# Patient Record
Sex: Female | Born: 1980 | Race: Black or African American | Hispanic: No | Marital: Single | Smoking: Never smoker
Health system: Southern US, Community
[De-identification: ages and names within clinical notes are randomized; demographics above are authoritative.]

## PROBLEM LIST (undated history)

## (undated) ENCOUNTER — Inpatient Hospital Stay (HOSPITAL_COMMUNITY): Payer: Self-pay

## (undated) DIAGNOSIS — O24419 Gestational diabetes mellitus in pregnancy, unspecified control: Secondary | ICD-10-CM

## (undated) DIAGNOSIS — B999 Unspecified infectious disease: Secondary | ICD-10-CM

## (undated) DIAGNOSIS — F32A Depression, unspecified: Secondary | ICD-10-CM

## (undated) DIAGNOSIS — G43909 Migraine, unspecified, not intractable, without status migrainosus: Secondary | ICD-10-CM

## (undated) DIAGNOSIS — R519 Headache, unspecified: Secondary | ICD-10-CM

## (undated) DIAGNOSIS — L309 Dermatitis, unspecified: Secondary | ICD-10-CM

## (undated) DIAGNOSIS — D219 Benign neoplasm of connective and other soft tissue, unspecified: Secondary | ICD-10-CM

## (undated) DIAGNOSIS — N390 Urinary tract infection, site not specified: Secondary | ICD-10-CM

## (undated) DIAGNOSIS — I10 Essential (primary) hypertension: Secondary | ICD-10-CM

## (undated) DIAGNOSIS — D573 Sickle-cell trait: Secondary | ICD-10-CM

## (undated) HISTORY — PX: BREAST SURGERY: SHX581

## (undated) HISTORY — DX: Gestational diabetes mellitus in pregnancy, unspecified control: O24.419

## (undated) HISTORY — DX: Urinary tract infection, site not specified: N39.0

## (undated) HISTORY — PX: DILATION AND CURETTAGE OF UTERUS: SHX78

## (undated) HISTORY — DX: Sickle-cell trait: D57.3

---

## 2002-07-05 ENCOUNTER — Emergency Department (HOSPITAL_COMMUNITY): Admission: EM | Admit: 2002-07-05 | Discharge: 2002-07-05 | Payer: Self-pay | Admitting: Emergency Medicine

## 2002-07-05 ENCOUNTER — Encounter: Payer: Self-pay | Admitting: Emergency Medicine

## 2003-02-24 ENCOUNTER — Emergency Department (HOSPITAL_COMMUNITY): Admission: AD | Admit: 2003-02-24 | Discharge: 2003-02-24 | Payer: Self-pay | Admitting: Emergency Medicine

## 2005-04-12 ENCOUNTER — Emergency Department (HOSPITAL_COMMUNITY): Admission: EM | Admit: 2005-04-12 | Discharge: 2005-04-13 | Payer: Self-pay | Admitting: Emergency Medicine

## 2007-01-03 ENCOUNTER — Emergency Department (HOSPITAL_COMMUNITY): Admission: EM | Admit: 2007-01-03 | Discharge: 2007-01-04 | Payer: Self-pay | Admitting: Emergency Medicine

## 2008-03-19 ENCOUNTER — Other Ambulatory Visit: Admission: RE | Admit: 2008-03-19 | Discharge: 2008-03-19 | Payer: Self-pay | Admitting: Gynecology

## 2008-04-03 ENCOUNTER — Emergency Department (HOSPITAL_COMMUNITY): Admission: EM | Admit: 2008-04-03 | Discharge: 2008-04-03 | Payer: Self-pay | Admitting: Emergency Medicine

## 2009-01-27 ENCOUNTER — Ambulatory Visit: Payer: Self-pay | Admitting: Women's Health

## 2009-02-06 ENCOUNTER — Inpatient Hospital Stay (HOSPITAL_COMMUNITY): Admission: AD | Admit: 2009-02-06 | Discharge: 2009-02-06 | Payer: Self-pay | Admitting: Obstetrics & Gynecology

## 2009-05-31 ENCOUNTER — Inpatient Hospital Stay (HOSPITAL_COMMUNITY): Admission: AD | Admit: 2009-05-31 | Discharge: 2009-05-31 | Payer: Self-pay | Admitting: Obstetrics and Gynecology

## 2009-07-12 ENCOUNTER — Inpatient Hospital Stay (HOSPITAL_COMMUNITY): Admission: AD | Admit: 2009-07-12 | Discharge: 2009-07-12 | Payer: Self-pay | Admitting: Obstetrics & Gynecology

## 2009-09-10 ENCOUNTER — Inpatient Hospital Stay (HOSPITAL_COMMUNITY): Admission: AD | Admit: 2009-09-10 | Discharge: 2009-09-12 | Payer: Self-pay | Admitting: Obstetrics & Gynecology

## 2010-04-27 ENCOUNTER — Emergency Department (HOSPITAL_COMMUNITY): Admission: EM | Admit: 2010-04-27 | Discharge: 2010-04-28 | Payer: Self-pay | Admitting: Emergency Medicine

## 2010-10-27 LAB — URINALYSIS, ROUTINE W REFLEX MICROSCOPIC
Bilirubin Urine: NEGATIVE
Glucose, UA: NEGATIVE mg/dL
Hgb urine dipstick: NEGATIVE
Ketones, ur: 15 mg/dL — AB
Leukocytes, UA: NEGATIVE
Nitrite: NEGATIVE
Protein, ur: 30 mg/dL — AB
Specific Gravity, Urine: 1.014 (ref 1.005–1.030)
Urobilinogen, UA: 0.2 mg/dL (ref 0.0–1.0)
pH: 8 (ref 5.0–8.0)

## 2010-10-27 LAB — POCT I-STAT, CHEM 8
BUN: 8 mg/dL (ref 6–23)
Calcium, Ion: 1.16 mmol/L (ref 1.12–1.32)
Chloride: 103 mEq/L (ref 96–112)
Creatinine, Ser: 0.6 mg/dL (ref 0.4–1.2)
Glucose, Bld: 129 mg/dL — ABNORMAL HIGH (ref 70–99)
HCT: 46 % (ref 36.0–46.0)
Hemoglobin: 15.6 g/dL — ABNORMAL HIGH (ref 12.0–15.0)
Potassium: 4 mEq/L (ref 3.5–5.1)
Sodium: 139 mEq/L (ref 135–145)
TCO2: 25 mmol/L (ref 0–100)

## 2010-10-27 LAB — URINE MICROSCOPIC-ADD ON

## 2010-10-27 LAB — POCT PREGNANCY, URINE: Preg Test, Ur: NEGATIVE

## 2010-10-30 LAB — CBC
HCT: 39.5 % (ref 36.0–46.0)
Hemoglobin: 11.6 g/dL — ABNORMAL LOW (ref 12.0–15.0)
MCHC: 33.7 g/dL (ref 30.0–36.0)
MCV: 94.7 fL (ref 78.0–100.0)
MCV: 95.8 fL (ref 78.0–100.0)
Platelets: 172 10*3/uL (ref 150–400)
RBC: 3.6 MIL/uL — ABNORMAL LOW (ref 3.87–5.11)
WBC: 6.9 10*3/uL (ref 4.0–10.5)

## 2010-11-16 LAB — URINALYSIS, ROUTINE W REFLEX MICROSCOPIC
Bilirubin Urine: NEGATIVE
Glucose, UA: NEGATIVE mg/dL
Ketones, ur: NEGATIVE mg/dL
Protein, ur: NEGATIVE mg/dL

## 2010-11-16 LAB — URINE MICROSCOPIC-ADD ON

## 2010-11-16 LAB — WET PREP, GENITAL: Trich, Wet Prep: NONE SEEN

## 2010-11-16 LAB — GC/CHLAMYDIA PROBE AMP, GENITAL: GC Probe Amp, Genital: NEGATIVE

## 2010-11-21 LAB — URINE MICROSCOPIC-ADD ON: RBC / HPF: NONE SEEN RBC/hpf (ref <3)

## 2010-11-21 LAB — BASIC METABOLIC PANEL
Calcium: 8.9 mg/dL (ref 8.4–10.5)
Creatinine, Ser: 0.64 mg/dL (ref 0.4–1.2)
GFR calc Af Amer: >60 mL/min (ref >60)
GFR calc non Af Amer: >60 mL/min (ref >60)
Glucose, Bld: 93 mg/dL (ref 70–99)
Sodium: 133 mEq/L — ABNORMAL LOW (ref 135–145)

## 2010-11-21 LAB — URINALYSIS, ROUTINE W REFLEX MICROSCOPIC
Glucose, UA: NEGATIVE mg/dL
Hgb urine dipstick: NEGATIVE
Ketones, ur: NEGATIVE mg/dL
Protein, ur: 100 mg/dL — AB

## 2010-11-21 LAB — GC/CHLAMYDIA PROBE AMP, GENITAL
Chlamydia, DNA Probe: NEGATIVE
GC Probe Amp, Genital: NEGATIVE

## 2010-11-21 LAB — WET PREP, GENITAL: Yeast Wet Prep HPF POC: NONE SEEN

## 2010-11-21 LAB — CBC
Hemoglobin: 12.6 g/dL (ref 12.0–15.0)
RDW: 13.6 % (ref 11.5–15.5)

## 2020-08-14 NOTE — L&D Delivery Note (Addendum)
OB/GYN Faculty Practice Delivery Note  Tammy Mcgrath is a 40 y.o. G3P1011 s/p VD at [redacted]w[redacted]d. She was admitted for IOL due to HTN.   ROM: 2h 32m with bloody fluid GBS Status: Negative/-- (11/03 0849) Maximum Maternal Temperature: 98.9  Labor Progress: Patient presented to L&D for IOL for HTN. Initial SVE: 0.5/thick/-3.Was given cytotec and pitocin and was AROMed and then progressed to complete.   Delivery Date/Time: 06/22/21 @0856  Delivery: Called to room and patient was complete and pushing. Head delivered LOA. Double nuchal cord present with single loop relieved and delivered through 2nd loop. Shoulder and body delivered in usual fashion. Infant with spontaneous cry, placed on mother's abdomen, dried and stimulated. Cord clamped x 2 after 1-minute delay, and cut by father of baby. Cord blood drawn. Placenta delivered spontaneously with gentle cord traction. Fundus firm with massage and Pitocin. Labia, perineum, vagina, and cervix inspected with bilateral periurethral lacerations and 1st degree perineal laceration which required repair. Placenta was examined after and found to be intact with normal 3-vessel cord.   Placenta: Intact 3VC Complications: None Lacerations: 1st degree perineal repaired with 3-0 Vicryl figure of eight suture, bilateral periurethral abrasions with left side repaired using 4-0 monocryl simple sutures EBL: Analgesia: Epidural   Infant: APGARs 8/9  pending weight  10/9, DO 06/22/2021, 9:50 AM PGY-1, [facility]  GME ATTESTATION:  I saw and evaluated the patient. I agree with the findings and the plan of care as documented in the resident's note and have made all necessary edits.  13/04/2021, MD, MPH OB Fellow, Faculty Practice [facility], Center for [facility] 06/22/2021 10:08 AM

## 2020-11-07 ENCOUNTER — Encounter (HOSPITAL_COMMUNITY): Payer: Self-pay | Admitting: Emergency Medicine

## 2020-11-07 ENCOUNTER — Inpatient Hospital Stay (HOSPITAL_COMMUNITY)
Admission: EM | Admit: 2020-11-07 | Discharge: 2020-11-07 | Disposition: A | Discharge status: Home / Self Care | Payer: Medicaid - Out of State | Attending: Obstetrics & Gynecology | Admitting: Obstetrics & Gynecology

## 2020-11-07 ENCOUNTER — Other Ambulatory Visit: Payer: Self-pay

## 2020-11-07 ENCOUNTER — Inpatient Hospital Stay (HOSPITAL_COMMUNITY): Payer: Medicaid - Out of State

## 2020-11-07 DIAGNOSIS — O23591 Infection of other part of genital tract in pregnancy, first trimester: Secondary | ICD-10-CM | POA: Diagnosis not present

## 2020-11-07 DIAGNOSIS — O4691 Antepartum hemorrhage, unspecified, first trimester: Secondary | ICD-10-CM

## 2020-11-07 DIAGNOSIS — R109 Unspecified abdominal pain: Secondary | ICD-10-CM | POA: Diagnosis present

## 2020-11-07 DIAGNOSIS — Z6790 Unspecified blood type, Rh positive: Secondary | ICD-10-CM | POA: Diagnosis not present

## 2020-11-07 DIAGNOSIS — O99331 Smoking (tobacco) complicating pregnancy, first trimester: Secondary | ICD-10-CM | POA: Insufficient documentation

## 2020-11-07 DIAGNOSIS — O209 Hemorrhage in early pregnancy, unspecified: Secondary | ICD-10-CM | POA: Diagnosis present

## 2020-11-07 DIAGNOSIS — B9689 Other specified bacterial agents as the cause of diseases classified elsewhere: Secondary | ICD-10-CM | POA: Insufficient documentation

## 2020-11-07 DIAGNOSIS — O30041 Twin pregnancy, dichorionic/diamniotic, first trimester: Secondary | ICD-10-CM | POA: Insufficient documentation

## 2020-11-07 DIAGNOSIS — Z3A08 8 weeks gestation of pregnancy: Secondary | ICD-10-CM | POA: Insufficient documentation

## 2020-11-07 DIAGNOSIS — O09521 Supervision of elderly multigravida, first trimester: Secondary | ICD-10-CM | POA: Diagnosis not present

## 2020-11-07 DIAGNOSIS — F17200 Nicotine dependence, unspecified, uncomplicated: Secondary | ICD-10-CM | POA: Diagnosis not present

## 2020-11-07 DIAGNOSIS — O4690 Antepartum hemorrhage, unspecified, unspecified trimester: Secondary | ICD-10-CM

## 2020-11-07 DIAGNOSIS — N760 Acute vaginitis: Secondary | ICD-10-CM | POA: Diagnosis not present

## 2020-11-07 LAB — URINALYSIS, ROUTINE W REFLEX MICROSCOPIC
Bilirubin Urine: NEGATIVE
Glucose, UA: NEGATIVE mg/dL
Ketones, ur: NEGATIVE mg/dL
Nitrite: NEGATIVE
Protein, ur: NEGATIVE mg/dL
Specific Gravity, Urine: 1.012 (ref 1.005–1.030)
pH: 5 (ref 5.0–8.0)

## 2020-11-07 LAB — CBC
HCT: 35.4 % — ABNORMAL LOW (ref 36.0–46.0)
Hemoglobin: 12.1 g/dL (ref 12.0–15.0)
MCH: 27.2 pg (ref 26.0–34.0)
MCHC: 34.2 g/dL (ref 30.0–36.0)
MCV: 79.6 fL — ABNORMAL LOW (ref 80.0–100.0)
Platelets: 259 10*3/uL (ref 150–400)
RBC: 4.45 MIL/uL (ref 3.87–5.11)
RDW: 15.9 % — ABNORMAL HIGH (ref 11.5–15.5)
WBC: 5 10*3/uL (ref 4.0–10.5)
nRBC: 0 % (ref 0.0–0.2)

## 2020-11-07 LAB — WET PREP, GENITAL
Sperm: NONE SEEN
Trich, Wet Prep: NONE SEEN
Yeast Wet Prep HPF POC: NONE SEEN

## 2020-11-07 LAB — HCG, QUANTITATIVE, PREGNANCY: hCG, Beta Chain, Quant, S: 7691 m[IU]/mL — ABNORMAL HIGH (ref <5)

## 2020-11-07 LAB — I-STAT BETA HCG BLOOD, ED (MC, WL, AP ONLY): I-stat hCG, quantitative: >2000 m[IU]/mL — ABNORMAL HIGH (ref <5)

## 2020-11-07 MED ORDER — METRONIDAZOLE 500 MG PO TABS: 500.0000 mg | ORAL_TABLET | Freq: Two times a day (BID) | ORAL | 0 refills | Status: DC

## 2020-11-07 NOTE — ED Triage Notes (Signed)
Pt. Stated, I was positive for being pregnant and last night started spotting and having abdominal cramping. Per pt and boyfriend she is hard of hearing. Boyfriend with her.

## 2020-11-07 NOTE — MAU Note (Signed)
Pt reports to mau with c/o brown vag dc and itching. Also reports lower abd cramping.

## 2020-11-07 NOTE — MAU Provider Note (Signed)
History     CSN: [id number]  Arrival date and time: 11/07/20 3220  Chief Complaint  Patient presents with  . Vaginal Bleeding  . Abdominal Pain  . Abdominal Cramping   40 y.o. G2P1001 @[redacted]w[redacted]d  by LMP presenting with with vaginal discharge and abd cramping. Reports onset last night. Describes discharge as brown and small amt. No recent IC. Denies vaginal malodor but reports itching she thinks is eczema. Cramping located in lower abdomen, intermittent and rates 3/10. Has not taken anything for it.   OB History    Gravida  2   Para  1   Term  1   Preterm      AB      Living        SAB      IAB      Ectopic      Multiple      Live Births              History reviewed. No pertinent past medical history.  History reviewed. No pertinent surgical history.  History reviewed. No pertinent family history.  Social History   Tobacco Use  . Smoking status: Current Every Day Smoker  . Smokeless tobacco: Never Used  Substance Use Topics  . Alcohol use: Yes  . Drug use: Not Currently    Allergies: Not on File  Medications Prior to Admission  Medication Sig Dispense Refill Last Dose  . Prenatal Vit-Fe Fumarate-FA (MULTIVITAMIN-PRENATAL) 27-0.8 MG TABS tablet Take 1 tablet by mouth daily at 12 noon.   11/06/2020 at Unknown time    Review of Systems  Constitutional: Negative for chills and fever.  Gastrointestinal: Positive for abdominal pain. Negative for constipation, diarrhea, nausea and vomiting.  Genitourinary: Positive for vaginal bleeding and vaginal discharge. Negative for dysuria, frequency and urgency.   Physical Exam   Blood pressure (!) 137/93, pulse 82, temperature 98.4 F (36.9 C), resp. rate 16, last menstrual period 09/09/2020, SpO2 100 %.  Physical Exam Vitals and nursing note reviewed. Exam conducted with a chaperone present.  Constitutional:      General: She is not in acute distress.    Appearance: Normal appearance.  HENT:     Head:  Normocephalic and atraumatic.  Cardiovascular:     Rate and Rhythm: Normal rate.  Pulmonary:     Effort: Pulmonary effort is normal. No respiratory distress.  Abdominal:     General: There is no distension.     Palpations: Abdomen is soft. There is no mass.     Tenderness: There is no abdominal tenderness. There is no guarding or rebound.     Hernia: No hernia is present.  Genitourinary:    Comments: External: no lesions or erythema Vagina: rugated, pink, moist, yellow purulent discharge, small amt of bleeding with q-tip contact with cervix Uterus: non enlarged, anteverted, non tender, no CMT Adnexae: no masses, no tenderness left, no tenderness right Cervix closed  Musculoskeletal:        General: Normal range of motion.     Cervical back: Normal range of motion.  Skin:    General: Skin is warm and dry.  Neurological:     General: No focal deficit present.     Mental Status: She is alert and oriented to person, place, and time.  Psychiatric:        Mood and Affect: Mood normal.        Behavior: Behavior normal.    Results for orders placed or performed during the hospital  encounter of 11/07/20 (from the past 24 hour(s))  I-Stat Beta hCG blood, ED ([facility], [facility], [facility] only)     Status: Abnormal   Collection Time: 11/07/20  9:42 AM  Result Value Ref Range   I-stat hCG, quantitative >2,000.0 (H) <5 mIU/mL   Comment 3          ABO/Rh     Status: None   Collection Time: 11/07/20 10:51 AM  Result Value Ref Range   ABO/RH(D)      B POS Performed at [facility], [address], [place], [place] [address]   CBC     Status: Abnormal   Collection Time: 11/07/20 10:51 AM  Result Value Ref Range   WBC 5.0 4.0 - 10.5 K/uL   RBC 4.45 3.87 - 5.11 MIL/uL   Hemoglobin 12.1 12.0 - 15.0 g/dL   HCT 50.5 (L) 39.7 - 67.3 %   MCV 79.6 (L) 80.0 - 100.0 fL   MCH 27.2 26.0 - 34.0 pg   MCHC 34.2 30.0 - 36.0 g/dL   RDW 41.9 (H) 37.9 - 02.4 %   Platelets 259 150 - 400 K/uL   nRBC 0.0 0.0 -  0.2 %  hCG, quantitative, pregnancy     Status: Abnormal   Collection Time: 11/07/20 10:51 AM  Result Value Ref Range   hCG, Beta Chain, Quant, S 7,691 (H) <5 mIU/mL  Wet prep, genital     Status: Abnormal   Collection Time: 11/07/20 11:09 AM   Specimen: Vaginal  Result Value Ref Range   Yeast Wet Prep HPF POC NONE SEEN NONE SEEN   Trich, Wet Prep NONE SEEN NONE SEEN   Clue Cells Wet Prep HPF POC PRESENT (A) NONE SEEN   WBC, Wet Prep HPF POC MANY (A) NONE SEEN   Sperm NONE SEEN   Urinalysis, Routine w reflex microscopic Urine, Clean Catch     Status: Abnormal   Collection Time: 11/07/20 11:18 AM  Result Value Ref Range   Color, Urine YELLOW YELLOW   APPearance HAZY (A) CLEAR   Specific Gravity, Urine 1.012 1.005 - 1.030   pH 5.0 5.0 - 8.0   Glucose, UA NEGATIVE NEGATIVE mg/dL   Hgb urine dipstick SMALL (A) NEGATIVE   Bilirubin Urine NEGATIVE NEGATIVE   Ketones, ur NEGATIVE NEGATIVE mg/dL   Protein, ur NEGATIVE NEGATIVE mg/dL   Nitrite NEGATIVE NEGATIVE   Leukocytes,Ua SMALL (A) NEGATIVE   RBC / HPF 0-5 0 - 5 RBC/hpf   WBC, UA 0-5 0 - 5 WBC/hpf   Bacteria, UA RARE (A) NONE SEEN   Squamous Epithelial / LPF 0-5 0 - 5   US OB LESS THAN 14 WEEKS WITH OB TRANSVAGINAL  Result Date: 11/07/2020 CLINICAL DATA:  Early pregnancy with vaginal bleeding. EXAM: TWIN OBSTETRIC <14WK [place] AND TRANSVAGINAL OB US COMPARISON:  None. FINDINGS: Number of IUPs:  2 Chorionicity/Amnionicity:  Dichorionic-diamniotic (thick membrane) TWIN 1 Yolk sac:  Yes Embryo:  No Cardiac Activity: No MSD: 6.9 mm   5 w   2 d TWIN 2 Yolk sac:  Not Visualized. Embryo:  Not Visualized. Cardiac Activity: Not Visualized. MSD: 3.6 mm   5 w   0 d Subchorionic hemorrhage:  None visualized. Maternal uterus/adnexae: Subchorionic hemorrhage: None Right ovary: Appears normal measuring 3.3 x 2.0 x 1.6 cm (volume = 5.5 cm^3). Left ovary: 5.3 x 2.7 x 2.7 cm (volume = 20 cm^3) Other :Anterior, exophytic, subserosal fundal fibroid measures 4  x 3.8 x 3.0 cm Posterior, intramural,  fundal fibroid measures 2 x 1.8 x 1.7 cm. Free fluid:  None IMPRESSION: 1. Two endometrial gestational sacs are identified compatible with probable dichorionic diamniotic twin gestation. 2. Twin gestational sac #1 contains a yolk sac without embryo. Twin gestational sac #2 without yolk sac or fetal pole. Recommend follow-up quantitative B-HCG levels and follow-up US in 14 days to assess viability. This recommendation follows SRU consensus guidelines: Diagnostic Criteria for Nonviable Pregnancy Early in the First Trimester. Malva Limes Med 2013; 694:8546-27. 3. Exophytic and intramural fundal fibroids as described above. Electronically Signed   By: Signa Kell M.D.   On: 11/07/2020 13:24   MAU Course  Procedures  MDM Labs and [place] ordered and reviewed. US shows early IUP with 2 5week IUGS. Recommend f/u US in 2 weeks for viability. Will treat BV. GC pending. Stable for discharge home.   Assessment and Plan   1. Dichorionic diamniotic twin pregnancy in first trimester   2. Vaginal bleeding in pregnancy   3. Blood type, Rh positive   4. Bacterial vaginosis    Discharge home [facility] at [facility] [place] in 2 weeks  SAB precautions Rx Flagyl OB provider list given  Allergies as of 11/07/2020   Not on File     Medication List    TAKE these medications   metroNIDAZOLE 500 MG tablet Commonly known as: FLAGYL Take 1 tablet (500 mg total) by mouth 2 (two) times daily.   multivitamin-prenatal 27-0.8 MG Tabs tablet Take 1 tablet by mouth daily at 12 noon.       Donette Larry, CNM 11/07/2020, 2:04 PM

## 2020-11-07 NOTE — Discharge Instructions (Signed)
Bacterial Vaginosis  Bacterial vaginosis is an infection of the vagina. It happens when too many normal germs (healthy bacteria) grow in the vagina. This infection can make it easier to get other infections from sex (STIs). It is very important for pregnant women to get treated. This infection can cause babies to be born early or at a low birth weight. What are the causes? This infection is caused by an increase in certain germs that grow in the vagina. You cannot get this infection from toilet seats, bedsheets, swimming pools, or things that touch your vagina. What increases the risk?  Having sex with a new person or more than one person.  Having sex without protection.  Douching.  Having an intrauterine device (IUD).  Smoking.  Using drugs or drinking alcohol. These can lead you to do things that are risky.  Taking certain antibiotic medicines.  Being pregnant. What are the signs or symptoms? Some women have no symptoms. Symptoms may include:  A discharge from your vagina. It may be gray or white. It can be watery or foamy.  A fishy smell. This can happen after sex or during your menstrual period.  Itching in and around your vagina.  A feeling of burning or pain when you pee (urinate). How is this treated? This infection is treated with antibiotic medicines. These may be given to you as:  A pill.  A cream for your vagina.  A medicine that you put into your vagina (suppository). If the infection comes back after treatment, you may need more antibiotics. Follow these instructions at home: Medicines  Take over-the-counter and prescription medicines as told by your doctor.  Take or use your antibiotic medicine as told by your doctor. Do not stop taking or using it, even if you start to feel better. General instructions  If the person you have sex with is a woman, tell her that you have this infection. She will need to follow up with her doctor. If you have a female  partner, he does not need to be treated.  Do not have sex until you finish treatment.  Drink enough fluid to keep your pee pale yellow.  Keep your vagina and butt clean. ? Wash the area with warm water each day. ? Wipe from front to back after you use the toilet.  If you are breastfeeding a baby, ask your doctor if you should keep doing so during treatment.  Keep all follow-up visits. How is this prevented? Self-care  Do not douche.  Use only warm water to wash around your vagina.  Wear underwear that is cotton or lined with cotton.  Do not wear tight pants and pantyhose, especially in the summer. Safe sex  Use protection when you have sex. This includes: ? Use condoms. ? Use dental dams. This is a thin layer that protects the mouth during oral sex.  Limit how many people you have sex with. To prevent this infection, it is best to have sex with just one person.  Get tested for STIs. The person you have sex with should also get tested. Drugs and alcohol  Do not smoke or use any products that contain nicotine or tobacco. If you need help quitting, ask your doctor.  Do not use drugs.  Do not drink alcohol if: ? Your doctor tells you not to drink. ? You are pregnant, may be pregnant, or are planning to become pregnant.  If you drink alcohol: ? Limit how much you have to 0-1 drink   a day. ? Know how much alcohol is in your drink. In the [place] one drink equals one 12 oz bottle of beer (355 mL), one 5 oz glass of wine (148 mL), or one 1 oz glass of hard liquor (44 mL). Where to find more information  Centers for Disease Control and Prevention: FootballExhibition.com.br  American Sexual Health Association: www.ashastd.org  Office on [employer] Health: http://hoffman.com/ Contact a doctor if:  Your symptoms do not get better, even after you are treated.  You have more discharge or pain when you pee.  You have a fever or chills.  You have pain in your belly (abdomen) or in the area  between your hips.  You have pain with sex.  You bleed from your vagina between menstrual periods. Summary  This infection can happen when too many germs (bacteria) grow in the vagina.  This infection can make it easier to get infections from sex (STIs). Treating this can lower that chance.  Get treated if you are pregnant. This infection can cause babies to be born early.  Do not stop taking or using your antibiotic medicine, even if you start to feel better. This information is not intended to replace advice given to you by your health care provider. Make sure you discuss any questions you have with your health care provider. Document Revised: 01/29/2020 Document Reviewed: 01/29/2020 Elsevier Patient Education  2021 Elsevier Inc.  [place] [facility] Providers    Center for [employer] at [employer] [facility]      Phone: [phone]  Center for [employer] at [place]    Phone: [phone]  Center for [employer] at [place]   Phone: [phone]  Center for [employer] at [employer]   Phone: [phone]  Center for [employer] at [place]   Phone: [phone]  Center for [facility] at [facility]    Phone: [phone]  [facility]        Phone: [phone]  [facility]     Phone: [phone]   [facility]     Phone: [phone]  [facility]     Phone: [phone]  [facility]     Phone: [phone]  [facility]        Phone: [phone]   [facility]-Maternity   Phone: [phone]  [facility]     Phone: [phone]  [facility]    Phone: [phone]  [facility]       Phone: [phone]  [facility]     Phone: [phone]

## 2020-11-07 NOTE — ED Provider Notes (Signed)
Emergency Medicine Provider OB Triage Evaluation Note  Tammy Mcgrath is a 40 y.o. female, No obstetric history on file., at Unknown gestation who presents to the emergency department with complaints of pelvic cramping and brown vaginal discharge today. Has had 3 positive home pregancy tests at home. LMP 2/1.  Review of  Systems  Positive: vaginal discharge, pelvic cramping Negative: fever  Physical Exam  BP (!) 132/96 (BP Location: Left Arm)   Pulse 89   Temp 98.5 F (36.9 C) (Oral)   Resp 16   LMP 09/14/2020   SpO2 100%  General: Awake, no distress  HEENT: Atraumatic  Resp: Normal effort  Cardiac: Normal rate Abd: Nondistended, nontender  MSK: Moves all extremities without difficulty Neuro: Speech clear  Medical Decision Making  Pt evaluated for pregnancy concern and is stable for transfer to MAU. Pt is in agreement with plan for transfer.  9:55 AM Discussed with MAU APP, Shawna Orleans, who accepts patient in transfer.  Clinical Impression  No diagnosis found.     Karrie Meres, PA-C 11/07/20 0957    Jacalyn Lefevre, MD 11/07/20 1049

## 2020-11-08 LAB — GC/CHLAMYDIA PROBE AMP (~~LOC~~) NOT AT ARMC
Chlamydia: NEGATIVE
Comment: NEGATIVE
Comment: NORMAL
Neisseria Gonorrhea: NEGATIVE

## 2020-11-08 LAB — ABO/RH: ABO/RH(D): B POS

## 2020-11-30 ENCOUNTER — Inpatient Hospital Stay (HOSPITAL_COMMUNITY)
Admission: EM | Admit: 2020-11-30 | Discharge: 2020-11-30 | Disposition: A | Discharge status: Home / Self Care | Payer: 59 | Attending: Obstetrics and Gynecology | Admitting: Obstetrics and Gynecology

## 2020-11-30 ENCOUNTER — Other Ambulatory Visit: Payer: Self-pay

## 2020-11-30 ENCOUNTER — Inpatient Hospital Stay (HOSPITAL_COMMUNITY): Payer: 59

## 2020-11-30 ENCOUNTER — Encounter (HOSPITAL_COMMUNITY): Payer: Self-pay | Admitting: Obstetrics and Gynecology

## 2020-11-30 DIAGNOSIS — O3111X2 Continuing pregnancy after spontaneous abortion of one fetus or more, first trimester, fetus 2: Secondary | ICD-10-CM | POA: Diagnosis not present

## 2020-11-30 DIAGNOSIS — O26899 Other specified pregnancy related conditions, unspecified trimester: Secondary | ICD-10-CM

## 2020-11-30 DIAGNOSIS — O30041 Twin pregnancy, dichorionic/diamniotic, first trimester: Secondary | ICD-10-CM | POA: Diagnosis not present

## 2020-11-30 DIAGNOSIS — Z3A08 8 weeks gestation of pregnancy: Secondary | ICD-10-CM

## 2020-11-30 DIAGNOSIS — O3121X2 Continuing pregnancy after intrauterine death of one fetus or more, first trimester, fetus 2: Secondary | ICD-10-CM

## 2020-11-30 DIAGNOSIS — O99351 Diseases of the nervous system complicating pregnancy, first trimester: Secondary | ICD-10-CM | POA: Diagnosis not present

## 2020-11-30 DIAGNOSIS — O210 Mild hyperemesis gravidarum: Secondary | ICD-10-CM | POA: Diagnosis present

## 2020-11-30 DIAGNOSIS — G43909 Migraine, unspecified, not intractable, without status migrainosus: Secondary | ICD-10-CM

## 2020-11-30 DIAGNOSIS — F4321 Adjustment disorder with depressed mood: Secondary | ICD-10-CM

## 2020-11-30 DIAGNOSIS — O10011 Pre-existing essential hypertension complicating pregnancy, first trimester: Secondary | ICD-10-CM | POA: Diagnosis not present

## 2020-11-30 DIAGNOSIS — O26891 Other specified pregnancy related conditions, first trimester: Secondary | ICD-10-CM

## 2020-11-30 DIAGNOSIS — R109 Unspecified abdominal pain: Secondary | ICD-10-CM

## 2020-11-30 DIAGNOSIS — G43009 Migraine without aura, not intractable, without status migrainosus: Secondary | ICD-10-CM

## 2020-11-30 DIAGNOSIS — O219 Vomiting of pregnancy, unspecified: Secondary | ICD-10-CM

## 2020-11-30 DIAGNOSIS — O10911 Unspecified pre-existing hypertension complicating pregnancy, first trimester: Secondary | ICD-10-CM

## 2020-11-30 DIAGNOSIS — Z3A11 11 weeks gestation of pregnancy: Secondary | ICD-10-CM

## 2020-11-30 HISTORY — DX: Unspecified infectious disease: B99.9

## 2020-11-30 HISTORY — DX: Migraine, unspecified, not intractable, without status migrainosus: G43.909

## 2020-11-30 HISTORY — DX: Essential (primary) hypertension: I10

## 2020-11-30 HISTORY — DX: Dermatitis, unspecified: L30.9

## 2020-11-30 HISTORY — DX: Headache, unspecified: R51.9

## 2020-11-30 HISTORY — DX: Depression, unspecified: F32.A

## 2020-11-30 LAB — URINALYSIS, ROUTINE W REFLEX MICROSCOPIC
Bilirubin Urine: NEGATIVE
Glucose, UA: 50 mg/dL — AB
Hgb urine dipstick: NEGATIVE
Ketones, ur: 5 mg/dL — AB
Leukocytes,Ua: NEGATIVE
Nitrite: NEGATIVE
Protein, ur: NEGATIVE mg/dL
Specific Gravity, Urine: 1.014 (ref 1.005–1.030)
pH: 6 (ref 5.0–8.0)

## 2020-11-30 MED ORDER — CYCLOBENZAPRINE HCL 5 MG PO TABS
10.0000 mg | ORAL_TABLET | Freq: Once | ORAL | Status: AC
  Administered 2020-11-30: 10 mg via ORAL
  Filled 2020-11-30: qty 2

## 2020-11-30 MED ORDER — ACETAMINOPHEN 500 MG PO TABS
1000.0000 mg | ORAL_TABLET | Freq: Once | ORAL | Status: AC
  Administered 2020-11-30: 1000 mg via ORAL
  Filled 2020-11-30: qty 2

## 2020-11-30 MED ORDER — LABETALOL HCL 200 MG PO TABS: 200.0000 mg | ORAL_TABLET | Freq: Two times a day (BID) | ORAL | 0 refills | Status: DC

## 2020-11-30 MED ORDER — METOCLOPRAMIDE HCL 10 MG PO TABS: 10.0000 mg | ORAL_TABLET | Freq: Three times a day (TID) | ORAL | 0 refills | Status: DC | PRN

## 2020-11-30 MED ORDER — PROMETHAZINE HCL 25 MG PO TABS
25.0000 mg | ORAL_TABLET | Freq: Once | ORAL | Status: AC
  Administered 2020-11-30: 25 mg via ORAL
  Filled 2020-11-30: qty 1

## 2020-11-30 MED ORDER — CYCLOBENZAPRINE HCL 5 MG PO TABS: 5.0000 mg | ORAL_TABLET | Freq: Three times a day (TID) | ORAL | 0 refills | Status: DC | PRN

## 2020-11-30 NOTE — MAU Note (Signed)
Tammy Mcgrath is a 40 y.o. at [redacted]w[redacted]d here in MAU reporting: headache for the past few days. Also reporting nausea and dry heaving and lower abdominal pain.   Onset of complaint: ongoing  Pain score: headache 6/10, cramping 4/10  Vitals:   11/30/20 0843 11/30/20 0931  BP: (!) 153/109 (!) 141/89  Pulse: 93 95  Resp: 16 16  Temp:  98.1 F (36.7 C)  SpO2: 100% 99%     Lab orders placed from triage: UA

## 2020-11-30 NOTE — MAU Note (Signed)
Discharge instructions reviewed with significant other and verbalizes understanding.

## 2020-11-30 NOTE — Discharge Instructions (Signed)
[facility] [employer] for [employer] at [facility]  [address], [place], [place] [address]  [phone]  Center for [facility] at [facility]  [address], [place], [place] [address]  [phone]  Center for [facility] at [facility] [address], [place], [place] [address]  [phone]  Center for [facility] at [facility]  [address] [place], [place] [address]  [phone]  Center for [facility] at [facility]  [address]First [address]), [place], [place] [address]  [phone]  Center for [facility] [address], [place], [place] [address] [phone]  Center for [facility] at [facility]  [address] [place], [place], [place] [address]  [phone]  [facility]  [address], [place], [place] [address]  [phone]  [facility]  [address] [place], [place], [place] [address]  [phone]  [facility]  [address] [place], [place] [address]  [phone]  [facility]  [address], [place], [place] [address]  [phone]  [facility]  [address], [place], [place] [address]  [phone]  [facility]   [address] [place], [place] [address]  [phone]  [facility]  [address], [place], [place] [address]   [phone]  [facility]  [address] [place], [place] [address]  [phone]          For prevention of migraines in pregnancy: -Magnesium, 400mg  by mouth, once daily -Vitamin B2, 400mg  by mouth, once daily  For treatment of migraines in pregnancy: -take medication at the first sign of the pain of a headache, or the first sign of your aura -start with 1000mg  Tylenol (do not exceed 4000mg  of Tylenol in 24hrs), with or without Reglan 10mg  -if no relief after 1-2hours, can take Flexeril  10mg        Hypertension During Pregnancy High blood pressure (hypertension) is when the force of blood pumping through the arteries is high enough to cause problems with your health. Arteries are blood vessels that carry blood from the heart throughout the body. Hypertension during pregnancy can cause problems for you and your baby. It can be mild or severe. There are different types of hypertension that can happen during pregnancy. These include:  Chronic hypertension. This happens when you had high blood pressure before you became pregnant, and it continues during the pregnancy. Hypertension that develops before you are [redacted] weeks pregnant and continues during the pregnancy is also called chronic hypertension. If you have chronic hypertension, it will not go away after you have your baby. You will need follow-up visits with your health care provider after you have your baby. Your health care provider may want you to keep taking medicine for your blood pressure.  Gestational hypertension. This is hypertension that develops after the 20th week of pregnancy. Gestational hypertension usually goes away after you have your baby, but your health care provider will need to monitor your blood pressure to make sure that it is getting better.  Postpartum hypertension. This is high blood pressure that was present before delivery and continues after delivery or that starts after delivery. This usually occurs within 48 hours after childbirth but may occur up to 6 weeks after giving birth. When hypertension during pregnancy is severe, it is a medical emergency that requires treatment  right away. How does this affect me? Women who have hypertension during pregnancy have a greater chance of developing hypertension later in life or during future pregnancies. In some cases, hypertension during pregnancy can cause serious complications, such as:  Stroke.  Heart attack.  Injury to other organs, such as kidneys,  lungs, or liver.  Preeclampsia.  A condition called hemolysis, elevated liver enzymes, and low platelet count (HELLP) syndrome.  Convulsions or seizures.  Placental abruption. How does this affect my baby? Hypertension during pregnancy can affect your baby. Your baby may:  Be born early (prematurely).  Not weigh as much as he or she should at birth (low birth weight).  Not tolerate labor well, leading to an unplanned cesarean delivery. This condition may also result in a baby's death before birth (stillbirth). What are the risks? There are certain factors that make it more likely for you to develop hypertension during pregnancy. These include:  Having hypertension during a previous pregnancy or a family history of hypertension.  Being overweight.  Being age 75 or older.  Being pregnant for the first time.  Being pregnant with more than one baby.  Becoming pregnant using fertilization methods, such as IVF (in vitro fertilization).  Having other medical problems, such as diabetes, kidney disease, or lupus. What can I do to lower my risk? The exact cause of hypertension during pregnancy is not known. You may be able to lower your risk by:  Maintaining a healthy weight.  Eating a healthy and balanced diet.  Following your health care provider's instructions about treating any long-term conditions that you had before becoming pregnant. It is very important to keep all of your prenatal care appointments. Your health care provider will check your blood pressure and make sure that your pregnancy is progressing as expected. If a problem is found, early treatment can prevent complications.   How is this treated? Treatment for hypertension during pregnancy varies depending on the type of hypertension you have and how serious it is.  If you were taking medicine for high blood pressure before you became pregnant, talk with your health care provider. You may need to change medicine  during pregnancy because some medicines, like ACE inhibitors, may not be considered safe for your baby.  If you have gestational hypertension, your health care provider may order medicine to treat this during pregnancy.  If you are at risk for preeclampsia, your health care provider may recommend that you take a low-dose aspirin during your pregnancy.  If you have severe hypertension, you may need to be hospitalized so you and your baby can be monitored closely. You may also need to be given medicine to lower your blood pressure.  In some cases, if your condition gets worse, you may need to deliver your baby early. Follow these instructions at home: Eating and drinking  Drink enough fluid to keep your urine pale yellow.  Avoid caffeine.   Lifestyle  Do not use any products that contain nicotine or tobacco. These products include cigarettes, chewing tobacco, and vaping devices, such as e-cigarettes. If you need help quitting, ask your health care provider.  Do not use alcohol or drugs.  Avoid stress as much as possible.  Rest and get plenty of sleep.  Regular exercise can help to reduce your blood pressure. Ask your health care provider what kinds of exercise are best for you. General instructions  Take over-the-counter and prescription medicines only as told by your health care provider.  Keep all prenatal  and follow-up visits. This is important. Contact a health care provider if:  You have symptoms that your health care provider told you may require more treatment or monitoring, such as: ? Headaches. ? Nausea or vomiting. ? Abdominal pain. ? Dizziness. ? Light-headedness. Get help right away if:  You have symptoms of serious complications, such as: ? Severe abdominal pain that does not get better with treatment. ? A severe headache that does not get better, blurred vision, or double vision. ? Vomiting that does not get better. ? Sudden, rapid weight gain or swelling in  your hands, ankles, or face. ? Vaginal bleeding. ? Blood in your urine. ? Shortness of breath or chest pain. ? Weakness on one side of your body or difficulty speaking.  Your baby is not moving as much as usual. These symptoms may represent a serious problem that is an emergency. Do not wait to see if the symptoms will go away. Get medical help right away. Call your local emergency services (911 in the [place] Do not drive yourself to the hospital. Summary  Hypertension during pregnancy can cause problems for you and your baby.  Treatment for hypertension during pregnancy varies depending on the type of hypertension you have and how serious it is.  Keep all prenatal and follow-up visits. This is important.  Get help right away if you have symptoms of serious complications related to high blood pressure. This information is not intended to replace advice given to you by your health care provider. Make sure you discuss any questions you have with your health care provider. Document Revised: 04/22/2020 Document Reviewed: 04/22/2020 Elsevier Patient Education  2021 [employer].

## 2020-11-30 NOTE — MAU Note (Signed)
Chaplain paged  

## 2020-11-30 NOTE — ED Provider Notes (Signed)
Emergency Medicine Provider OB Triage Evaluation Note  Tammy Mcgrath is a 40 y.o. female, G3P1000, at 2 months pregnant who presents to the emergency department with complaints of headache.  Review of  Systems  Positive: headache, hearing changes, nausea, dryheave Negative: fever, neck stiffness, abd pain, vaginal bleeding or discharge  Physical Exam  BP (!) 153/109   Pulse 95   Temp 98.6 F (37 C) (Oral)   Resp 16   LMP 09/09/2020   SpO2 100%  General: Awake, tearful, hard of hearing HEENT: Atraumatic  Resp: Normal effort  Cardiac: Normal rate Abd: Nondistended, nontender  MSK: Moves all extremities without difficulty Neuro: Speech clear  Medical Decision Making  Pt evaluated for pregnancy concern and is stable for transfer to MAU. Pt is in agreement with plan for transfer.  8:45 AM Discussed with MAU APP, who accepts patient in transfer.  Clinical Impression  No diagnosis found.     Fayrene Helper, PA-C 11/30/20 9179    Jacalyn Lefevre, MD 11/30/20 [phone]

## 2020-11-30 NOTE — Progress Notes (Signed)
This chaplain responded to a page for spiritual care from RN-Nicole after the loss of one of the Pt. twins. The chaplain checked in with the medical team before the visit.   The Pt. support person is present at the bedside. The chaplain offered a listening presence.  The Pt. prefers space to remain silent.  The chaplain confirmed the Pt. request for absence letter from work with the RN.  The Pt. hopes to be discharged as soon as possible. The Pt. support person accepted the chaplain's invitation for prayer and the Pt. declined.   The chaplain is available for F/U spiritual care as needed.

## 2020-11-30 NOTE — MAU Note (Signed)
Chaplain at bedside with patient.

## 2020-11-30 NOTE — MAU Provider Note (Signed)
History     CSN: [id number]  Arrival date and time: 11/30/20 2409   Event Date/Time   First Provider Initiated Contact with Patient 11/30/20 1008      Chief Complaint  Patient presents with  . Abdominal Pain  . Headache  . Nausea   HPI Tammy Mcgrath is a 40 y.o. G3P1011 at [redacted]w[redacted]d by LMP who presents with migraine, n/v, and abdominal cramping. Was seen in MAU last month & had ultrasound that showed di/di twins, 1 GS with yolk sac and 1 GS was empty. Scheduled for ultrasound next week for viability.  Has history of migraines. Current headache started a few days ago. Took tylenol yesterday without relief. Has history of migraines & reports that she normally treated with ibuprofen or water & nap. Endorses photophobia. Rates pain 6/10.  Has had abdominal cramping that last few days. Has daily bowel movements but reports that they are small & hard. Hasn't been treating constipation. Rates cramping 4/10. Denies vaginal bleeding, dysuria, fever, or vaginal discharge.  Has had nausea & vomiting with the pregnancy. Has not treated at home. Nausea worse with headache.  Has not started prenatal care.  Reports elevated BPs at home for the last year but hasn't had official hypertension diagnosis.   OB History    Gravida  3   Para  1   Term  1   Preterm      AB  1   Living  1     SAB  1   IAB      Ectopic      Multiple      Live Births  1           Past Medical History:  Diagnosis Date  . Chronic hypertension   . Depression    not a formal dx, "is sad, not suicidal"  . Eczema   . Headache   . Infection    UTI  . Migraines     Past Surgical History:  Procedure Laterality Date  . BREAST SURGERY     reduction  . DILATION AND CURETTAGE OF UTERUS      Family History  Problem Relation Age of Onset  . Hypertension Father   . Stroke Father     Social History   Tobacco Use  . Smoking status: Never Smoker  . Smokeless tobacco: Never Used  Vaping Use  .  Vaping Use: Never used  Substance Use Topics  . Alcohol use: Not Currently  . Drug use: Not Currently    Types: Marijuana    Comment: last was ~3 months ago    Allergies: No Known Allergies  Medications Prior to Admission  Medication Sig Dispense Refill Last Dose  . polyethylene glycol (MIRALAX / GLYCOLAX) 17 g packet Take 17 g by mouth daily.     . Prenatal Vit-Fe Fumarate-FA (MULTIVITAMIN-PRENATAL) 27-0.8 MG TABS tablet Take 1 tablet by mouth daily at 12 noon.   Past Month at Unknown time  . metroNIDAZOLE (FLAGYL) 500 MG tablet Take 1 tablet (500 mg total) by mouth 2 (two) times daily. 14 tablet 0     Review of Systems  Constitutional: Negative.   Eyes: Positive for photophobia. Negative for visual disturbance.  Gastrointestinal: Positive for abdominal pain, constipation, nausea and vomiting. Negative for diarrhea.  Genitourinary: Negative.   Neurological: Positive for headaches.   Physical Exam   Blood pressure (!) 134/91, pulse 89, temperature 98.1 F (36.7 C), temperature source Oral, resp. rate 16, height 5\' 9"  (  1.753 m), weight 76.5 kg, last menstrual period 09/09/2020, SpO2 99 %.  Patient Vitals for the past 24 hrs:  BP Temp Temp src Pulse Resp SpO2 Height Weight  11/30/20 1241 124/83 -- -- 80 15 100 % -- --  11/30/20 0956 (!) 134/91 -- -- 89 -- -- -- --  11/30/20 0931 (!) 141/89 98.1 F (36.7 C) Oral 95 16 99 % 5\' 9"  (1.753 m) 76.5 kg  11/30/20 0843 (!) 153/109 -- -- 93 16 100 % -- --  11/30/20 0842 (!) 153/109 98.6 F (37 C) Oral 95 16 100 % -- --    Physical Exam Vitals and nursing note reviewed. Exam conducted with a chaperone present.  Constitutional:      General: She is not in acute distress.    Appearance: She is well-developed and normal weight.  HENT:     Head: Normocephalic and atraumatic.  Cardiovascular:     Rate and Rhythm: Normal rate.  Pulmonary:     Effort: Pulmonary effort is normal. No respiratory distress.  Abdominal:     Palpations:  Abdomen is soft.     Tenderness: There is no abdominal tenderness.  Genitourinary:    Comments: Cervix closed/thick Skin:    General: Skin is warm and dry.  Neurological:     Mental Status: She is alert.  Psychiatric:        Mood and Affect: Mood normal.        Behavior: Behavior normal.     MAU Course  Procedures Results for orders placed or performed during the hospital encounter of 11/30/20 (from the past 24 hour(s))  Urinalysis, Routine w reflex microscopic Urine, Clean Catch     Status: Abnormal   Collection Time: 11/30/20  9:21 AM  Result Value Ref Range   Color, Urine YELLOW YELLOW   APPearance CLEAR CLEAR   Specific Gravity, Urine 1.014 1.005 - 1.030   pH 6.0 5.0 - 8.0   Glucose, UA 50 (A) NEGATIVE mg/dL   Hgb urine dipstick NEGATIVE NEGATIVE   Bilirubin Urine NEGATIVE NEGATIVE   Ketones, ur 5 (A) NEGATIVE mg/dL   Protein, ur NEGATIVE NEGATIVE mg/dL   Nitrite NEGATIVE NEGATIVE   Leukocytes,Ua NEGATIVE NEGATIVE   12/02/20 OB Transvaginal  Result Date: 11/30/2020 CLINICAL DATA:  Early pregnancy with cramping. EXAM: 12/02/2020 OB TRANSVAGINAL COMPARISON:  11/07/2020 FINDINGS: Number of IUPs:  2 Chorionicity/Amnionicity:  Dichorionic-diamniotic (thick membrane) TWIN 1 Yolk sac:  Yes Embryo:  Yes Cardiac Activity: Yes Heart Rate: 166 bpm MSD:   mm    w     d CRL:  18.2 mm   8 w 2 d                  11/09/2020 EDC: 07/10/2021 TWIN 2 Yolk sac:  Not visualized Embryo:  Not visualized MSD: 9.5 mm   5 w   5 d Subchorionic hemorrhage:  None visualized. Maternal uterus/adnexae: Subchorionic hemorrhage: None visualized Right ovary: Appears normal Left ovary: Not visualized Other :None Free fluid:  None IMPRESSION: 1. Again seen are 2 intrauterine gestational sacs with signs of dichorionic diamniotic twin gestation. 2. Twin 1 has a crown-rump length of 18.2 mm with an estimated gestational age of [redacted] weeks and 2 days. The fetal heart rate is 166 beats per minute. No complications identified. 3. The second  gestational sac has a mean sac diameter of 9.5 mm within estimated gestational age of [redacted] weeks and 5 days. No yolk sac or embryo identified. Absence of embryo  greater than 2 weeks after initial scan showing gestational sac without yolk sac meets definitive criteria for failed pregnancy. This follows SRU consensus guidelines: Diagnostic Criteria for Nonviable Pregnancy Early in the First Trimester. [facility] [phone]. Electronically Signed   By: Signa Kell M.D.   On: 11/30/2020 11:40    MDM Pt informed that the ultrasound is considered a limited OB ultrasound and is not intended to be a complete ultrasound exam.  Patient also informed that the ultrasound is not being completed with the intent of assessing for fetal or placental anomalies or any pelvic abnormalities.  Explained that the purpose of today's ultrasound is to assess for  viability.  Patient acknowledges the purpose of the exam and the limitations of the study.   Baby A - active IUP with FHR 176 bpm. Baby B - small gestational sac, appears to be empty & obviously smaller than other gestation.   Formal ultrasound ordered to evaluate second gestation - second sac remains empty with no growth consistent with failed pregnancy. Discussed results with patient who is upset. Chaplain called to bedside per request of significant other.   Per reviewed of records, dating back to 2012, will give patient diagnosis of chronic hypertension. She remains hypertensive in MAU but not in severe range and is currently un medicated. Will start on labetalol 200 mg BID.   Given option of oral tx vs IV headache cocktail. Patient opted for oral treatment. Given tylenol & flexeril with good relief in headache. Discussed rx & management of future headaches at home.    Assessment and Plan   1. Migraine without aura and without status migrainosus, not intractable  -Rx reglan & flexeril to take with tylenol for future headaches  2. Dichorionic diamniotic  twin pregnancy in first trimester   3. Abdominal cramping affecting pregnancy  -cervix closed. U/a negative.  -reviewed SAB precautions & reasons to return to MAU -discussed symptoms could be due to probably constipation & ways to treat constipation  4. Twin pregnancy with single intrauterine death, first trimester, fetus 2  -Second gestational sac did not develop. Fetus A looks good  5. Nausea and vomiting during pregnancy prior to [redacted] weeks gestation  -Rx reglan  6. Grief associated with loss of fetus  -seen by chaplain in MAU. Given list of grief resources -work note provdided  7. [redacted] weeks gestation of pregnancy   8. Chronic hypertension in obstetric context in first trimester  -rx labetalol 200 mg BID -reviewed importance of HTN management in pregnancy. Given list of ob providers & encouraged to start care asap     Judeth Horn 11/30/2020, 11:18 AM

## 2020-12-08 ENCOUNTER — Ambulatory Visit: Payer: Medicaid - Out of State

## 2020-12-08 LAB — OB RESULTS CONSOLE HIV ANTIBODY (ROUTINE TESTING): HIV: NONREACTIVE

## 2020-12-08 LAB — OB RESULTS CONSOLE HEPATITIS B SURFACE ANTIGEN: Hepatitis B Surface Ag: NEGATIVE

## 2020-12-08 LAB — OB RESULTS CONSOLE RUBELLA ANTIBODY, IGM: Rubella: IMMUNE

## 2020-12-20 ENCOUNTER — Other Ambulatory Visit: Payer: Self-pay

## 2020-12-20 ENCOUNTER — Inpatient Hospital Stay (HOSPITAL_COMMUNITY)
Admission: AD | Admit: 2020-12-20 | Discharge: 2020-12-20 | Disposition: A | Discharge status: Home / Self Care | Payer: Medicaid - Out of State | Attending: Obstetrics and Gynecology | Admitting: Obstetrics and Gynecology

## 2020-12-20 NOTE — MAU Note (Signed)
Notified Pt that  MAU providers could not fill out FMLA forms. She needed to go back to Dr. Bing Ree office to have them completed. Pt agreeable to that.
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
## 2021-03-14 ENCOUNTER — Encounter (HOSPITAL_COMMUNITY): Payer: Self-pay | Admitting: Obstetrics and Gynecology

## 2021-03-14 ENCOUNTER — Other Ambulatory Visit: Payer: Self-pay

## 2021-03-14 ENCOUNTER — Inpatient Hospital Stay (HOSPITAL_COMMUNITY)
Admission: AD | Admit: 2021-03-14 | Discharge: 2021-03-14 | Disposition: A | Discharge status: Home / Self Care | Payer: 59 | Attending: Obstetrics and Gynecology | Admitting: Obstetrics and Gynecology

## 2021-03-14 DIAGNOSIS — O4702 False labor before 37 completed weeks of gestation, second trimester: Secondary | ICD-10-CM | POA: Insufficient documentation

## 2021-03-14 DIAGNOSIS — O09522 Supervision of elderly multigravida, second trimester: Secondary | ICD-10-CM | POA: Insufficient documentation

## 2021-03-14 DIAGNOSIS — Z3A23 23 weeks gestation of pregnancy: Secondary | ICD-10-CM | POA: Diagnosis not present

## 2021-03-14 NOTE — MAU Note (Signed)
Tammy Mcgrath is a 40 y.o. at [redacted]w[redacted]d here in MAU reporting: abdominal cramping that started yesterday, states it is intermittent, was told to come here after her doctor office. States pain has slowed down a lot. Having some bleeding since being checked in the office. Denies LOF. +FM  Onset of complaint: today  Pain score: 3/10  Vitals:   03/14/21 1656  BP: 120/77  Pulse: 91  Resp: 16  Temp: 98.5 F (36.9 C)  SpO2: 98%     FHT: +FM, pt wearing a form fitted dress   Lab orders placed from triage: UA

## 2021-03-14 NOTE — MAU Provider Note (Signed)
History     CSN: [id number]  Arrival date and time: 03/14/21 1624   Event Date/Time   First Provider Initiated Contact with Patient 03/14/21 1723                                                    Chief Complaint  Patient presents with   Contractions   HPI Tammy Mcgrath is a 40 y.o. G3P1011 at [redacted]w[redacted]d who presents from the office for evaluation of abdominal pain.  Reports abdominal pain every 10 minutes today.  Denies nausea, vomiting, dysuria, vaginal bleeding, loss of fluid.  Reports positive fetal movement.  Since leaving the office she has had no further abdominal pain & reports feeling better.  Was seen in office by Dr. Richardson Dopp. Per Dr. Richardson Dopp, patient's cervix is dilated 1/thick/high.  She was given a dose of IM rocephin for treatment of UTI per her urine dip & has a urine culture pending.                                                                                                                                                                                                                                                                                                                                                                                                       OB History     Gravida  3   Para  1   Term  1   Preterm  AB  1   Living  1      SAB  1   IAB      Ectopic      Multiple      Live Births  1           Past Medical History:  Diagnosis Date   Chronic hypertension    Depression    not a formal dx, "is sad, not suicidal"   Eczema    Headache    Infection    UTI   Migraines     Past Surgical History:  Procedure Laterality Date   BREAST SURGERY     reduction   DILATION AND CURETTAGE OF UTERUS      Family History  Problem Relation Age of Onset   Hypertension Father    Stroke Father     Social History   Tobacco Use   Smoking status: Never   Smokeless tobacco: Never  Vaping Use   Vaping Use: Never used  Substance Use  Topics   Alcohol use: Not Currently   Drug use: Not Currently    Types: Marijuana    Comment: last was ~3 months ago    Allergies: No Known Allergies  Medications Prior to Admission  Medication Sig Dispense Refill Last Dose   cyclobenzaprine (FLEXERIL) 5 MG tablet Take 1 tablet (5 mg total) by mouth 3 (three) times daily as needed (headache). 20 tablet 0    labetalol (NORMODYNE) 200 MG tablet Take 1 tablet (200 mg total) by mouth 2 (two) times daily. 60 tablet 0    metoCLOPramide (REGLAN) 10 MG tablet Take 1 tablet (10 mg total) by mouth every 8 (eight) hours as needed for nausea (or take with tylenol for headache). 30 tablet 0    polyethylene glycol (MIRALAX / GLYCOLAX) 17 g packet Take 17 g by mouth daily.      Prenatal Vit-Fe Fumarate-FA (MULTIVITAMIN-PRENATAL) 27-0.8 MG TABS tablet Take 1 tablet by mouth daily at 12 noon.       Review of Systems  Constitutional: Negative.   Gastrointestinal:  Positive for abdominal pain (none currently). Negative for nausea and vomiting.  Genitourinary: Negative.   Physical Exam   Blood pressure 120/77, pulse 91, temperature 98.5 F (36.9 C), temperature source Oral, resp. rate 16, weight 84.8 kg, last menstrual period 09/09/2020, SpO2 98 %.  Physical Exam Vitals and nursing note reviewed. Exam conducted with a chaperone present.  Constitutional:      General: She is not in acute distress.    Appearance: Normal appearance. She is not ill-appearing.  HENT:     Head: Normocephalic and atraumatic.  Eyes:     General: No scleral icterus. Pulmonary:     Effort: Pulmonary effort is normal. No respiratory distress.  Abdominal:     General: There is distension (gravid uterus).     Palpations: Abdomen is soft. There is mass (fibroid palpated at uterine fundus).  Genitourinary:    Comments: Dilation: 1 Effacement (%): Thick Station: [place], -3 Exam by:: e Sameen Leas np  Skin:    General: Skin is warm and dry.  Neurological:     Mental  Status: She is alert.  Psychiatric:        Mood and Affect: Mood normal.        Behavior: Behavior normal.   NST:  Baseline: 145 bpm, Variability: Good {> 6 bpm), Accelerations: Non-reactive but appropriate for gestational age, Decelerations: Absent, and some uterine irritability  MAU Course  Procedures No results  found for this or any previous visit (from the past 24 hour(s)).  MDM Patient reports resolution of symptoms since leaving Dr. Dawayne Patricia office.  She had some uterine irritability on the monitor.  Cervix is unchanged from 3 hours ago in the office. She has follow up in the office tomorrow.  Reviewed with Dr. Macon Large - ok for discharge home.   Assessment and Plan   1. Preterm uterine contractions in second trimester, antepartum   2. [redacted] weeks gestation of pregnancy    -reviewed preterm labor precautions -keep appt in the office tomorrow -will defer further antibiotics until her urine culture returns since patient has no symptoms  Judeth Horn 03/14/2021, 5:23 PM

## 2021-03-16 LAB — OB RESULTS CONSOLE GC/CHLAMYDIA: Gonorrhea: NEGATIVE

## 2021-03-28 LAB — OB RESULTS CONSOLE RPR: RPR: NONREACTIVE

## 2021-05-04 ENCOUNTER — Encounter: Payer: 59 | Attending: Obstetrics and Gynecology | Admitting: Registered"

## 2021-05-04 ENCOUNTER — Encounter: Payer: Self-pay | Admitting: Registered"

## 2021-05-04 NOTE — Progress Notes (Signed)
Referral reason misunderstood, patient does not have GDM diagnosis and not appropriate for class. Dorothea Ogle, patient access specialist, called patient before class and LVM that we need to schedule 1:1 visit instead of class.

## 2021-05-09 ENCOUNTER — Telehealth: Payer: Self-pay | Admitting: Registered"

## 2021-05-09 NOTE — Telephone Encounter (Signed)
RD returned patient's call. Patient wanted to know if she should be concerned about her diet and wasn't sure how much she should reduce carb intake as her MD suggested. Patient was scheduled to attend GDM class and I explained why she was contacted before class as she did not have the GDM diagnosis. Discussed use of glucometer and provided the diabetes.org website for more information about what normal blood sugar is during pregnancy. Patient states she does not have immediate family history of diabetes. Patient was encouraged to contact her insurance company if she would like to have a 1:1 visit for general nutrition during pregnancy.

## 2021-05-12 ENCOUNTER — Ambulatory Visit (INDEPENDENT_AMBULATORY_CARE_PROVIDER_SITE_OTHER): Payer: 59 | Admitting: Family Medicine

## 2021-05-12 ENCOUNTER — Other Ambulatory Visit: Payer: Self-pay

## 2021-05-12 VITALS — BP 112/84 | HR 113 | Wt 193.0 lb

## 2021-05-12 DIAGNOSIS — O10919 Unspecified pre-existing hypertension complicating pregnancy, unspecified trimester: Secondary | ICD-10-CM

## 2021-05-12 DIAGNOSIS — Z3A32 32 weeks gestation of pregnancy: Secondary | ICD-10-CM

## 2021-05-12 DIAGNOSIS — O09529 Supervision of elderly multigravida, unspecified trimester: Secondary | ICD-10-CM | POA: Insufficient documentation

## 2021-05-12 DIAGNOSIS — O0990 Supervision of high risk pregnancy, unspecified, unspecified trimester: Secondary | ICD-10-CM | POA: Insufficient documentation

## 2021-05-12 DIAGNOSIS — O0993 Supervision of high risk pregnancy, unspecified, third trimester: Secondary | ICD-10-CM

## 2021-05-12 DIAGNOSIS — O10913 Unspecified pre-existing hypertension complicating pregnancy, third trimester: Secondary | ICD-10-CM

## 2021-05-12 DIAGNOSIS — F418 Other specified anxiety disorders: Secondary | ICD-10-CM

## 2021-05-12 DIAGNOSIS — O09523 Supervision of elderly multigravida, third trimester: Secondary | ICD-10-CM

## 2021-05-12 DIAGNOSIS — Z3490 Encounter for supervision of normal pregnancy, unspecified, unspecified trimester: Secondary | ICD-10-CM | POA: Insufficient documentation

## 2021-05-13 DIAGNOSIS — O10919 Unspecified pre-existing hypertension complicating pregnancy, unspecified trimester: Secondary | ICD-10-CM | POA: Insufficient documentation

## 2021-05-13 NOTE — Progress Notes (Signed)
Subjective:  Tammy Mcgrath is a G3P1011 [redacted]w[redacted]d being seen today for her first obstetrical visit.  She has been receiving prenatal care at [facility].  Her last visit there was in August.  She left our office because she felt like she was not being listened to by her [facility] physician.  Her pregnancy is complicated by chronic hypertension and depression.  She was not told to start a baby aspirin by her prior physician. This is her first pregnancy.  In addition, early ultrasounds in this pregnancy show that there were 2 gestational sacs, 1 of which remained empty, while the other gestational sac formed at this pregnancy.  Patient has a high stress job and is having difficulty at that job due to the stress.  She sees a Veterinary surgeon.  She is not currently interested in antidepressants.  She does desire a leave of absence from work. Patient does intend to breast feed. Pregnancy history fully reviewed.   BP 112/84   Pulse (!) 113   Wt 193 lb (87.5 kg)   LMP 09/09/2020   BMI 28.50 kg/m   HISTORY: OB History  Gravida Para Term Preterm AB Living  3 1 1   1 1   SAB IAB Ectopic Multiple Live Births  1       1    # Outcome Date GA Lbr Len/2nd Weight Sex Delivery Anes PTL Lv  3 Current           2 Term 09/10/09    F Vag-Spont   LIV  1 SAB             Past Medical History:  Diagnosis Date   Chronic hypertension    Depression    not a formal dx, "is sad, not suicidal"   Eczema    Headache    Infection    UTI   Migraines     Past Surgical History:  Procedure Laterality Date   BREAST SURGERY     reduction   DILATION AND CURETTAGE OF UTERUS      Family History  Problem Relation Age of Onset   Hypertension Father    Stroke Father      Exam  BP 112/84   Pulse (!) 113   Wt 193 lb (87.5 kg)   LMP 09/09/2020   BMI 28.50 kg/m   Chaperone present during exam  CONSTITUTIONAL: Well-developed, well-nourished female in no acute distress.  HENT:  Normocephalic, atraumatic, External right  and left ear normal. Oropharynx is clear and moist EYES: Conjunctivae and EOM are normal. Pupils are equal, round, and reactive to light. No scleral icterus.  NECK: Normal range of motion, supple, no masses.  Normal thyroid.  CARDIOVASCULAR: Normal heart rate noted, regular rhythm RESPIRATORY: Clear to auscultation bilaterally. Effort and breath sounds normal, no problems with respiration noted. BREASTS: deferred ABDOMEN: Soft, normal bowel sounds, no distention noted.  No tenderness, rebound or guarding.  PELVIC: deferred MUSCULOSKELETAL: Normal range of motion. No tenderness.  No cyanosis, clubbing, or edema.  2+ distal pulses. SKIN: Skin is warm and dry. No rash noted. Not diaphoretic. No erythema. No pallor. NEUROLOGIC: Alert and oriented to person, place, and time. Normal reflexes, muscle tone coordination. No cranial nerve deficit noted. PSYCHIATRIC: Normal mood and affect. Normal behavior. Normal judgment and thought content.    Assessment:    Pregnancy: G3P1011 Patient Active Problem List   Diagnosis Date Noted   Chronic hypertension in pregnancy 05/13/2021   AMA (advanced maternal age) multigravida 35+ 05/12/2021  Supervision of high risk pregnancy, antepartum 05/12/2021      Plan:   1. Supervision of high risk pregnancy, antepartum FHT and FH normal  2. Antepartum multigravida of advanced maternal age  66. Chronic hypertension in pregnancy Will need serial ultrasounds.  Blood pressure currently controlled on labetalol 200 mg twice a day.  At this point the pregnancy, little utility in starting baby aspirin. Will need to start antenatal testing.  4. Depression with anxiety Discussed options with patient. Will get information from counselor. May need leave of absence from work.  Discussed that antidepressants in pregnancy are safe and that studies show improved psychosocial development of infants when the parents depression and anxiety are controlled.  Patient declining  medication at this point.  Reassured patient and encourage patient to continue with counseling.    Initial labs obtained Continue prenatal vitamins Reviewed n/v relief measures and warning s/s to report Reviewed recommended weight gain based on pre-gravid BMI Encouraged well-balanced diet CCNC completed> form faxed if has or is planning to apply for medicaid The nature of [facility] [employer] with multiple MDs and other Advanced Practice Providers was explained to patient; also emphasized that fellows, residents, and students are part of our team.  Problem list reviewed and updated. 70% of 30 min visit spent on counseling and coordination of care.     Levie Heritage 05/13/2021

## 2021-05-19 ENCOUNTER — Encounter: Payer: Self-pay | Admitting: General Practice

## 2021-05-20 ENCOUNTER — Encounter: Payer: Self-pay | Admitting: General Practice

## 2021-05-23 ENCOUNTER — Ambulatory Visit: Payer: 59 | Attending: Family Medicine

## 2021-05-23 ENCOUNTER — Other Ambulatory Visit: Payer: Self-pay

## 2021-05-23 ENCOUNTER — Ambulatory Visit: Payer: 59 | Admitting: *Deleted

## 2021-05-23 ENCOUNTER — Other Ambulatory Visit: Payer: Self-pay | Admitting: *Deleted

## 2021-05-23 ENCOUNTER — Encounter: Payer: Self-pay | Admitting: *Deleted

## 2021-05-23 VITALS — BP 120/88 | HR 105

## 2021-05-23 DIAGNOSIS — O0990 Supervision of high risk pregnancy, unspecified, unspecified trimester: Secondary | ICD-10-CM | POA: Insufficient documentation

## 2021-05-23 DIAGNOSIS — O10913 Unspecified pre-existing hypertension complicating pregnancy, third trimester: Secondary | ICD-10-CM

## 2021-05-23 DIAGNOSIS — O09529 Supervision of elderly multigravida, unspecified trimester: Secondary | ICD-10-CM

## 2021-05-23 DIAGNOSIS — O09523 Supervision of elderly multigravida, third trimester: Secondary | ICD-10-CM

## 2021-05-23 DIAGNOSIS — O10919 Unspecified pre-existing hypertension complicating pregnancy, unspecified trimester: Secondary | ICD-10-CM | POA: Diagnosis present

## 2021-05-26 ENCOUNTER — Other Ambulatory Visit: Payer: Self-pay

## 2021-05-26 ENCOUNTER — Ambulatory Visit (INDEPENDENT_AMBULATORY_CARE_PROVIDER_SITE_OTHER): Payer: 59 | Admitting: Family Medicine

## 2021-05-26 VITALS — BP 119/89 | HR 104 | Wt 199.0 lb

## 2021-05-26 DIAGNOSIS — O09523 Supervision of elderly multigravida, third trimester: Secondary | ICD-10-CM

## 2021-05-26 DIAGNOSIS — O10919 Unspecified pre-existing hypertension complicating pregnancy, unspecified trimester: Secondary | ICD-10-CM

## 2021-05-26 DIAGNOSIS — Z3A34 34 weeks gestation of pregnancy: Secondary | ICD-10-CM

## 2021-05-26 DIAGNOSIS — O0990 Supervision of high risk pregnancy, unspecified, unspecified trimester: Secondary | ICD-10-CM

## 2021-05-26 NOTE — Patient Instructions (Signed)

## 2021-05-26 NOTE — Progress Notes (Signed)
   PRENATAL VISIT NOTE  Subjective:  Tammy Mcgrath is a 40 y.o. G3P1011 at [redacted]w[redacted]d being seen today for ongoing prenatal care.  She is currently monitored for the following issues for this high-risk pregnancy and has AMA (advanced maternal age) multigravida 35+; Supervision of high risk pregnancy, antepartum; and Chronic hypertension in pregnancy on their problem list.  Patient reports no complaints.  Contractions: Not present. Vag. Bleeding: None.  Movement: Present. Denies leaking of fluid.   The following portions of the patient's history were reviewed and updated as appropriate: allergies, current medications, past family history, past medical history, past social history, past surgical history and problem list.   Objective:   Vitals:   05/26/21 1111  BP: 119/89  Pulse: (!) 104  Weight: 199 lb (90.3 kg)    Fetal Status: Fetal Heart Rate (bpm): 134   Movement: Present     General:  Alert, oriented and cooperative. Patient is in no acute distress.  Skin: Skin is warm and dry. No rash noted.   Cardiovascular: Normal heart rate noted  Respiratory: Normal respiratory effort, no problems with respiration noted  Abdomen: Soft, gravid, appropriate for gestational age.  Pain/Pressure: Present     Pelvic: Cervical exam deferred        Extremities: Normal range of motion.  Edema: None  Mental Status: Normal mood and affect. Normal behavior. Normal judgment and thought content.   Assessment and Plan:  Pregnancy: G3P1011 at [redacted]w[redacted]d 1. [redacted] weeks gestation of pregnancy   2. Supervision of high risk pregnancy, antepartum Continue prenatal care. Cultures next visit  3. Multigravida of advanced maternal age in third trimester   4. Chronic hypertension in pregnancy On Nifedipine BP is well controlled Most recent growth ok MFM for antenatal testing  Preterm labor symptoms and general obstetric precautions including but not limited to vaginal bleeding, contractions, leaking of fluid and  fetal movement were reviewed in detail with the patient. Please refer to After Visit Summary for other counseling recommendations.   Return in 4 weeks (on 06/23/2021).  Future Appointments  Date Time Provider Department Center  06/02/2021  8:15 AM [facility] NST [facility]  06/09/2021  2:15 PM [facility] NST [facility]  06/10/2021  9:15 AM Levie Heritage, DO [facility] None  06/16/2021  8:35 AM Levie Heritage, DO [facility] None  06/16/2021  1:15 PM [facility] NST [facility]  06/21/2021  9:00 AM [facility] NURSE [facility]  06/21/2021  9:15 AM [facility] US2 [facility]  06/22/2021  8:35 AM Adrian Blackwater, Rhona Raider, DO [facility] None  06/29/2021  8:35 AM Adrian Blackwater, Rhona Raider, DO [facility] None    Reva Bores, MD

## 2021-06-02 ENCOUNTER — Other Ambulatory Visit: Payer: Self-pay

## 2021-06-02 ENCOUNTER — Ambulatory Visit (INDEPENDENT_AMBULATORY_CARE_PROVIDER_SITE_OTHER): Payer: 59

## 2021-06-02 ENCOUNTER — Ambulatory Visit: Payer: 59 | Admitting: *Deleted

## 2021-06-02 VITALS — BP 136/97 | HR 90 | Wt 195.0 lb

## 2021-06-02 DIAGNOSIS — O10919 Unspecified pre-existing hypertension complicating pregnancy, unspecified trimester: Secondary | ICD-10-CM

## 2021-06-02 NOTE — Progress Notes (Signed)
Pt informed that the ultrasound is considered a limited OB ultrasound and is not intended to be a complete ultrasound exam.  Patient also informed that the ultrasound is not being completed with the intent of assessing for fetal or placental anomalies or any pelvic abnormalities.  Explained that the purpose of today's ultrasound is to assess for  Presentation, BPP and amniotic fluid volume.  Patient acknowledges the purpose of the exam and the limitations of the study.    Pt reports slight H/A today however states she has not yet taken Nifedipine today and has not eaten. Pt advised to go to MAU if H/A does not resolve after taking medication and eating today.

## 2021-06-09 ENCOUNTER — Other Ambulatory Visit: Payer: 59

## 2021-06-10 ENCOUNTER — Telehealth (INDEPENDENT_AMBULATORY_CARE_PROVIDER_SITE_OTHER): Payer: 59 | Admitting: Family Medicine

## 2021-06-10 ENCOUNTER — Other Ambulatory Visit: Payer: Self-pay

## 2021-06-10 VITALS — BP 136/94 | HR 101

## 2021-06-10 DIAGNOSIS — O0990 Supervision of high risk pregnancy, unspecified, unspecified trimester: Secondary | ICD-10-CM

## 2021-06-10 DIAGNOSIS — O10919 Unspecified pre-existing hypertension complicating pregnancy, unspecified trimester: Secondary | ICD-10-CM

## 2021-06-10 DIAGNOSIS — O10913 Unspecified pre-existing hypertension complicating pregnancy, third trimester: Secondary | ICD-10-CM

## 2021-06-10 DIAGNOSIS — Z3A36 36 weeks gestation of pregnancy: Secondary | ICD-10-CM

## 2021-06-10 DIAGNOSIS — O09523 Supervision of elderly multigravida, third trimester: Secondary | ICD-10-CM

## 2021-06-10 NOTE — Progress Notes (Signed)
OBSTETRICS PRENATAL VIRTUAL VISIT ENCOUNTER NOTE  Provider location: Center for [facility]   Patient location: Home  I connected with Tammy Mcgrath on 06/10/21 at  9:15 AM EDT by MyChart Video Encounter and verified that I am speaking with the correct person using two identifiers. I discussed the limitations, risks, security and privacy concerns of performing an evaluation and management service virtually and the availability of in person appointments. I also discussed with the patient that there may be a patient responsible charge related to this service. The patient expressed understanding and agreed to proceed. Subjective:  Tammy Mcgrath is a 40 y.o. G3P1011 at [redacted]w[redacted]d being seen today for ongoing prenatal care.  She is currently monitored for the following issues for this high-risk pregnancy and has AMA (advanced maternal age) multigravida 35+; Supervision of high risk pregnancy, antepartum; and Chronic hypertension in pregnancy on their problem list.  Patient reports headache - mild.  Contractions: Irritability. Vag. Bleeding: None.  Movement: Present. Denies any leaking of fluid.   The following portions of the patient's history were reviewed and updated as appropriate: allergies, current medications, past family history, past medical history, past social history, past surgical history and problem list.   Objective:   Vitals:   06/10/21 0911  BP: (!) 136/94  Pulse: (!) 101    Fetal Status:     Movement: Present     General:  Alert, oriented and cooperative. Patient is in no acute distress.  Respiratory: Normal respiratory effort, no problems with respiration noted  Mental Status: Normal mood and affect. Normal behavior. Normal judgment and thought content.  Rest of physical exam deferred due to type of encounter  Imaging: [place] MFM FETAL BPP WO NON STRESS  Result Date:  05/23/2021 ----------------------------------------------------------------------  OBSTETRICS REPORT                       (Signed Final 05/23/2021 10:26 am) ---------------------------------------------------------------------- Patient Info  ID #:       [id number]                          D.O.B.:  24-Feb-1981 (39 yrs)  Name:       Tammy Mcgrath                Visit Date: 05/23/2021 08:33 am ---------------------------------------------------------------------- Performed By  Attending:        Ma Rings MD         Ref. Address:     [address]                                                             [address]                                                             [place]  [id number]  Performed By:     Reinaldo Raddle            [facility]                                     [facility]  Referred By:      Levie Heritage                    MD ---------------------------------------------------------------------- Orders  #  Description                           Code        Ordered By  1  [place] MFM FETAL BPP WO NON               76819.01    Abbott Jasinski     STRESS  2  [place] MFM OB DETAIL +14 WK               76811.01    Candelaria Celeste ----------------------------------------------------------------------  #  Order #                     Accession #                Episode #  1  [id number]                   [id number]                 [id number]  2  [id number]                   [id number]                 [id number] ---------------------------------------------------------------------- Indications  Hypertension - Chronic/Pre-existing            O10.019  (Labetalol)  Advanced maternal age multigravida 49+,        O5.523  third trimester (37)  [redacted] weeks gestation of pregnancy                 Z3A.33 ---------------------------------------------------------------------- Fetal Evaluation  Num Of Fetuses:         1  Fetal Heart Rate(bpm):  131  Cardiac Activity:       Observed  Presentation:           Cephalic  Placenta:               Anterior  P. Cord Insertion:      Visualized, central  Amniotic Fluid  AFI FV:      Within normal limits  AFI Sum(cm)     %Tile       Largest Pocket(cm)  9.44            15          2.47  RUQ(cm)       RLQ(cm)       LUQ(cm)        LLQ(cm)  2.47          2.22          2.43           2.32 ---------------------------------------------------------------------- Biophysical Evaluation  Amniotic F.V:   Within normal limits       F. Tone:        Observed  F. Movement:    Observed                   Score:          8/8  F. Breathing:   Observed ---------------------------------------------------------------------- Biometry  BPD:      85.8  mm     G. Age:  34w 4d         69  %    CI:        71.54   %    70 - 86                                                          FL/HC:      18.9   %    19.4 - 21.8  HC:       323   mm     G. Age:  36w 4d         80  %    HC/AC:      1.09        0.96 - 1.11  AC:      295.4  mm     G. Age:  33w 4d         43  %    FL/BPD:     71.2   %    71 - 87  FL:       61.1  mm     G. Age:  31w 5d        3.6  %    FL/AC:      20.7   %    20 - 24  HUM:        53  mm     G. Age:  30w 6d        < 5  %  CER:        47  mm     G. Age:  35w 5d         62  %  LV:        2.5  mm  CM:        5.8  mm  Est. FW:    2187  gm    4 lb 13 oz      30  % ---------------------------------------------------------------------- OB History  Gravidity:    3  Living:       1 ---------------------------------------------------------------------- Gestational Age  LMP:  36w 4d        Date:  09/09/20                 EDD:   06/16/21  U/S Today:     34w 1d                                        EDD:   07/03/21  Best:          33w 6d     Det. By:  Previous Ultrasound      EDD:    07/05/21                                      (12/08/20) ---------------------------------------------------------------------- Anatomy  Cranium:               Appears normal         Aortic Arch:            Appears normal  Cavum:                 Appears normal         Ductal Arch:            Appears normal  Ventricles:            Appears normal         Diaphragm:              Appears normal  Choroid Plexus:        Appears normal         Stomach:                Appears normal, left                                                                        sided  Cerebellum:            Appears normal         Abdomen:                Appears normal  Posterior Fossa:       Appears normal         Abdominal Wall:         Not well visualized  Nuchal Fold:           Not applicable (>20    Cord Vessels:           Appears normal ([redacted]                         wks GA)                                        vessel cord)  Face:                  Orbits nl; profile not Kidneys:  Appear normal                         well visualized  Lips:                  Not well visualized    Bladder:                Appears normal  Thoracic:              Appears normal         Spine:                  Not well visualized  Heart:                 Appears normal         Upper Extremities:      LUE Vis, RUE NWV                         (4CH, axis, and                         situs)  RVOT:                  Appears normal         Lower Extremities:      Visualized  LVOT:                  Appears normal ---------------------------------------------------------------------- Cervix Uterus Adnexa  Cervix  Not visualized (advanced GA >24wks)  Uterus  No abnormality visualized.  Right Ovary  Within normal limits.  Left Ovary  Within normal limits.  Cul De Sac  No free fluid seen.  Adnexa  No adnexal mass visualized. ---------------------------------------------------------------------- Comments  This patient was seen for an ultrasound exam today due to   advanced maternal age and chronic hypertension currently  treated with nifedipine.  The patient recently transferred her  care from another OB group.  She denies any other problems  in her current pregnancy.  She has declined all screening tests for fetal aneuploidy in  her current pregnancy.  She was informed that the fetal growth and amniotic fluid  level appears appropriate for her gestational age.  The views of the fetal anatomy were limited today due to her  advanced gestational age.  A biophysical profile performed today due to chronic  hypertension was 8 out of 8.  Due to chronic hypertension treated with nifedipine, we will  continue to follow her with weekly fetal testing until delivery.  Another BPP was scheduled in 1 week. ----------------------------------------------------------------------                   Ma Rings, MD Electronically Signed Final Report   05/23/2021 10:26 am ----------------------------------------------------------------------  [place] MFM OB DETAIL +14 WK  Result Date: 05/23/2021 ----------------------------------------------------------------------  OBSTETRICS REPORT                       (Signed Final 05/23/2021 10:26 am) ---------------------------------------------------------------------- Patient Info  ID #:       [id number]                          D.O.B.:  Jul 19, 1981 (39 yrs)  Name:       [facility]  Visit Date: 05/23/2021 08:33 am ---------------------------------------------------------------------- Performed By  Attending:        Ma Rings MD         Ref. Address:     [address]                                                             [place]                                                             [address]  Performed By:     Reinaldo Raddle            [facility]                                     [facility]                                                              [facility]  Referred By:      Levie Heritage                    MD ---------------------------------------------------------------------- Orders  #  Description                           Code        Ordered By  1  [place] MFM FETAL BPP WO NON               76819.01    Jowel Waltner     STRESS  2  [place] MFM OB DETAIL +14 WK               71062.69    Candelaria Celeste ----------------------------------------------------------------------  #  Order #                     Accession #  Episode #  1  [id number]                   [id number]                 [id number]  2  [id number]                   [id number]                 [id number] ---------------------------------------------------------------------- Indications  Hypertension - Chronic/Pre-existing            O10.019  (Labetalol)  Advanced maternal age multigravida 36+,        O74.523  third trimester (77)  [redacted] weeks gestation of pregnancy                Z3A.33 ---------------------------------------------------------------------- Fetal Evaluation  Num Of Fetuses:         1  Fetal Heart Rate(bpm):  131  Cardiac Activity:       Observed  Presentation:           Cephalic  Placenta:               Anterior  P. Cord Insertion:      Visualized, central  Amniotic Fluid  AFI FV:      Within normal limits  AFI Sum(cm)     %Tile       Largest Pocket(cm)  9.44            15          2.47  RUQ(cm)       RLQ(cm)       LUQ(cm)        LLQ(cm)  2.47          2.22          2.43           2.32 ---------------------------------------------------------------------- Biophysical Evaluation  Amniotic F.V:   Within normal limits       F. Tone:        Observed  F. Movement:    Observed                   Score:          8/8  F. Breathing:   Observed ---------------------------------------------------------------------- Biometry  BPD:      85.8  mm     G. Age:  34w 4d         69  %     CI:        71.54   %    70 - 86                                                          FL/HC:      18.9   %    19.4 - 21.8  HC:       323   mm     G. Age:  36w 4d         80  %    HC/AC:      1.09        0.96 - 1.11  AC:      295.4  mm     G. Age:  33w 4d  43  %    FL/BPD:     71.2   %    71 - 87  FL:       61.1  mm     G. Age:  31w 5d        3.6  %    FL/AC:      20.7   %    20 - 24  HUM:        53  mm     G. Age:  30w 6d        < 5  %  CER:        47  mm     G. Age:  35w 5d         62  %  LV:        2.5  mm  CM:        5.8  mm  Est. FW:    2187  gm    4 lb 13 oz      30  % ---------------------------------------------------------------------- OB History  Gravidity:    3  Living:       1 ---------------------------------------------------------------------- Gestational Age  LMP:           36w 4d        Date:  09/09/20                 EDD:   06/16/21  U/S Today:     34w 1d                                        EDD:   07/03/21  Best:          33w 6d     Det. By:  Previous Ultrasound      EDD:   07/05/21                                      (12/08/20) ---------------------------------------------------------------------- Anatomy  Cranium:               Appears normal         Aortic Arch:            Appears normal  Cavum:                 Appears normal         Ductal Arch:            Appears normal  Ventricles:            Appears normal         Diaphragm:              Appears normal  Choroid Plexus:        Appears normal         Stomach:                Appears normal, left  sided  Cerebellum:            Appears normal         Abdomen:                Appears normal  Posterior Fossa:       Appears normal         Abdominal Wall:         Not well visualized  Nuchal Fold:           Not applicable (>20    Cord Vessels:           Appears normal ([redacted]                         wks GA)                                        vessel cord)  Face:                   Orbits nl; profile not Kidneys:                Appear normal                         well visualized  Lips:                  Not well visualized    Bladder:                Appears normal  Thoracic:              Appears normal         Spine:                  Not well visualized  Heart:                 Appears normal         Upper Extremities:      LUE Vis, RUE NWV                         (4CH, axis, and                         situs)  RVOT:                  Appears normal         Lower Extremities:      Visualized  LVOT:                  Appears normal ---------------------------------------------------------------------- Cervix Uterus Adnexa  Cervix  Not visualized (advanced GA >24wks)  Uterus  No abnormality visualized.  Right Ovary  Within normal limits.  Left Ovary  Within normal limits.  Cul De Sac  No free fluid seen.  Adnexa  No adnexal mass visualized. ---------------------------------------------------------------------- Comments  This patient was seen for an ultrasound exam today due to  advanced maternal age and chronic hypertension currently  treated with nifedipine.  The patient recently transferred her  care from another OB group.  She denies any other problems  in her current pregnancy.  She has declined all screening tests for fetal aneuploidy in  her current pregnancy.  She was informed that the fetal  growth and amniotic fluid  level appears appropriate for her gestational age.  The views of the fetal anatomy were limited today due to her  advanced gestational age.  A biophysical profile performed today due to chronic  hypertension was 8 out of 8.  Due to chronic hypertension treated with nifedipine, we will  continue to follow her with weekly fetal testing until delivery.  Another BPP was scheduled in 1 week. ----------------------------------------------------------------------                   Ma Rings, MD Electronically Signed Final Report   05/23/2021 10:26 am  ----------------------------------------------------------------------  US FETAL BPP W/NONSTRESS  Result Date: 06/03/2021 ----------------------------------------------------------------------  OBSTETRICS REPORT                       (Signed Final 06/03/2021 08:45 am) ---------------------------------------------------------------------- Patient Info  ID #:       [id number]                          D.O.B.:  [birth year] (40 yrs)  Name:       Tammy Mcgrath                Visit Date: 06/02/2021 10:45 am ---------------------------------------------------------------------- Performed By  Attending:        Merian Capron MD     Ref. Address:     [address]                                                             [place]                                                             [id number]  Performed By:     Sedalia Muta Day RNC          Location:         [facility] for                                                             [facility]                                                             [facility]care at  [facility]  Referred By:      Levie Heritage                    MD ---------------------------------------------------------------------- Orders  #  Description                           Code        Ordered By  1  US FETAL BPP W/NONSTRESS              49826.4     Merian Capron ----------------------------------------------------------------------  #  Order #                     Accession #                Episode #  1  [id number]                   [id number]                 [id number] ---------------------------------------------------------------------- Service(s) Provided  US Fetal BPP W NST                                    [phone] ---------------------------------------------------------------------- Indications  [redacted]  weeks gestation of pregnancy                Z3A.35  Hypertension - Chronic/Pre-existing            O10.019 ---------------------------------------------------------------------- Fetal Evaluation  Num Of Fetuses:         1  Preg. Location:         Intrauterine  Cardiac Activity:       Observed  Presentation:           Cephalic  Amniotic Fluid  AFI FV:      Within normal limits  AFI Sum(cm)     %Tile       Largest Pocket(cm)  12.2            37          4.6  RUQ(cm)       RLQ(cm)       LUQ(cm)        LLQ(cm)  2             2.7           4.6            2.9 ---------------------------------------------------------------------- Biophysical Evaluation  Amniotic F.V:   Pocket => 2 cm             F. Tone:        Observed  F. Movement:    Observed                   N.S.T:          Reactive  F. Breathing:   Observed                   Score:  10/10 ---------------------------------------------------------------------- OB History  Gravidity:    3  Living:       1 ---------------------------------------------------------------------- Gestational Age  LMP:           38w 0d        Date:  09/09/20                 EDD:   06/16/21  Best:          Consuello Closs 2d     Det. By:  Previous Ultrasound      EDD:   07/05/21                                      (12/08/20) ---------------------------------------------------------------------- Impression  Antenatal testing due to chronic hypertension on medications.  Testing is reassuring, BPP 10/10. ---------------------------------------------------------------------- Recommendations  Continue weekly antenatal testing till delivery . ----------------------------------------------------------------------                Merian Capron, MD Electronically Signed Final Report   06/03/2021 08:45 am ----------------------------------------------------------------------   Assessment and Plan:  Pregnancy: G3P1011 at [redacted]w[redacted]d 1. Multigravida of advanced maternal age in third trimester Good fetal  movement  2. Supervision of high risk pregnancy, antepartum  3. Chronic hypertension in pregnancy BP slightly elevated. Will increase nifedipine. Severe sxs reviewed.  Preterm labor symptoms and general obstetric precautions including but not limited to vaginal bleeding, contractions, leaking of fluid and fetal movement were reviewed in detail with the patient. I discussed the assessment and treatment plan with the patient. The patient was provided an opportunity to ask questions and all were answered. The patient agreed with the plan and demonstrated an understanding of the instructions. The patient was advised to call back or seek an in-person office evaluation/go to [facility] at [facility] for any urgent or concerning symptoms. Please refer to After Visit Summary for other counseling recommendations.   I provided 7 minutes of face-to-face time during this encounter.  No follow-ups on file.  Future Appointments  Date Time Provider Department Center  06/16/2021  8:35 AM Levie Heritage, DO [facility] None  06/16/2021  1:15 PM [facility] NST [facility]  06/21/2021  9:00 AM [facility] NURSE [facility]  06/21/2021  9:15 AM [facility] US2 [facility] [place]  06/22/2021  8:35 AM Levie Heritage, DO [facility] None  06/29/2021  8:35 AM Adrian Blackwater, Rhona Raider, DO [facility] None    Levie Heritage, DO Center for [employer], [facility]

## 2021-06-16 ENCOUNTER — Other Ambulatory Visit: Payer: Self-pay

## 2021-06-16 ENCOUNTER — Other Ambulatory Visit (HOSPITAL_COMMUNITY)
Admission: RE | Admit: 2021-06-16 | Discharge: 2021-06-16 | Disposition: A | Discharge status: Home / Self Care | Payer: 59 | Source: Ambulatory Visit | Attending: Family Medicine | Admitting: Family Medicine

## 2021-06-16 ENCOUNTER — Ambulatory Visit: Payer: 59

## 2021-06-16 ENCOUNTER — Ambulatory Visit: Payer: 59 | Admitting: *Deleted

## 2021-06-16 ENCOUNTER — Ambulatory Visit (INDEPENDENT_AMBULATORY_CARE_PROVIDER_SITE_OTHER): Payer: 59 | Admitting: Family Medicine

## 2021-06-16 VITALS — BP 122/90 | HR 111

## 2021-06-16 VITALS — BP 137/91 | HR 98 | Wt 196.0 lb

## 2021-06-16 DIAGNOSIS — Z3A37 37 weeks gestation of pregnancy: Secondary | ICD-10-CM | POA: Diagnosis not present

## 2021-06-16 DIAGNOSIS — O10919 Unspecified pre-existing hypertension complicating pregnancy, unspecified trimester: Secondary | ICD-10-CM

## 2021-06-16 DIAGNOSIS — O09523 Supervision of elderly multigravida, third trimester: Secondary | ICD-10-CM | POA: Insufficient documentation

## 2021-06-16 DIAGNOSIS — O0990 Supervision of high risk pregnancy, unspecified, unspecified trimester: Secondary | ICD-10-CM

## 2021-06-16 NOTE — Progress Notes (Signed)

## 2021-06-16 NOTE — Progress Notes (Signed)
   PRENATAL VISIT NOTE  Subjective:  Tammy Mcgrath is a 40 y.o. G3P1011 at [redacted]w[redacted]d being seen today for ongoing prenatal care.  She is currently monitored for the following issues for this high-risk pregnancy and has AMA (advanced maternal age) multigravida 35+; Supervision of high risk pregnancy, antepartum; and Chronic hypertension in pregnancy on their problem list.  Patient reports  uncomfortable .  Contractions: Irritability. Vag. Bleeding: None.  Movement: Present. Denies leaking of fluid.   The following portions of the patient's history were reviewed and updated as appropriate: allergies, current medications, past family history, past medical history, past social history, past surgical history and problem list.   Objective:   Vitals:   06/16/21 0823  BP: (!) 137/91  Pulse: 98  Weight: 196 lb (88.9 kg)    Fetal Status: Fetal Heart Rate (bpm): 143   Movement: Present     General:  Alert, oriented and cooperative. Patient is in no acute distress.  Skin: Skin is warm and dry. No rash noted.   Cardiovascular: Normal heart rate noted  Respiratory: Normal respiratory effort, no problems with respiration noted  Abdomen: Soft, gravid, appropriate for gestational age.  Pain/Pressure: Present     Pelvic: Cervical exam deferred        Extremities: Normal range of motion.  Edema: Trace  Mental Status: Normal mood and affect. Normal behavior. Normal judgment and thought content.   Assessment and Plan:  Pregnancy: G3P1011 at [redacted]w[redacted]d 1. Multigravida of advanced maternal age in third trimester - Culture, beta strep (group b only) - GC/Chlamydia probe amp ([place])not at [facility]  2. Supervision of high risk pregnancy, antepartum FHT and FH normal - Culture, beta strep (group b only) - GC/Chlamydia probe amp ([place])not at [facility]  3. [redacted] weeks gestation of pregnancy - Culture, beta strep (group b only) - GC/Chlamydia probe amp ([place])not at [facility]  4. Chronic hypertension in  pregnancy On nifedipine 60mg  daily On ASA 81mg  Induce at 39 weeks BPP today  Term labor symptoms and general obstetric precautions including but not limited to vaginal bleeding, contractions, leaking of fluid and fetal movement were reviewed in detail with the patient. Please refer to After Visit Summary for other counseling recommendations.   No follow-ups on file.  Future Appointments  Date Time Provider Department Center  06/21/2021  9:00 AM [facility] NURSE [facility]  06/21/2021  9:15 AM [facility] US2 [facility]  06/22/2021  8:35 AM [facility] DO [facility] None  06/28/2021  6:30 AM [facility]-LD SCHED ROOM [facility] None  07/28/2021 11:15 AM 06/30/2021, DO [facility] None    07/30/2021, DO

## 2021-06-16 NOTE — Progress Notes (Signed)
Patient states she is taking nifedipine 60 mg daily Patient is also complaining of constipation. Armandina Stammer  RN

## 2021-06-17 LAB — GC/CHLAMYDIA PROBE AMP (~~LOC~~) NOT AT ARMC
Chlamydia: NEGATIVE
Comment: NEGATIVE
Comment: NORMAL
Neisseria Gonorrhea: NEGATIVE

## 2021-06-20 ENCOUNTER — Other Ambulatory Visit: Payer: Self-pay

## 2021-06-20 ENCOUNTER — Inpatient Hospital Stay (EMERGENCY_DEPARTMENT_HOSPITAL)
Admission: AD | Admit: 2021-06-20 | Discharge: 2021-06-20 | Disposition: A | Discharge status: Home / Self Care | Payer: 59 | Source: Home / Self Care | Attending: Family Medicine | Admitting: Family Medicine

## 2021-06-20 ENCOUNTER — Encounter (HOSPITAL_COMMUNITY): Payer: Self-pay | Admitting: Family Medicine

## 2021-06-20 DIAGNOSIS — Z3A37 37 weeks gestation of pregnancy: Secondary | ICD-10-CM | POA: Insufficient documentation

## 2021-06-20 DIAGNOSIS — O1002 Pre-existing essential hypertension complicating childbirth: Secondary | ICD-10-CM | POA: Diagnosis not present

## 2021-06-20 DIAGNOSIS — O10013 Pre-existing essential hypertension complicating pregnancy, third trimester: Secondary | ICD-10-CM | POA: Diagnosis not present

## 2021-06-20 DIAGNOSIS — O0990 Supervision of high risk pregnancy, unspecified, unspecified trimester: Secondary | ICD-10-CM

## 2021-06-20 DIAGNOSIS — O10919 Unspecified pre-existing hypertension complicating pregnancy, unspecified trimester: Secondary | ICD-10-CM

## 2021-06-20 DIAGNOSIS — O09523 Supervision of elderly multigravida, third trimester: Secondary | ICD-10-CM

## 2021-06-20 DIAGNOSIS — N3000 Acute cystitis without hematuria: Secondary | ICD-10-CM

## 2021-06-20 LAB — PROTEIN / CREATININE RATIO, URINE
Creatinine, Urine: 200.86 mg/dL
Protein Creatinine Ratio: 0.12 mg/mg{Cre} (ref 0.00–0.15)
Total Protein, Urine: 25 mg/dL

## 2021-06-20 LAB — URINALYSIS, ROUTINE W REFLEX MICROSCOPIC
Bilirubin Urine: NEGATIVE
Glucose, UA: NEGATIVE mg/dL
Ketones, ur: 5 mg/dL — AB
Nitrite: NEGATIVE
Protein, ur: NEGATIVE mg/dL
Specific Gravity, Urine: 1.018 (ref 1.005–1.030)
WBC, UA: >50 WBC/hpf — ABNORMAL HIGH (ref 0–5)
pH: 5 (ref 5.0–8.0)

## 2021-06-20 LAB — CBC
HCT: 31.6 % — ABNORMAL LOW (ref 36.0–46.0)
Hemoglobin: 10.5 g/dL — ABNORMAL LOW (ref 12.0–15.0)
MCH: 26.6 pg (ref 26.0–34.0)
MCHC: 33.2 g/dL (ref 30.0–36.0)
MCV: 80.2 fL (ref 80.0–100.0)
Platelets: 246 10*3/uL (ref 150–400)
RBC: 3.94 MIL/uL (ref 3.87–5.11)
RDW: 14.1 % (ref 11.5–15.5)
WBC: 6 10*3/uL (ref 4.0–10.5)
nRBC: 0 % (ref 0.0–0.2)

## 2021-06-20 LAB — COMPREHENSIVE METABOLIC PANEL
ALT: 32 U/L (ref 0–44)
AST: 32 U/L (ref 15–41)
Albumin: 2.5 g/dL — ABNORMAL LOW (ref 3.5–5.0)
Alkaline Phosphatase: 163 U/L — ABNORMAL HIGH (ref 38–126)
Anion gap: 8 (ref 5–15)
BUN: 7 mg/dL (ref 6–20)
CO2: 20 mmol/L — ABNORMAL LOW (ref 22–32)
Calcium: 8.6 mg/dL — ABNORMAL LOW (ref 8.9–10.3)
Chloride: 108 mmol/L (ref 98–111)
Creatinine, Ser: 0.78 mg/dL (ref 0.44–1.00)
GFR, Estimated: >60 mL/min (ref >60)
Glucose, Bld: 118 mg/dL — ABNORMAL HIGH (ref 70–99)
Potassium: 3.6 mmol/L (ref 3.5–5.1)
Sodium: 136 mmol/L (ref 135–145)
Total Bilirubin: 0.5 mg/dL (ref 0.3–1.2)
Total Protein: 6 g/dL — ABNORMAL LOW (ref 6.5–8.1)

## 2021-06-20 LAB — CULTURE, BETA STREP (GROUP B ONLY): Strep Gp B Culture: NEGATIVE

## 2021-06-20 MED ORDER — MICONAZOLE NITRATE 2 % VA CREA: 1.0000 | TOPICAL_CREAM | Freq: Every day | VAGINAL | 2 refills | Status: DC

## 2021-06-20 MED ORDER — ACETAMINOPHEN 325 MG PO TABS
650.0000 mg | ORAL_TABLET | Freq: Once | ORAL | Status: DC
  Filled 2021-06-20: qty 2

## 2021-06-20 MED ORDER — CEFADROXIL 500 MG PO CAPS: 500.0000 mg | ORAL_CAPSULE | Freq: Two times a day (BID) | ORAL | 0 refills | Status: DC

## 2021-06-20 NOTE — MAU Note (Signed)
**Note De-Identified Tammy Obfuscation** .  Tammy Mcgrath is a 40 y.o. at [redacted]w[redacted]d here in MAU reporting: states she took her BP yesterday and it was 180s/100s. She waited to take it again and it went down to 140/90s. Is having a headache that comes and goes. Denies blurry vision or epigastric pain. States the baby is not as active as she has been but is feeling movement. Denies VB or LOF.   Pain score: 1

## 2021-06-20 NOTE — MAU Provider Note (Signed)
History     CSN: [id number]  Arrival date and time: 06/20/21 0942   None     Chief Complaint  Patient presents with   Hypertension   Headache   Tammy Mcgrath is a 40 year old G3P1001 at [redacted]w[redacted]d with chronic hypertension who presents with a 4 day history of elevated blood pressure readings at home. Over the weekend, she recalls blood pressure readings elevated to 180/100 and 166/94. These episodes of blood pressure have been accompanied by headaches and feelings of unsteady balance. Yesterday she noticed some swelling in her hands and feet. Last night she had a headache that prevented her from sleeping which concerned her. This morning her her headache remained and her blood pressure was 143/94. Additionally, this morning she sensed decreased frequency and intensity of fetal movement. She endorses needed to urinate more frequently at night over the past few days.  She does not endorse changes in vision, vomiting, abdominal pain, fever, vaginal bleeding, leakage of fluid, or contractions.  Hypertension Associated symptoms include headaches and shortness of breath. Pertinent negatives include no chest pain.  Headache  Associated symptoms include dizziness and nausea. Pertinent negatives include no abdominal pain, fever or vomiting. Her past medical history is significant for hypertension.   OB History     Gravida  3   Para  1   Term  1   Preterm      AB  1   Living  1      SAB  1   IAB      Ectopic      Multiple      Live Births  1           Past Medical History:  Diagnosis Date   Chronic hypertension    Depression    not a formal dx, "is sad, not suicidal"   Eczema    Headache    Infection    UTI   Migraines     Past Surgical History:  Procedure Laterality Date   BREAST SURGERY     reduction   DILATION AND CURETTAGE OF UTERUS      Family History  Problem Relation Age of Onset   Hypertension Father    Stroke Father     Social History    Tobacco Use   Smoking status: Never   Smokeless tobacco: Never  Vaping Use   Vaping Use: Never used  Substance Use Topics   Alcohol use: Not Currently   Drug use: Not Currently    Types: Marijuana    Comment: last was ~3 months ago    Allergies: No Known Allergies  Medications Prior to Admission  Medication Sig Dispense Refill Last Dose   calcium carbonate (TUMS - DOSED IN MG ELEMENTAL CALCIUM) 500 MG chewable tablet Chew 1 tablet by mouth daily.   06/19/2021   NIFEdipine (PROCARDIA-XL/NIFEDICAL-XL) 30 MG 24 hr tablet Take 60 mg by mouth daily.   06/20/2021   Prenatal Vit-Fe Fumarate-FA (MULTIVITAMIN-PRENATAL) 27-0.8 MG TABS tablet Take 1 tablet by mouth daily at 12 noon.   06/19/2021    Review of Systems  Constitutional:  Negative for fever.  Eyes:  Negative for visual disturbance.  Respiratory:  Positive for shortness of breath.   Cardiovascular:  Negative for chest pain.  Gastrointestinal:  Positive for nausea. Negative for abdominal pain and vomiting.  Genitourinary:  Positive for frequency. Negative for decreased urine volume and vaginal bleeding.  Neurological:  Positive for dizziness and headaches.  Physical Exam  Blood pressure (!) 133/98, pulse (!) 120, temperature 97.9 F (36.6 C), temperature source Oral, resp. rate 18, weight 89.4 kg, last menstrual period 09/09/2020, SpO2 99 %.  Physical Exam Constitutional:      General: She is not in acute distress. HENT:     Head: Normocephalic and atraumatic.  Eyes:     Extraocular Movements: Extraocular movements intact.  Cardiovascular:     Rate and Rhythm: Regular rhythm. Tachycardia present.     Heart sounds: No murmur heard.   No friction rub. No gallop.  Pulmonary:     Effort: Pulmonary effort is normal.     Breath sounds: Normal breath sounds.  Abdominal:     Tenderness: There is no abdominal tenderness.  Musculoskeletal:     Cervical back: Normal range of motion.  Skin:    General: Skin is warm and dry.   Neurological:     Mental Status: She is alert.  Psychiatric:        Mood and Affect: Mood normal.        Speech: Speech normal.    MAU Course  Procedures  MDM After her arrival to the MAU, a CMP, CBC, UA, and urine protein/creatinine ratio were ordered to work up possible pre-eclampsia.  Assessment and Plan  Assessment: Tammy Mcgrath is a 40 year old G3P1001 at [redacted]w[redacted]d with chronic hypertension who presents with a 4 day history of elevated blood pressures and headaches. CBC, CMP, and urine protein/creatine ratio reassuring, platelets WNL. UA shows bacteruria and she is having increased frequency at night.  Plan: -Continue nifedipine 60 mg daily -Duricef 500 mg BID for 7 days -Miconazole cream QD for 7 days -F/u bacterial urine culture -Return precautions given for signs/symptoms concerning for pre-eclampsia -Outpatient appointment in later this week  [facility] 06/20/2021, 11:15 AM

## 2021-06-21 ENCOUNTER — Ambulatory Visit (HOSPITAL_BASED_OUTPATIENT_CLINIC_OR_DEPARTMENT_OTHER): Payer: 59

## 2021-06-21 ENCOUNTER — Ambulatory Visit (HOSPITAL_BASED_OUTPATIENT_CLINIC_OR_DEPARTMENT_OTHER): Payer: 59 | Admitting: Maternal & Fetal Medicine

## 2021-06-21 ENCOUNTER — Encounter (HOSPITAL_COMMUNITY): Payer: Self-pay | Admitting: Family Medicine

## 2021-06-21 ENCOUNTER — Inpatient Hospital Stay (HOSPITAL_COMMUNITY)
Admission: AD | Admit: 2021-06-21 | Discharge: 2021-06-24 | DRG: 806 | Disposition: A | Discharge status: Home / Self Care | Payer: 59 | Attending: Obstetrics and Gynecology | Admitting: Obstetrics and Gynecology

## 2021-06-21 ENCOUNTER — Other Ambulatory Visit: Payer: Self-pay

## 2021-06-21 ENCOUNTER — Ambulatory Visit: Payer: 59 | Admitting: *Deleted

## 2021-06-21 VITALS — BP 139/101 | HR 93

## 2021-06-21 VITALS — BP 148/104 | HR 95

## 2021-06-21 DIAGNOSIS — D62 Acute posthemorrhagic anemia: Secondary | ICD-10-CM | POA: Diagnosis not present

## 2021-06-21 DIAGNOSIS — O0990 Supervision of high risk pregnancy, unspecified, unspecified trimester: Secondary | ICD-10-CM

## 2021-06-21 DIAGNOSIS — O10913 Unspecified pre-existing hypertension complicating pregnancy, third trimester: Secondary | ICD-10-CM | POA: Insufficient documentation

## 2021-06-21 DIAGNOSIS — O10013 Pre-existing essential hypertension complicating pregnancy, third trimester: Secondary | ICD-10-CM | POA: Diagnosis not present

## 2021-06-21 DIAGNOSIS — O10919 Unspecified pre-existing hypertension complicating pregnancy, unspecified trimester: Secondary | ICD-10-CM | POA: Insufficient documentation

## 2021-06-21 DIAGNOSIS — O09523 Supervision of elderly multigravida, third trimester: Secondary | ICD-10-CM | POA: Insufficient documentation

## 2021-06-21 DIAGNOSIS — Z3490 Encounter for supervision of normal pregnancy, unspecified, unspecified trimester: Secondary | ICD-10-CM

## 2021-06-21 DIAGNOSIS — O1002 Pre-existing essential hypertension complicating childbirth: Principal | ICD-10-CM | POA: Diagnosis present

## 2021-06-21 DIAGNOSIS — Z20822 Contact with and (suspected) exposure to covid-19: Secondary | ICD-10-CM | POA: Diagnosis present

## 2021-06-21 DIAGNOSIS — O9081 Anemia of the puerperium: Secondary | ICD-10-CM | POA: Diagnosis not present

## 2021-06-21 DIAGNOSIS — Z3A38 38 weeks gestation of pregnancy: Secondary | ICD-10-CM | POA: Diagnosis not present

## 2021-06-21 DIAGNOSIS — O09529 Supervision of elderly multigravida, unspecified trimester: Secondary | ICD-10-CM

## 2021-06-21 DIAGNOSIS — O1092 Unspecified pre-existing hypertension complicating childbirth: Secondary | ICD-10-CM | POA: Diagnosis not present

## 2021-06-21 DIAGNOSIS — Z0001 Encounter for general adult medical examination with abnormal findings: Secondary | ICD-10-CM | POA: Diagnosis present

## 2021-06-21 LAB — TYPE AND SCREEN
ABO/RH(D): B POS
Antibody Screen: NEGATIVE

## 2021-06-21 LAB — COMPREHENSIVE METABOLIC PANEL
ALT: 31 U/L (ref 0–44)
AST: 31 U/L (ref 15–41)
Albumin: 2.7 g/dL — ABNORMAL LOW (ref 3.5–5.0)
Alkaline Phosphatase: 185 U/L — ABNORMAL HIGH (ref 38–126)
Anion gap: 8 (ref 5–15)
BUN: 7 mg/dL (ref 6–20)
CO2: 20 mmol/L — ABNORMAL LOW (ref 22–32)
Calcium: 8.7 mg/dL — ABNORMAL LOW (ref 8.9–10.3)
Chloride: 104 mmol/L (ref 98–111)
Creatinine, Ser: 0.71 mg/dL (ref 0.44–1.00)
GFR, Estimated: >60 mL/min (ref >60)
Glucose, Bld: 105 mg/dL — ABNORMAL HIGH (ref 70–99)
Potassium: 3.7 mmol/L (ref 3.5–5.1)
Sodium: 132 mmol/L — ABNORMAL LOW (ref 135–145)
Total Bilirubin: 0.4 mg/dL (ref 0.3–1.2)
Total Protein: 6.4 g/dL — ABNORMAL LOW (ref 6.5–8.1)

## 2021-06-21 LAB — CBC
HCT: 32.6 % — ABNORMAL LOW (ref 36.0–46.0)
Hemoglobin: 10.5 g/dL — ABNORMAL LOW (ref 12.0–15.0)
MCH: 26.2 pg (ref 26.0–34.0)
MCHC: 32.2 g/dL (ref 30.0–36.0)
MCV: 81.3 fL (ref 80.0–100.0)
Platelets: 243 10*3/uL (ref 150–400)
RBC: 4.01 MIL/uL (ref 3.87–5.11)
RDW: 14.4 % (ref 11.5–15.5)
WBC: 5.8 10*3/uL (ref 4.0–10.5)
nRBC: 0 % (ref 0.0–0.2)

## 2021-06-21 LAB — RAPID HIV SCREEN (HIV 1/2 AB+AG)
HIV 1/2 Antibodies: NONREACTIVE
HIV-1 P24 Antigen - HIV24: NONREACTIVE

## 2021-06-21 LAB — RESP PANEL BY RT-PCR (FLU A&B, COVID) ARPGX2
Influenza A by PCR: NEGATIVE
Influenza B by PCR: NEGATIVE
SARS Coronavirus 2 by RT PCR: NEGATIVE

## 2021-06-21 LAB — PROTEIN / CREATININE RATIO, URINE
Creatinine, Urine: 33.16 mg/dL
Total Protein, Urine: <6 mg/dL

## 2021-06-21 MED ORDER — ONDANSETRON HCL 4 MG/2ML IJ SOLN
4.0000 mg | Freq: Four times a day (QID) | INTRAMUSCULAR | Status: DC | PRN
  Administered 2021-06-21 – 2021-06-22 (×3): 4 mg via INTRAVENOUS
  Filled 2021-06-21 (×3): qty 2

## 2021-06-21 MED ORDER — OXYCODONE-ACETAMINOPHEN 5-325 MG PO TABS: 1.0000 | ORAL_TABLET | ORAL | Status: DC | PRN

## 2021-06-21 MED ORDER — OXYTOCIN-SODIUM CHLORIDE 30-0.9 UT/500ML-% IV SOLN
1.0000 m[IU]/min | INTRAVENOUS | Status: DC
Start: 2021-06-21 — End: 2021-06-22
  Administered 2021-06-21 – 2021-06-22 (×2): 2 m[IU]/min via INTRAVENOUS
  Filled 2021-06-21: qty 500

## 2021-06-21 MED ORDER — NIFEDIPINE ER OSMOTIC RELEASE 30 MG PO TB24
60.0000 mg | ORAL_TABLET | Freq: Every day | ORAL | Status: DC
  Administered 2021-06-22 – 2021-06-24 (×3): 60 mg via ORAL
  Filled 2021-06-21: qty 2
  Filled 2021-06-21 (×2): qty 1
  Filled 2021-06-21: qty 2

## 2021-06-21 MED ORDER — SOD CITRATE-CITRIC ACID 500-334 MG/5ML PO SOLN
30.0000 mL | ORAL | Status: DC | PRN
  Administered 2021-06-22: 30 mL via ORAL
  Filled 2021-06-21: qty 30

## 2021-06-21 MED ORDER — FENTANYL CITRATE (PF) 100 MCG/2ML IJ SOLN
50.0000 ug | Freq: Once | INTRAMUSCULAR | Status: AC
  Administered 2021-06-21: 50 ug via INTRAVENOUS

## 2021-06-21 MED ORDER — MISOPROSTOL 50MCG HALF TABLET
50.0000 ug | ORAL_TABLET | ORAL | Status: DC
  Administered 2021-06-21 (×2): 50 ug via BUCCAL
  Filled 2021-06-21 (×4): qty 1

## 2021-06-21 MED ORDER — LACTATED RINGERS IV SOLN
500.0000 mL | INTRAVENOUS | Status: DC | PRN
  Administered 2021-06-22: 500 mL via INTRAVENOUS

## 2021-06-21 MED ORDER — TERBUTALINE SULFATE 1 MG/ML IJ SOLN: 0.2500 mg | Freq: Once | INTRAMUSCULAR | Status: DC | PRN

## 2021-06-21 MED ORDER — OXYTOCIN-SODIUM CHLORIDE 30-0.9 UT/500ML-% IV SOLN
2.5000 [IU]/h | INTRAVENOUS | Status: DC
  Administered 2021-06-22: 2.5 [IU]/h via INTRAVENOUS

## 2021-06-21 MED ORDER — OXYTOCIN BOLUS FROM INFUSION
333.0000 mL | Freq: Once | INTRAVENOUS | Status: AC
  Administered 2021-06-22: 333 mL via INTRAVENOUS

## 2021-06-21 MED ORDER — OXYCODONE-ACETAMINOPHEN 5-325 MG PO TABS: 2.0000 | ORAL_TABLET | ORAL | Status: DC | PRN

## 2021-06-21 MED ORDER — FENTANYL CITRATE (PF) 100 MCG/2ML IJ SOLN
INTRAMUSCULAR | Status: AC
  Filled 2021-06-21: qty 2

## 2021-06-21 MED ORDER — MISOPROSTOL 50MCG HALF TABLET
ORAL_TABLET | ORAL | Status: AC
  Filled 2021-06-21: qty 1

## 2021-06-21 MED ORDER — ACETAMINOPHEN 325 MG PO TABS: 650.0000 mg | ORAL_TABLET | ORAL | Status: DC | PRN

## 2021-06-21 MED ORDER — LIDOCAINE HCL (PF) 1 % IJ SOLN: 30.0000 mL | INTRAMUSCULAR | Status: DC | PRN

## 2021-06-21 MED ORDER — LACTATED RINGERS IV SOLN
INTRAVENOUS | Status: DC
  Administered 2021-06-21 (×2): 125 mL/h via INTRAVENOUS

## 2021-06-21 NOTE — Progress Notes (Signed)
Labor Progress Note Tammy Mcgrath is a 40 y.o. G3P1011 at [redacted]w[redacted]d admitted for IOL due to [facility].  S: Resting comfortably in bed, sleeping.  O:  BP (!) 156/88   Pulse 88   Temp 98.9 F (37.2 C) (Axillary)   Resp 16   Ht 5\' 9"  (1.753 m)   Wt 90.4 kg   LMP 09/09/2020   BMI 29.42 kg/m  EFM: Baseline 130 /moderate variability /accels, no decels  CVE: Dilation: 1 Effacement (%): 50 Cervical Position: Posterior Station: -3 Presentation: Vertex Exam by:: [email or web address] RN   A&P: 40 y.o. G3P1011 [redacted]w[redacted]d admitted for IOL due to [redacted]w[redacted]d. #Labor: Progressing well. Deferred cervical check at this time. Will be due for next check within the next hour. Will offer FB if appropriate at next check and if she is agreeable. #Pain: As needed. Epidural available if needed. #FWB: Cat I #GBS negative  #cHTN: Took home procardia xl 60 this AM. Continues to be uncontrolled with systolics in 40s-50s but no severe range pressures. Next dose ordered for tomorrow. Will continue to monitor.  [place], MD 9:39 PM

## 2021-06-21 NOTE — H&P (Signed)
OBSTETRIC ADMISSION HISTORY AND PHYSICAL  Tammy Mcgrath is a 40 y.o. female G3P1011 with IUP at [redacted]w[redacted]d by 10 week [place] presenting for IOL due to chronic hypertension. She was seen in MFM with SBP 140's, opted to proceed with induction. She reports +FMs, No LOF, no VB, no blurry vision, or peripheral edema, and RUQ pain. She does have a mild HA that she attributes to not eating yet today. She plans on breast feeding. She is undecided for birth control. She received her prenatal care at  [facility].    Dating: By 10 week [place] --->  Estimated Date of Delivery: 07/05/21  Sono:    @[redacted]w[redacted]d , CWD, normal anatomy, cephalic presentation, 3327g, EFW   Prenatal History/Complications:  --Chronic hypertension on nifedipine   Past Medical History: Past Medical History:  Diagnosis Date   Chronic hypertension    Depression    not a formal dx, "is sad, not suicidal"   Eczema    Headache    Infection    UTI   Migraines     Past Surgical History: Past Surgical History:  Procedure Laterality Date   BREAST SURGERY     reduction   DILATION AND CURETTAGE OF UTERUS      Obstetrical History: OB History     Gravida  3   Para  1   Term  1   Preterm      AB  1   Living  1      SAB  1   IAB      Ectopic      Multiple      Live Births  1           Social History Social History   Socioeconomic History   Marital status: Single    Spouse name: Not on file   Number of children: Not on file   Years of education: Not on file   Highest education level: Not on file  Occupational History   Not on file  Tobacco Use   Smoking status: Never   Smokeless tobacco: Never  Vaping Use   Vaping Use: Never used  Substance and Sexual Activity   Alcohol use: Not Currently   Drug use: Not Currently    Types: Marijuana    Comment: last was ~3 months ago   Sexual activity: Yes  Other Topics Concern   Not on file  Social History Narrative   Not on file   Social Determinants of  Health   Financial Resource Strain: Not on file  Food Insecurity: Not on file  Transportation Needs: Not on file  Physical Activity: Not on file  Stress: Not on file  Social Connections: Not on file    Family History: Family History  Problem Relation Age of Onset   Hypertension Father    Stroke Father     Allergies: No Known Allergies  Medications Prior to Admission  Medication Sig Dispense Refill Last Dose   calcium carbonate (TUMS - DOSED IN MG ELEMENTAL CALCIUM) 500 MG chewable tablet Chew 1 tablet by mouth daily.      cefadroxil (DURICEF) 500 MG capsule Take 1 capsule (500 mg total) by mouth 2 (two) times daily. 14 capsule 0    miconazole (MONISTAT 7) 2 % vaginal cream Place 1 Applicatorful vaginally at bedtime. Apply for seven nights 30 g 2    NIFEdipine (PROCARDIA-XL/NIFEDICAL-XL) 30 MG 24 hr tablet Take 60 mg by mouth daily.      Prenatal Vit-Fe Fumarate-FA (MULTIVITAMIN-PRENATAL)  27-0.8 MG TABS tablet Take 1 tablet by mouth daily at 12 noon.        Review of Systems   All systems reviewed and negative except as stated in HPI  Blood pressure (!) 137/105, pulse (!) 103, temperature 98.7 F (37.1 C), temperature source Oral, resp. rate 18, height 5\' 9"  (1.753 m), weight 90.4 kg, last menstrual period 09/09/2020. General appearance: alert, cooperative, and no distress Lungs: Normal WOB  Heart: regular rate and rhythm Abdomen: soft, non-tender; gravid appropriate for gestational age  Extremities: no sign of DVT Presentation: cephalic Fetal monitoringBaseline: 130 bpm, Variability: Good {> 6 bpm), Accelerations: Reactive, and Decelerations: Absent Uterine activityNone    Dilation: Fingertip Effacement (%): Thick Cervical Position: Posterior Station: -3 Presentation: Vertex Exam by:: MD [email or web address]   Prenatal labs: ABO, Rh: --/--/PENDING (11/08 1114) Antibody: PENDING (11/08 1114) Rubella:  Immune  RPR:  Non-reactive  HBsAg:  Negative  HIV:   Non-reactive  GBS:  Negative/-- (11/03 0849)  3 hr Glucola normal  Genetic screening  declined  Anatomy [birth year]1993 normal   Prenatal Transfer Tool  Maternal Diabetes: No Genetic Screening: Declined Maternal Ultrasounds/Referrals: Normal Fetal Ultrasounds or other Referrals:  None Maternal Substance Abuse:  No Significant Maternal Medications:  Meds include: Other:  Significant Maternal Lab Results: Group B Strep negative  Results for orders placed or performed during the hospital encounter of 06/21/21 (from the past 24 hour(s))  CBC   Collection Time: 06/21/21 11:14 AM  Result Value Ref Range   WBC 5.8 4.0 - 10.5 K/uL   RBC 4.01 3.87 - 5.11 MIL/uL   Hemoglobin 10.5 (L) 12.0 - 15.0 g/dL   HCT 13/08/22 (L) 61.6 - 07.3 %   MCV 81.3 80.0 - 100.0 fL   MCH 26.2 26.0 - 34.0 pg   MCHC 32.2 30.0 - 36.0 g/dL   RDW 71.0 62.6 - 94.8 %   Platelets 243 150 - 400 K/uL   nRBC 0.0 0.0 - 0.2 %  Type and screen   Collection Time: 06/21/21 11:14 AM  Result Value Ref Range   ABO/RH(D) PENDING    Antibody Screen PENDING    Sample Expiration      06/24/2021,2359 Performed at [facility], [address], [place], [place]     Patient Active Problem List   Diagnosis Date Noted   Encounter for induction of labor 06/21/2021   Chronic hypertension in pregnancy 05/13/2021   AMA (advanced maternal age) multigravida 35+ 05/12/2021   Supervision of high risk pregnancy, antepartum 05/12/2021    Assessment/Plan:  Tammy Mcgrath is a 40 y.o. G3P1011 at [redacted]w[redacted]d here for IOL due to chronic hypertension on nifedipine with worsening control.   #Labor: Cervix to maternal left and quite uncomfortable with exam. Plan to start with buccal cytotec. Consider FB on next check.  #Pain: PRN  #FWB: Cat I  #ID: GBS negative  #MOF: breastfeeding + via pump  #MOC: Undecided and planning on abstinence while she contemplates, aware of options  #Circ: N/A girl   #Chronic hypertension: No severe ranges. Slight headache currently,  will eat first and see if resolves. If not, will treat and reassess. Already had home procardia. Will obtain baseline pre-e labs.    [redacted]w[redacted]d, DO  06/21/2021, 12:09 PM

## 2021-06-21 NOTE — Progress Notes (Signed)
MFM Brief Note  Tammy Mcgrath is a 40 yo G3P1 who is seen at 60 w 0 d at the request of Dr. Candelaria Celeste  Follow up growth due chronic hypertension on procardia Normal interval growth with measurements consistent with dates Good fetal movement and amniotic fluid volume   Blood pressure today is 139/101 mmHg her repeat was 140/101  mmHg. She denies s/sx of preeclampsia. However, she was seen in the MAU for hypertension and a headache that resolved.   Vitals with BMI 06/21/2021 06/21/2021 06/21/2021  Height - - -  Weight - - -  BMI - - -  Systolic 148 140 106  Diastolic 104 101 269  Pulse - 95 93     I discussed her blood pressure today and recommended delivery at first available appointment.  I discussed the plan of care with Dr. Adrian Blackwater and he was in agreement have her go to the hospital for IOL. Tammy Mcgrath agreed with this plan and will go to L&D. I also called the MAU to make sure that they were aware just in case she shows up there.   I spent 20 minutes with > 50% in face to face consultation and care coordination.  All questions answered  Novella Olive, MD  Novella Olive, MD.

## 2021-06-21 NOTE — Progress Notes (Signed)
Labor Progress Note Tammy Mcgrath is a 40 y.o. G3P1011 at [redacted]w[redacted]d presented for IOL due to chronic hypertension.   O:  BP (!) 156/88   Pulse 88   Temp 98.9 F (37.2 C) (Axillary)   Resp 16   Ht 5\' 9"  (1.753 m)   Wt 90.4 kg   LMP 09/09/2020   BMI 29.42 kg/m  EFM: 130/mod/15x15/none  CVE: Dilation: 1 Effacement (%): 50 Cervical Position: Posterior Station: -3 Presentation: Vertex Exam by:: [email or web address] RN   A&P: 40 y.o. G3P1011 [redacted]w[redacted]d  #Labor: Some progression since last check, checked per RN and given additional cytotec. Will consider FB placement on next check if still amenable.  #Pain: PRN #FWB: Cat 1  #GBS negative  Chronic hypertension: Uncontrolled, however no severe range Bps. Already took her procardia 60 XL this am. Asymptomatic. Pre-e labs WNL. Cont to monitor.   [redacted]w[redacted]d, DO 7:33 PM

## 2021-06-21 NOTE — Progress Notes (Signed)
Tammy Mcgrath is a 40 y.o. G3P1011 at [redacted]w[redacted]d admitted for induction of labor due to gHTN.  Subjective: Reports feeling contractions get stronger. Would like some IV pain medicine. Denies headaches or vision changes.  Objective: BP (!) 148/95   Pulse 92   Temp 98.8 F (37.1 C) (Axillary)   Resp 16   Ht 5\' 9"  (1.753 m)   Wt 90.4 kg   LMP 09/09/2020   BMI 29.42 kg/m  No intake/output data recorded. No intake/output data recorded.  FHT:  FHR: 130 bpm, variability: moderate,  accelerations:  Present,  decelerations:  Absent UC:   regular, every 3-5 minutes SVE:   Dilation: 3.5 Effacement (%): 40 Station: -2 Exam by:: Dr. [email or web address]  Labs: Lab Results  Component Value Date   WBC 5.8 06/21/2021   HGB 10.5 (L) 06/21/2021   HCT 32.6 (L) 06/21/2021   MCV 81.3 06/21/2021   PLT 243 06/21/2021    Assessment / Plan: Induction of labor due to gHTN  Labor:  S/p Cytotec x2. Now 3.5cm and 40-50%effaced, and station descended to -2. Will start pitocin Preeclampsia:  no signs or symptoms of toxicity and labs stable Fetal Wellbeing:  Category I Pain Control:  IV pain meds and plans epidural I/D:   GBS neg  13/03/2021 06/21/2021, 10:29 PM

## 2021-06-22 ENCOUNTER — Inpatient Hospital Stay (HOSPITAL_COMMUNITY): Payer: 59 | Admitting: Anesthesiology

## 2021-06-22 ENCOUNTER — Encounter: Payer: 59 | Admitting: Family Medicine

## 2021-06-22 ENCOUNTER — Encounter (HOSPITAL_COMMUNITY): Payer: Self-pay | Admitting: Family Medicine

## 2021-06-22 DIAGNOSIS — O1092 Unspecified pre-existing hypertension complicating childbirth: Secondary | ICD-10-CM

## 2021-06-22 DIAGNOSIS — Z3A38 38 weeks gestation of pregnancy: Secondary | ICD-10-CM

## 2021-06-22 LAB — RPR: RPR Ser Ql: NONREACTIVE

## 2021-06-22 MED ORDER — EPHEDRINE 5 MG/ML INJ: 10.0000 mg | INTRAVENOUS | Status: DC | PRN

## 2021-06-22 MED ORDER — PHENYLEPHRINE 40 MCG/ML (10ML) SYRINGE FOR IV PUSH (FOR BLOOD PRESSURE SUPPORT): 80.0000 ug | PREFILLED_SYRINGE | INTRAVENOUS | Status: DC | PRN

## 2021-06-22 MED ORDER — ACETAMINOPHEN 325 MG PO TABS
650.0000 mg | ORAL_TABLET | ORAL | Status: DC | PRN
  Administered 2021-06-22: 650 mg via ORAL
  Filled 2021-06-22: qty 2

## 2021-06-22 MED ORDER — BENZOCAINE-MENTHOL 20-0.5 % EX AERO
1.0000 "application " | INHALATION_SPRAY | CUTANEOUS | Status: DC | PRN
  Administered 2021-06-22: 1 via TOPICAL
  Filled 2021-06-22: qty 56

## 2021-06-22 MED ORDER — SIMETHICONE 80 MG PO CHEW: 80.0000 mg | CHEWABLE_TABLET | ORAL | Status: DC | PRN

## 2021-06-22 MED ORDER — IBUPROFEN 600 MG PO TABS
600.0000 mg | ORAL_TABLET | Freq: Four times a day (QID) | ORAL | Status: DC
  Administered 2021-06-23: 600 mg via ORAL
  Filled 2021-06-22 (×5): qty 1

## 2021-06-22 MED ORDER — DIBUCAINE (PERIANAL) 1 % EX OINT: 1.0000 "application " | TOPICAL_OINTMENT | CUTANEOUS | Status: DC | PRN

## 2021-06-22 MED ORDER — FENTANYL-BUPIVACAINE-NACL 0.5-0.125-0.9 MG/250ML-% EP SOLN
12.0000 mL/h | EPIDURAL | Status: DC | PRN
  Administered 2021-06-22: 12 mL/h via EPIDURAL
  Filled 2021-06-22: qty 250

## 2021-06-22 MED ORDER — ONDANSETRON HCL 4 MG/2ML IJ SOLN: 4.0000 mg | INTRAMUSCULAR | Status: DC | PRN

## 2021-06-22 MED ORDER — DIPHENHYDRAMINE HCL 25 MG PO CAPS: 25.0000 mg | ORAL_CAPSULE | Freq: Four times a day (QID) | ORAL | Status: DC | PRN

## 2021-06-22 MED ORDER — SENNOSIDES-DOCUSATE SODIUM 8.6-50 MG PO TABS
2.0000 | ORAL_TABLET | Freq: Every day | ORAL | Status: DC
  Administered 2021-06-23 – 2021-06-24 (×2): 2 via ORAL
  Filled 2021-06-22 (×2): qty 2

## 2021-06-22 MED ORDER — WITCH HAZEL-GLYCERIN EX PADS: 1.0000 "application " | MEDICATED_PAD | CUTANEOUS | Status: DC | PRN

## 2021-06-22 MED ORDER — LIDOCAINE HCL (PF) 1 % IJ SOLN
INTRAMUSCULAR | Status: DC | PRN
  Administered 2021-06-22 (×2): 4 mL via EPIDURAL

## 2021-06-22 MED ORDER — LACTATED RINGERS IV SOLN: 500.0000 mL | Freq: Once | INTRAVENOUS | Status: DC

## 2021-06-22 MED ORDER — FAMOTIDINE 20 MG PO TABS
20.0000 mg | ORAL_TABLET | Freq: Once | ORAL | Status: AC
  Administered 2021-06-22: 20 mg via ORAL
  Filled 2021-06-22: qty 1

## 2021-06-22 MED ORDER — DIPHENHYDRAMINE HCL 50 MG/ML IJ SOLN: 12.5000 mg | INTRAMUSCULAR | Status: DC | PRN

## 2021-06-22 MED ORDER — TETANUS-DIPHTH-ACELL PERTUSSIS 5-2.5-18.5 LF-MCG/0.5 IM SUSY: 0.5000 mL | PREFILLED_SYRINGE | Freq: Once | INTRAMUSCULAR | Status: DC

## 2021-06-22 MED ORDER — COCONUT OIL OIL: 1.0000 "application " | TOPICAL_OIL | Status: DC | PRN

## 2021-06-22 MED ORDER — PRENATAL MULTIVITAMIN CH
1.0000 | ORAL_TABLET | Freq: Every day | ORAL | Status: DC
  Filled 2021-06-22 (×2): qty 1

## 2021-06-22 MED ORDER — LACTATED RINGERS IV SOLN
500.0000 mL | Freq: Once | INTRAVENOUS | Status: AC
  Administered 2021-06-22: 500 mL via INTRAVENOUS

## 2021-06-22 MED ORDER — LACTATED RINGERS AMNIOINFUSION: INTRAVENOUS | Status: DC

## 2021-06-22 MED ORDER — ONDANSETRON HCL 4 MG PO TABS: 4.0000 mg | ORAL_TABLET | ORAL | Status: DC | PRN

## 2021-06-22 NOTE — Anesthesia Preprocedure Evaluation (Signed)
Anesthesia Evaluation  Patient identified by MRN, date of birth, ID band Patient awake    Reviewed: Allergy & Precautions, Patient's Chart, lab work & pertinent test results  History of Anesthesia Complications Negative for: history of anesthetic complications  Airway Mallampati: II  TM Distance: >3 FB Neck ROM: Full    Dental no notable dental hx.    Pulmonary neg pulmonary ROS,    Pulmonary exam normal        Cardiovascular hypertension, Pt. on medications Normal cardiovascular exam     Neuro/Psych  Headaches, Depression    GI/Hepatic negative GI ROS, Neg liver ROS,   Endo/Other  negative endocrine ROS  Renal/GU negative Renal ROS  negative genitourinary   Musculoskeletal negative musculoskeletal ROS (+)   Abdominal   Peds  Hematology negative hematology ROS (+)   Anesthesia Other Findings Day of surgery medications reviewed with patient.  Reproductive/Obstetrics (+) Pregnancy                             Anesthesia Physical Anesthesia Plan  ASA: 2  Anesthesia Plan: Epidural   Post-op Pain Management:    Induction:   PONV Risk Score and Plan: Treatment may vary due to age or medical condition  Airway Management Planned: Natural Airway  Additional Equipment:   Intra-op Plan:   Post-operative Plan:   Informed Consent: I have reviewed the patients History and Physical, chart, labs and discussed the procedure including the risks, benefits and alternatives for the proposed anesthesia with the patient or authorized representative who has indicated his/her understanding and acceptance.       Plan Discussed with:   Anesthesia Plan Comments:         Anesthesia Quick Evaluation

## 2021-06-22 NOTE — Discharge Summary (Signed)
Postpartum Discharge Summary      Patient Name: Tammy Mcgrath DOB: Jan 04, 1981 MRN: [id number]  Date of admission: 06/21/2021 Delivery date:06/22/2021  Delivering provider: Renard Matter  Date of discharge: 06/24/2021  Admitting diagnosis: Encounter for induction of labor [Z34.90] Intrauterine pregnancy: [redacted]w[redacted]d    Secondary diagnosis:  Active Problems:   AMA (advanced maternal age) multigravida 35+   Supervision of high risk pregnancy, antepartum   Chronic hypertension in pregnancy   Encounter for induction of labor   Vaginal delivery  Additional problems: None    Discharge diagnosis: Term Pregnancy Delivered and Gestational Hypertension                                              Post partum procedures: Augmentation: AROM, Pitocin, and Cytotec Complications: None  Hospital course: Induction of Labor With Vaginal Delivery   40y.o. yo G3P1011 at 379w1das admitted to the hospital 06/21/2021 for induction of labor.  Indication for induction: Gestational hypertension.  Patient had an uncomplicated labor course as follows: Membrane Rupture Time/Date: 6:07 AM ,06/22/2021   Delivery Method:Vaginal, Spontaneous  Episiotomy: None  Lacerations:  Periurethral;1st degree;Perineal  Details of delivery can be found in separate delivery note.  Patient had a routine postpartum course. Patient is discharged home 06/24/21.  Newborn Data: Birth date:06/22/2021  Birth time:8:56 AM  Gender:Female  Living status:Living  Apgars:8 ,9  Weight:3175 g   Magnesium Sulfate received: No BMZ received: No Rhophylac:N/A MMR:N/A T-DaP:Given prenatally Flu: Yes Transfusion:No  Physical exam  Vitals:   06/23/21 1110 06/23/21 1342 06/23/21 1705 06/23/21 2000  BP: (!) 126/94 123/89 109/72 119/88  Pulse: 88 97 84 99  Resp:  _0 Temp:  98.2 F (36.8 C)  98.7 F (37.1 C)  TempSrc:  Oral  Oral  SpO2:    99%  Weight:      Height:       General: alert, cooperative, and no  distress Lochia: appropriate Uterine Fundus: firm Incision: N/A DVT Evaluation: No evidence of DVT seen on physical exam. Negative Homan's sign. No cords or calf tenderness. No significant calf/ankle edema. Labs: Lab Results  Component Value Date   WBC 8.9 06/23/2021   HGB 8.9 (L) 06/23/2021   HCT 26.9 (L) 06/23/2021   MCV 79.8 (L) 06/23/2021   PLT 210 06/23/2021   CMP Latest Ref Rng & Units 06/21/2021  Glucose 70 - 99 mg/dL 105(H)  BUN 6 - 20 mg/dL 7  Creatinine 0.44 - 1.00 mg/dL 0.71  Sodium 135 - 145 mmol/L 132(L)  Potassium 3.5 - 5.1 mmol/L 3.7  Chloride 98 - 111 mmol/L 104  CO2 22 - 32 mmol/L 20(L)  Calcium 8.9 - 10.3 mg/dL 8.7(L)  Total Protein 6.5 - 8.1 g/dL 6.4(L)  Total Bilirubin 0.3 - 1.2 mg/dL 0.4  Alkaline Phos 38 - 126 U/L 185(H)  AST 15 - 41 U/L 31  ALT 0 - 44 U/L 31   Edinburgh Score: No flowsheet data found.   After visit meds:  Allergies as of 06/24/2021   No Known Allergies      Medication List     STOP taking these medications    calcium carbonate 500 MG chewable tablet Commonly known as: TUMS - dosed in mg elemental calcium   miconazole 2 % vaginal cream Commonly known as: MONISTAT 7  TAKE these medications    cefadroxil 500 MG capsule Commonly known as: DURICEF Take 1 capsule (500 mg total) by mouth 2 (two) times daily.   ibuprofen 600 MG tablet Commonly known as: ADVIL Take 1 tablet (600 mg total) by mouth every 6 (six) hours.   multivitamin-prenatal 27-0.8 MG Tabs tablet Take 1 tablet by mouth daily at 12 noon.   NIFEdipine 30 MG 24 hr tablet Commonly known as: PROCARDIA-XL/NIFEDICAL-XL Take 60 mg by mouth daily.         Discharge home in stable condition Infant Feeding: Bottle and Breast Infant Disposition:home with mother Discharge instruction: per After Visit Summary and Postpartum booklet. Activity: Advance as tolerated. Pelvic rest for 6 weeks.  Diet: routine diet Future Appointments: Future  Appointments  Date Time Provider [place]  06/30/2021 10:00 AM [facility] NURSE [facility] None  07/28/2021 11:15 AM Truett Mainland, DO [facility] None   Follow up Visit: Message sent to [facility] by Dr. Cy Blamer on 11/9  Please schedule this patient for a In person postpartum visit in 4 weeks with the following provider: Any provider. Additional Postpartum F/U:BP check 1 week  High risk pregnancy complicated by: HTN Delivery mode:  Vaginal, Spontaneous  Anticipated Birth Control:  Unsure   06/24/2021 Christin Fudge, CNM

## 2021-06-22 NOTE — Anesthesia Procedure Notes (Signed)
Epidural Patient location during procedure: OB Start time: 06/22/2021 2:10 AM End time: 06/22/2021 2:15 AM  Staffing Anesthesiologist: Kaylyn Layer, MD Performed: anesthesiologist   Preanesthetic Checklist Completed: patient identified, IV checked, risks and benefits discussed, monitors and equipment checked, pre-op evaluation and timeout performed  Epidural Patient position: sitting Prep: DuraPrep and site prepped and draped Patient monitoring: continuous pulse ox, blood pressure and heart rate Approach: midline Location: L3-L4 Injection technique: LOR air  Needle:  Needle type: Tuohy  Needle gauge: 17 G Needle length: 9 cm Needle insertion depth: 6 cm Catheter type: closed end flexible Catheter size: 19 Gauge Catheter at skin depth: 11 cm Test dose: negative and Other (1% lidocaine)  Assessment Events: blood not aspirated, injection not painful, no injection resistance, no paresthesia and negative IV test  Additional Notes Patient identified. Risks, benefits, and alternatives discussed with patient including but not limited to bleeding, infection, nerve damage, paralysis, failed block, incomplete pain control, headache, blood pressure changes, nausea, vomiting, reactions to medication, itching, and postpartum back pain. Confirmed with bedside nurse the patient's most recent platelet count. Confirmed with patient that they are not currently taking any anticoagulation, have any bleeding history, or any family history of bleeding disorders. Patient expressed understanding and wished to proceed. All questions were answered. Sterile technique was used throughout the entire procedure. Please see nursing notes for vital signs.   2 attempts required (somewhat difficult due to inadequate patient positioning). Test dose was given through epidural catheter and negative prior to continuing to dose epidural or start infusion. Warning signs of high block given to the patient including  shortness of breath, tingling/numbness in hands, complete motor block, or any concerning symptoms with instructions to call for help. Patient was given instructions on fall risk and not to get out of bed. All questions and concerns addressed with instructions to call with any issues or inadequate analgesia.  Reason for block:procedure for pain

## 2021-06-22 NOTE — Progress Notes (Signed)
Chart reviewed - agree with CMA/RN documentation.  ° °

## 2021-06-22 NOTE — Lactation Note (Signed)
This note was copied from a baby's chart. Lactation Consultation Note  Patient Name: Tammy Mcgrath [id number] Date: 06/22/2021 Reason for consult: Follow-up assessment;Mother's request;Difficult latch Age:38 hours  LC worked on latching, nipples are wide in diameter and short shafted. Best latch position infant cross cradle prone on her stomach. At the time of visit, not able to get much depth even in prone. With pumping with electric, elongate her nipples.   RN, Audrie Lia, working with Mom to get her pumping. Mom for now pace bottle feeding her EBM first followed by [facility] offering more volume as infant not able to latch.  Mom post pumping DEBP q hrs for 15 min.  BF supplementation guide provided.  All questions answered at the end of the visit.     Maternal Data Has patient been taught Hand Expression?: Yes Does the patient have breastfeeding experience prior to this delivery?: No  Feeding Mother's Current Feeding Choice: Breast Milk and Donor Milk  LATCH Score Latch: Repeated attempts needed to sustain latch, nipple held in mouth throughout feeding, stimulation needed to elicit sucking reflex.  Audible Swallowing: Spontaneous and intermittent  Type of Nipple: Flat (Nipples are wider than longer and thick short shafted making difficult to latch. Infant did a few sucks with 24 NS tight fit causing pain, discontinued)  Comfort (Breast/Nipple): Filling, red/small blisters or bruises, mild/mod discomfort  Hold (Positioning): Assistance needed to correctly position infant at breast and maintain latch.  LATCH Score: 6   Lactation Tools Discussed/Used Tools: Pump;Flanges;Nipple Shields;Coconut oil Nipple shield size: 24 (LC attemped to latch with help of 24 NS. Mom large nipples that wide, size 20 NS tight fit and 24 NS pinching. Infant not able to get much depth even in prone position. Mom to work on extending her nipples with use of pump.) Flange Size: 27 Breast pump type:  Double-Electric Breast Pump Pump Education: Setup, frequency, and cleaning;Milk Storage Reason for Pumping: increase stimulation Pumping frequency: every 3hrs for  Interventions Interventions: Breast feeding basics reviewed;Education;Support pillows;Assisted with latch;Position options;Pace feeding;Skin to skin;Expressed milk;Breast massage;Coconut oil;Infant Driven Feeding Algorithm education;Breast compression;DEBP;Adjust position  Discharge WIC Program: No  Consult Status Consult Status: Follow-up Date: 06/23/21 Follow-up type: In-patient    Gregary Blackard  Nicholson-Springer 06/22/2021, 4:50 PM

## 2021-06-22 NOTE — Progress Notes (Signed)
Tammy Mcgrath is a 40 y.o. G3P1011 at [redacted]w[redacted]d admitted for induction of labor due to Hypertension.  Subjective: Sleeping comfortably. Denies pain. Reports epidural working well. Ok with AROM at this time  Objective: BP 122/80   Pulse 92   Temp 98.8 F (37.1 C) (Axillary)   Resp 16   Ht 5\' 9"  (1.753 m)   Wt 90.4 kg   LMP 09/09/2020   SpO2 99%   BMI 29.42 kg/m  No intake/output data recorded. No intake/output data recorded.  FHT:  FHR: 120 bpm, variability: moderate,  accelerations:  Present,  decelerations:  Absent UC:   regular, every 3-4 minutes SVE:   Dilation: 6 Effacement (%): 50 Station: -2 Exam by:: Dr. [email or web address]  Labs: Lab Results  Component Value Date   WBC 5.8 06/21/2021   HGB 10.5 (L) 06/21/2021   HCT 32.6 (L) 06/21/2021   MCV 81.3 06/21/2021   PLT 243 06/21/2021    Assessment / Plan: Induction of labor due to gestational hypertension,  progressing well on pitocin  Labor:  AROM during current check and IUPC placed to better trace contractions to help with pitocin titration Preeclampsia:  no signs or symptoms of toxicity and BP now normotensive in 120s/80s Fetal Wellbeing:  Category I Pain Control:  Epidural I/D:   GBS neg  13/03/2021 06/22/2021, 6:19 AM

## 2021-06-22 NOTE — Lactation Note (Signed)
This note was copied from a baby's chart. Lactation Consultation Note  Patient Name: Tammy Mcgrath [id number] Date: 06/22/2021 Reason for consult: L&D Initial assessment;1st time breastfeeding Age:38 hours  P2, Mother states she wants to pump and bottle feed. Lactation to follow up on MBU.   Consult Status Consult Status: Follow-up from L&D    Hardie Pulley  RN IBCLC 06/22/2021, 9:57 AM

## 2021-06-22 NOTE — Lactation Note (Addendum)
This note was copied from a baby's chart. Lactation Consultation Note  Patient Name: Tammy Mcgrath [id number] Date: 06/22/2021 Reason for consult: Initial assessment;1st time breastfeeding;Early term 37-38.6wks;Breast reduction Age:38 hours  LC in to visit with P2 Mom of ET infant.  FOB doing STS with baby currently.  Baby fed 6 ml of donor breast milk as Mom is not feeling well.  Mom states she is feeling a bit stronger, but not enough to do STS yet.    Mom states she would like to breastfeed for baby to obtain her colostrum, but eventually plans to pump and bottle feed.  Mom also had  BREAST REDUCTION surgery.  Both areolas have been resized, but nipples were not removed.  FOB stated that doctor said that Mom would be able to still breastfeed.  Talked about breast reduction surgery and its affect on milk supply.  Recommended double pumping after breastfeeding as soon as possible to stimulate milk supply.  Mom will let her RN or LC know when she is ready to start pumping.  Reviewed breast massage and hand expression.  Unable to express any colostrum currently.  FOB wanted baby to go STS on Mom, to stimulate baby to breastfeed, but Mom is not feeling well yet.    Lactation brochure provided.  Mom to call prn for assistance.  Maternal Data Has patient been taught Hand Expression?: Yes Does the patient have breastfeeding experience prior to this delivery?: No Interventions Interventions: Breast feeding basics reviewed;Skin to skin;Breast massage;Hand express;LC Services brochure  Discharge WIC Program: No  Consult Status Consult Status: Follow-up Date: 06/23/21 Follow-up type: In-patient    Tammy Mcgrath 06/22/2021, 1:27 PM

## 2021-06-23 LAB — CBC
HCT: 26.9 % — ABNORMAL LOW (ref 36.0–46.0)
Hemoglobin: 8.9 g/dL — ABNORMAL LOW (ref 12.0–15.0)
MCH: 26.4 pg (ref 26.0–34.0)
MCHC: 33.1 g/dL (ref 30.0–36.0)
MCV: 79.8 fL — ABNORMAL LOW (ref 80.0–100.0)
Platelets: 210 10*3/uL (ref 150–400)
RBC: 3.37 MIL/uL — ABNORMAL LOW (ref 3.87–5.11)
RDW: 14.6 % (ref 11.5–15.5)
WBC: 8.9 10*3/uL (ref 4.0–10.5)
nRBC: 0.2 % (ref 0.0–0.2)

## 2021-06-23 MED ORDER — FERROUS SULFATE 325 (65 FE) MG PO TABS
325.0000 mg | ORAL_TABLET | ORAL | Status: DC
  Administered 2021-06-23: 325 mg via ORAL
  Filled 2021-06-23: qty 1

## 2021-06-23 NOTE — Progress Notes (Addendum)
Patient ID: Tammy Mcgrath, female   DOB: [birth year], 40 y.o.   MRN: [id number]  POSTPARTUM PROGRESS NOTE  Post Partum Day 1  Subjective:  Tammy Mcgrath is a 40 y.o. V8L3810 s/p vaginal delivery at [redacted]w[redacted]d.  No acute events overnight.  Pt denies problems with ambulating, voiding or po intake.  She denies nausea or vomiting.  Pain is moderately controlled.  Reports mild generalized abdominal cramping. She has had flatus. She has not had bowel movement.  She reports chills since delivery, denies fever. Pt states she has a headache and has not tried medication for it. Pt's blood pressures have normalized from last night's elevation. Pt reports she has been needing to change her pad every 2-3 hours due to heavy vaginal bleeding.   Objective: Blood pressure 116/88, pulse 90, temperature 98.2 F (36.8 C), temperature source Oral, resp. rate 18, height 5\' 9"  (1.753 m), weight 90.4 kg, last menstrual period 09/09/2020, SpO2 99 %, unknown if currently breastfeeding.  Physical Exam:  General: alert, cooperative and no distress Chest: no respiratory distress Heart:regular rate, distal pulses intact Abdomen: soft, nontender,  Uterine Fundus: firm, appropriately tender DVT Evaluation: No calf swelling or tenderness Extremities: no edema Skin: warm, dry  Recent Labs    06/21/21 1114 06/23/21 0605  HGB 10.5* 8.9*  HCT 32.6* 26.9*    Assessment/Plan: Tammy Mcgrath is a 40 y.o. 20 s/p vaginal delivery at [redacted]w[redacted]d   PPD#1 - Doing well Hypertension  - Continue procardia 60mg  Headache   - BP WNL this AM. Will trial ibuprofen for pain Anemia  - Hgb decreased to 8.1 from 10.5  - Ordered oral iron   Contraception: progestin-only pills Feeding: breastfeeding    LOS: 2 days   [redacted]w[redacted]d, Student-PA, CNM 06/23/2021, 7:53 AM    Attestation of Supervision of Student:  I confirm that I have verified the information documented in the  resident  student's note and that I have  also personally reperformed the history, physical exam and all medical decision making activities.  I have verified that all services and findings are accurately documented in this student's note; and I agree with management and plan as outlined in the documentation. I have also made any necessary editorial changes.  09-01-1992, CNM [facility] 13/05/2021, [facility] 06/23/2021 10:19 AM

## 2021-06-23 NOTE — Anesthesia Postprocedure Evaluation (Signed)
Anesthesia Post Note  Patient: Tammy Mcgrath  Procedure(s) Performed: AN AD HOC LABOR EPIDURAL     Patient location during evaluation: Mother Baby Anesthesia Type: Epidural Level of consciousness: awake, oriented and awake and alert Pain management: pain level controlled Vital Signs Assessment: post-procedure vital signs reviewed and stable Respiratory status: spontaneous breathing, respiratory function stable and nonlabored ventilation Cardiovascular status: stable Postop Assessment: adequate PO intake, able to ambulate, patient able to bend at knees, no headache and no apparent nausea or vomiting Anesthetic complications: no   No notable events documented.  Last Vitals:  Vitals:   06/23/21 0040 06/23/21 0535  BP: (!) 132/93 116/88  Pulse: (!) 102 90  Resp: 18 18  Temp: 36.7 C 36.8 C  SpO2: 100% 99%    Last Pain:  Vitals:   06/23/21 0535  TempSrc: Oral  PainSc: 0-No pain   Pain Goal: Patients Stated Pain Goal: 3 (06/22/21 1400)                 Barkley Kratochvil

## 2021-06-23 NOTE — Lactation Note (Addendum)
This note was copied from a baby's chart. Lactation Consultation Note  Patient Name: Tammy Mcgrath [id number] Date: 06/23/2021 Reason for consult: Follow-up assessment;Difficult latch;Early term 37-38.6wks;Breast reduction Age:38 hours  LC in to visit with P2 Mom of ET infant.  Baby is at 2% weight loss, output 1 void and 1 stool.    Mom reports baby wanting to breastfeed baby all night.  Mom states baby would latch onto nipple and suck a few times and then shake her head and then try to latch again.  Mom tried to supplement baby with a bottle, but baby would continue turning to latch to the breast.   Mom has pumped twice.  Unable to express any colostrum.  Mom shared with LC that FOB really wants baby to breastfeed.  Mom understands that due to her reduction surgery, baby isn't being satisfied at the breast.  Talked to Mom about not knowing until day 5 or 6 and she hasn't experienced full milk volume.  Encouraged STS with baby, offering breast before AND after supplementing.  Talked about the critical importance of consistent pumping to stimulate the milk supply.  Mom states she will have to start pumping more.   Mom informed of pump rental available in gift shop.  Baby started to stir while swaddled in crib.  LC offered to change her diaper and assist with positioning and latching to breast.  Mom wants to wait as she wants baby's pictures done first.   Mom to call prn for assistance with latching to the breast.    Lactation Tools Discussed/Used Tools: Pump;Flanges Flange Size: 27 Breast pump type: Double-Electric Breast Pump Pump Education: Setup, frequency, and cleaning Reason for Pumping: support milk supply/breast reduction surgery Pumping frequency: twice since birth, encouraged Mom to pump more often Pumped volume: 0 mL  Interventions Interventions: Breast feeding basics reviewed;Skin to skin;Breast massage;Hand express;DEBP  Discharge Pump: Refer for rental  Consult  Status Consult Status: Follow-up Date: 06/24/21 Follow-up type: In-patient    Tammy Mcgrath 06/23/2021, 12:21 PM

## 2021-06-24 MED ORDER — IBUPROFEN 600 MG PO TABS: 600.0000 mg | ORAL_TABLET | Freq: Four times a day (QID) | ORAL | 0 refills | Status: DC

## 2021-06-24 NOTE — Social Work (Signed)
CSW received consult for Edinburgh 11. CSW met with MOB to offer support and complete assessment.     CSW met with MOB bedside and introduced CSW role. CSW observed MOB sitting on the bed, clothed, and holding the infant. MOB presented with a smile and appeared happy. CSW inquired how MOB has felt since giving birth. CSW reported feeling good and relieved. CSW discussed MOB Edinburgh and inquired how she felt during the pregnancy. MOB reported she struggled emotionally the first few months of the pregnancy because of work related stress and concerned about the infant's health. MOB shared work stress triggered her to feeling worried and panicky. MOB shared she saw therapist a few times through the EAP program at her job which she felt was very helpful in managing her stress. 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 MOB reported she often relies on her spouse who she identifies as her greatest support and her family. CSW inquired if MOB has history of mental health. MOB reported no history of mental health but is aware that depression is listed as a diagnosis in her chart. MOB shared she is not depressed she feels sad at times but does not feel it is an issue. CSW inquired if MOB experienced PPD. MOB reported she did not experience PPD after the birth of her older child. CSW provided education regarding the baby blues period vs. perinatal mood disorders, discussed treatment and gave resources for mental health follow up if concerns arise.  CSW recommended MOB complete a self-evaluation during the postpartum time period using the New Mom Checklist from Postpartum Progress and encouraged MOB to contact a medical professional if symptoms are noted at any time.  MOB reported she feels comfortable reaching out to a medical professional if concerns arise. MOB shared she will also reach out to a therapist again, if needed. MOB denies SI/HI/DV.   CSW  provided review of Sudden Infant Death Syndrome (SIDS) precautions. MOB reported she has essential items for the infant including a bassinet and crib where the infant will sleep. MOB has chosen [facility] for infant's follow up care. MOB reported she applied for WIC however she is not sure if she will qualify. CSW assessed MOB for additional needs. MOB reported no further needs.    CSW identifies no further need for intervention and no barriers to discharge at this time.   Tammy Mcgrath, MSW, LCSW [facility]  Clinical Social Worker  [phone] 06/24/2021  2:10 PM  

## 2021-06-24 NOTE — Lactation Note (Signed)
This note was copied from a baby's chart. Lactation Consultation Note  Patient Name: Girl Afua Hoots [id number] Date: 06/24/2021 Reason for consult: Follow-up assessment;Primapara;1st time breastfeeding;Breast reduction;Early term 37-38.6wks;Infant weight loss;Other (Comment) (Baby asleep in the crib . per mom  has pumped x 2 in the last 24 hours/ no results / and has latched a few times. per mom baby will latch/ but does not sustain latch for very long. plans to pump and supplement due to her reduction.) Age:38 hours LC reviewed Breast feeding goals - feed with feeding cues and by 3 hours.  Option #1 If latching - supplement afterwards at least 30 ml or more if baby still hungry.  Option #2 if not latching and just pumping at least 8 times a day both breast for 15 mins ( could be 8-10 times in 24 hours) .  Option #3 - if mom decided to only formula feed - how to dry up her milk.  Mom aware of the [facility] resources after D/C for BF.   Maternal Data    Feeding Mother's Current Feeding Choice: Breast Milk and Donor Milk Nipple Type: Slow - flow  LATCH Score                    Lactation Tools Discussed/Used Tools: Pump;Flanges Flange Size: 27 Breast pump type: Double-Electric Breast Pump Pumping frequency: pe rmom 2 times in the last 24 Pumped volume: 0 mL  Interventions Interventions: Breast feeding basics reviewed;Education;DEBP;LC Services brochure  Discharge Discharge Education: Outpatient recommendation;Other (comment) (Check with [facility] or  for F/U if latching the baby . mom has the [facility] brochure) Pump: Personal;DEBP  Consult Status Consult Status: Complete Date: 06/24/21    Kathrin Greathouse 06/24/2021, 12:11 PM

## 2021-06-28 ENCOUNTER — Inpatient Hospital Stay (HOSPITAL_COMMUNITY): Payer: 59

## 2021-06-29 ENCOUNTER — Encounter: Payer: 59 | Admitting: Family Medicine

## 2021-06-30 ENCOUNTER — Other Ambulatory Visit: Payer: Self-pay

## 2021-06-30 ENCOUNTER — Encounter: Payer: Self-pay | Admitting: General Practice

## 2021-06-30 ENCOUNTER — Ambulatory Visit (INDEPENDENT_AMBULATORY_CARE_PROVIDER_SITE_OTHER): Payer: 59 | Admitting: Family Medicine

## 2021-06-30 VITALS — BP 143/101 | HR 81 | Wt 182.0 lb

## 2021-06-30 DIAGNOSIS — O1003 Pre-existing essential hypertension complicating the puerperium: Secondary | ICD-10-CM | POA: Diagnosis not present

## 2021-06-30 DIAGNOSIS — O10919 Unspecified pre-existing hypertension complicating pregnancy, unspecified trimester: Secondary | ICD-10-CM

## 2021-06-30 NOTE — Progress Notes (Signed)
   Subjective:    Patient ID: Tammy Mcgrath, female    DOB: 29-Nov-1980, 40 y.o.   MRN: [id number]  HPI  Patient seen for nurse visit for BP check. She also complains of concerns that she broke a stitch of her laceration repair. She sat down hard and felt a sharp pain. Her husband looked at it and thought she had torn a stitch.  Review of Systems     Objective:  BP (!) 143/101   Pulse 81   Wt 182 lb (82.6 kg)   LMP 09/09/2020   Breastfeeding Yes   BMI 26.88 kg/m    Physical Exam Vitals reviewed. Exam conducted with a chaperone present.  Genitourinary:       Assessment & Plan:  1. Chronic hypertension in pregnancy Did not take BP today  2. Second degree perineal laceration during delivery Appears intact.

## 2021-07-05 ENCOUNTER — Telehealth (HOSPITAL_COMMUNITY): Payer: Self-pay | Admitting: *Deleted

## 2021-07-05 NOTE — Telephone Encounter (Signed)
Patient voiced no questions or concerns regarding her own health. Patient requested not to complete EPDS. Stated, "I promise I'm fine. I'm just a little tired. I just don't want to answer those questions again." Patient asked when it was safe to do a tub bath for infant. RN instructed patient that a tub bath was fine when the umbilical cord has healed and fallen off. Patient voiced no other questions or concerns regarding baby at this time. Patient reported infant sleeps in someone's arms or in a bassinet on her back. RN reviewed ABCs of safe sleep - patient verbalized understanding. Patient requested RN email information on hospital's virtual postpartum classes and support groups. Email sent. Deforest Hoyles, RN 07/05/21, [phone]

## 2021-07-28 ENCOUNTER — Other Ambulatory Visit: Payer: Self-pay

## 2021-07-28 ENCOUNTER — Ambulatory Visit (INDEPENDENT_AMBULATORY_CARE_PROVIDER_SITE_OTHER): Payer: 59 | Admitting: Family Medicine

## 2021-07-28 ENCOUNTER — Encounter: Payer: Self-pay | Admitting: Family Medicine

## 2021-07-28 DIAGNOSIS — O1093 Unspecified pre-existing hypertension complicating the puerperium: Secondary | ICD-10-CM

## 2021-07-28 DIAGNOSIS — O10919 Unspecified pre-existing hypertension complicating pregnancy, unspecified trimester: Secondary | ICD-10-CM

## 2021-07-28 NOTE — Progress Notes (Signed)
Post Partum Visit Note  Tammy Mcgrath is a 40 y.o. G69P2012 female who presents for a postpartum visit. She is 5 weeks postpartum following a normal spontaneous vaginal delivery.  I have fully reviewed the prenatal and intrapartum course. The delivery was at 38 gestational weeks.  Anesthesia: epidural. Postpartum course has been normal. Baby is doing well. Baby is feeding by  Similac 360 . Bleeding staining only. Bowel function is normal. Bladder function is normal. Patient is not sexually active. Contraception method is none. Postpartum depression screening: negative.   The pregnancy intention screening data noted above was reviewed. Potential methods of contraception were discussed. The patient elected to proceed with No data recorded.   Edinburgh Postnatal Depression Scale - 07/28/21 1136       Edinburgh Postnatal Depression Scale:  In the Past 7 Days   I have been able to laugh and see the funny side of things. 0    I have looked forward with enjoyment to things. 0    I have blamed myself unnecessarily when things went wrong. 1    I have been anxious or worried for no good reason. 1    I have felt scared or panicky for no good reason. 1    Things have been getting on top of me. 0    I have been so unhappy that I have had difficulty sleeping. 1    I have felt sad or miserable. 1    I have been so unhappy that I have been crying. 0    The thought of harming myself has occurred to me. 0    Edinburgh Postnatal Depression Scale Total 5             Health Maintenance Due  Topic Date Due   COVID-19 Vaccine (1) Never done   Hepatitis C Screening  Never done   TETANUS/TDAP  Never done   PAP SMEAR-Modifier  Never done   INFLUENZA VACCINE  Never done    The following portions of the patient's history were reviewed and updated as appropriate: allergies, current medications, past family history, past medical history, past social history, past surgical history, and problem  list.  Review of Systems Pertinent items are noted in HPI.  Objective:  BP (!) 129/92    Pulse 89    Wt 175 lb (79.4 kg)    LMP 09/09/2020    Breastfeeding No    BMI 25.84 kg/m    General:  alert, cooperative, and no distress   Breasts:  not indicated  Lungs: clear to auscultation bilaterally  Heart:  regular rate and rhythm, S1, S2 normal, no murmur, click, rub or gallop  Abdomen: soft, non-tender; bowel sounds normal; no masses,  no organomegaly   Wound N/a  GU exam:  not indicated       Assessment:   1. Postpartum exam   2. Chronic hypertension in pregnancy       Plan:   Essential components of care per ACOG recommendations:  1.  Mood and well being: Patient with negative depression screening today. Reviewed local resources for support.  - Patient tobacco use? No.   - hx of drug use? No.    2. Infant care and feeding:  -Patient currently breastmilk feeding? Yes. Reviewed importance of draining breast regularly to support lactation.  -Social determinants of health (SDOH) reviewed in EPIC. No concerns  3. Sexuality, contraception and birth spacing - Patient does not want a pregnancy in the next year.   -  Reviewed forms of contraception in tiered fashion. Patient desired no method today. Considering IUD  - Discussed birth spacing of 18 months  4. Sleep and fatigue -Encouraged family/partner/community support of 4 hrs of uninterrupted sleep to help with mood and fatigue  5. Physical Recovery  - Discussed patients delivery and complications. She describes her labor as good. - Patient had a Vaginal, no problems at delivery. Patient had a 1st degree laceration. Perineal healing reviewed. Patient expressed understanding - Patient has urinary incontinence? No. - Patient is safe to resume physical and sexual activity  6.  Health Maintenance - HM due items addressed Yes - Last pap smear No results found for: DIAGPAP Pap smear not done at today's visit.  -Breast Cancer  screening indicated? No.   7. Chronic Disease/Pregnancy Condition follow up: Hypertension  - PCP follow up  Levie Heritage, DO [facility], [facility]

## 2022-01-19 ENCOUNTER — Encounter (HOSPITAL_COMMUNITY): Payer: Self-pay | Admitting: Emergency Medicine

## 2022-01-19 ENCOUNTER — Emergency Department (HOSPITAL_COMMUNITY): Payer: 59

## 2022-01-19 ENCOUNTER — Emergency Department (HOSPITAL_COMMUNITY)
Admission: EM | Admit: 2022-01-19 | Discharge: 2022-01-19 | Disposition: A | Discharge status: Home / Self Care | Payer: 59 | Attending: Emergency Medicine | Admitting: Emergency Medicine

## 2022-01-19 ENCOUNTER — Other Ambulatory Visit: Payer: Self-pay

## 2022-01-19 DIAGNOSIS — R202 Paresthesia of skin: Secondary | ICD-10-CM | POA: Insufficient documentation

## 2022-01-19 DIAGNOSIS — J029 Acute pharyngitis, unspecified: Secondary | ICD-10-CM | POA: Insufficient documentation

## 2022-01-19 DIAGNOSIS — R531 Weakness: Secondary | ICD-10-CM | POA: Insufficient documentation

## 2022-01-19 DIAGNOSIS — H53149 Visual discomfort, unspecified: Secondary | ICD-10-CM | POA: Diagnosis not present

## 2022-01-19 DIAGNOSIS — J3489 Other specified disorders of nose and nasal sinuses: Secondary | ICD-10-CM | POA: Diagnosis not present

## 2022-01-19 DIAGNOSIS — R519 Headache, unspecified: Secondary | ICD-10-CM | POA: Diagnosis present

## 2022-01-19 DIAGNOSIS — R0981 Nasal congestion: Secondary | ICD-10-CM | POA: Diagnosis not present

## 2022-01-19 DIAGNOSIS — R112 Nausea with vomiting, unspecified: Secondary | ICD-10-CM | POA: Diagnosis not present

## 2022-01-19 LAB — CBC WITH DIFFERENTIAL/PLATELET
Abs Immature Granulocytes: 0.01 10*3/uL (ref 0.00–0.07)
Basophils Absolute: 0.1 10*3/uL (ref 0.0–0.1)
Basophils Relative: 1 %
Eosinophils Absolute: 0.2 10*3/uL (ref 0.0–0.5)
Eosinophils Relative: 6 %
HCT: 38.7 % (ref 36.0–46.0)
Hemoglobin: 13.1 g/dL (ref 12.0–15.0)
Immature Granulocytes: 0 %
Lymphocytes Relative: 37 %
Lymphs Abs: 1.3 10*3/uL (ref 0.7–4.0)
MCH: 27.9 pg (ref 26.0–34.0)
MCHC: 33.9 g/dL (ref 30.0–36.0)
MCV: 82.5 fL (ref 80.0–100.0)
Monocytes Absolute: 0.3 10*3/uL (ref 0.1–1.0)
Monocytes Relative: 10 %
Neutro Abs: 1.6 10*3/uL — ABNORMAL LOW (ref 1.7–7.7)
Neutrophils Relative %: 46 %
Platelets: 245 10*3/uL (ref 150–400)
RBC: 4.69 MIL/uL (ref 3.87–5.11)
RDW: 14.8 % (ref 11.5–15.5)
WBC: 3.5 10*3/uL — ABNORMAL LOW (ref 4.0–10.5)
nRBC: 0 % (ref 0.0–0.2)

## 2022-01-19 LAB — I-STAT BETA HCG BLOOD, ED (MC, WL, AP ONLY): I-stat hCG, quantitative: <5 m[IU]/mL (ref <5)

## 2022-01-19 LAB — BASIC METABOLIC PANEL
Anion gap: 7 (ref 5–15)
BUN: 7 mg/dL (ref 6–20)
CO2: 28 mmol/L (ref 22–32)
Calcium: 8.7 mg/dL — ABNORMAL LOW (ref 8.9–10.3)
Chloride: 104 mmol/L (ref 98–111)
Creatinine, Ser: 0.7 mg/dL (ref 0.44–1.00)
GFR, Estimated: >60 mL/min (ref >60)
Glucose, Bld: 94 mg/dL (ref 70–99)
Potassium: 3.7 mmol/L (ref 3.5–5.1)
Sodium: 139 mmol/L (ref 135–145)

## 2022-01-19 MED ORDER — ONDANSETRON HCL 4 MG/2ML IJ SOLN
4.0000 mg | Freq: Once | INTRAMUSCULAR | Status: AC
Start: 2022-01-19 — End: 2022-01-19
  Administered 2022-01-19: 4 mg via INTRAVENOUS
  Filled 2022-01-19: qty 2

## 2022-01-19 MED ORDER — LACTATED RINGERS IV BOLUS
1000.0000 mL | Freq: Once | INTRAVENOUS | Status: AC
  Administered 2022-01-19: 1000 mL via INTRAVENOUS

## 2022-01-19 MED ORDER — ONDANSETRON 4 MG PO TBDP
4.0000 mg | ORAL_TABLET | Freq: Once | ORAL | Status: AC | PRN
  Administered 2022-01-19: 4 mg via ORAL
  Filled 2022-01-19: qty 1

## 2022-01-19 MED ORDER — KETOROLAC TROMETHAMINE 30 MG/ML IJ SOLN
30.0000 mg | Freq: Once | INTRAMUSCULAR | Status: AC
  Administered 2022-01-19: 30 mg via INTRAVENOUS
  Filled 2022-01-19: qty 1

## 2022-01-19 MED ORDER — DIPHENHYDRAMINE HCL 50 MG/ML IJ SOLN
25.0000 mg | Freq: Once | INTRAMUSCULAR | Status: AC
  Administered 2022-01-19: 25 mg via INTRAVENOUS
  Filled 2022-01-19: qty 1

## 2022-01-19 MED ORDER — IOHEXOL 300 MG/ML  SOLN
100.0000 mL | Freq: Once | INTRAMUSCULAR | Status: AC | PRN
  Administered 2022-01-19: 100 mL via INTRAVENOUS

## 2022-01-19 MED ORDER — METOCLOPRAMIDE HCL 5 MG/ML IJ SOLN
10.0000 mg | Freq: Once | INTRAMUSCULAR | Status: AC
  Administered 2022-01-19: 10 mg via INTRAVENOUS
  Filled 2022-01-19: qty 2

## 2022-01-19 NOTE — ED Provider Notes (Signed)
[facility] EMERGENCY DEPARTMENT Provider Note   CSN: [id number] Arrival date & time: 01/19/22  0805     History  Chief Complaint  Patient presents with   Headache    Tammy Mcgrath is a 41 y.o. female.  Patient with a history of "migraine headaches" presenting with a diffuse frontal headache progressively worsening over the past 3 days.  It waxes and wanes in severity and improved somewhat with Tylenol.  Does feel similar to her migraines and is associated with nausea, dry heaving, photosensitivity and body aches.  She has also had cold symptoms for the past several days with cough and runny nose and sore throat.  States her blood pressure has been elevated at home to 180 systolic despite taking her prescribed nifedipine.  She denies any focal weakness, numbness or tingling.  Denies any difficulty speaking or difficulty swallowing.  Denies any fevers or vomiting but has had nausea and dry heaving. She is 6 months postpartum and not breast-feeding.  States she has had blood pressure issues even prior to her pregnancy. Does have hearing deficit and tinnitus at baseline which are unchanged though tinnitus is slightly worse today.  The history is provided by the patient.  Headache Associated symptoms: congestion, nausea, photophobia, sore throat, vomiting and weakness   Associated symptoms: no abdominal pain, no cough, no fatigue, no fever and no myalgias        Home Medications Prior to Admission medications   Medication Sig Start Date End Date Taking? Authorizing Provider  cefadroxil (DURICEF) 500 MG capsule Take 1 capsule (500 mg total) by mouth 2 (two) times daily. Patient not taking: Reported on 06/30/2021 06/20/21   Levie Heritage, DO  ibuprofen (ADVIL) 600 MG tablet Take 1 tablet (600 mg total) by mouth every 6 (six) hours. Patient not taking: Reported on 06/30/2021 06/24/21   Cresenzo-Dishmon, Scarlette Calico, CNM  NIFEdipine (PROCARDIA-XL/NIFEDICAL-XL) 30 MG 24  hr tablet Take 60 mg by mouth daily.    [provider]  Prenatal Vit-Fe Fumarate-FA (MULTIVITAMIN-PRENATAL) 27-0.8 MG TABS tablet Take 1 tablet by mouth daily at 12 noon.    [provider]      Allergies    Patient has no known allergies.    Review of Systems   Review of Systems  Constitutional:  Negative for activity change, appetite change, fatigue and fever.  HENT:  Positive for congestion, rhinorrhea and sore throat.   Eyes:  Positive for photophobia.  Respiratory:  Negative for cough and shortness of breath.   Cardiovascular:  Negative for chest pain.  Gastrointestinal:  Positive for nausea and vomiting. Negative for abdominal pain.  Genitourinary:  Negative for dysuria, vaginal bleeding and vaginal discharge.  Musculoskeletal:  Negative for arthralgias and myalgias.  Skin:  Negative for rash.  Neurological:  Positive for weakness and headaches.   all other systems are negative except as noted in the HPI and PMH.    Physical Exam Updated Vital Signs BP (!) 120/104 (BP Location: Left Arm)   Pulse 94   Temp 98.5 F (36.9 C) (Oral)   Resp 16   SpO2 100%  Physical Exam Vitals and nursing note reviewed.  Constitutional:      General: She is not in acute distress.    Appearance: Normal appearance. She is well-developed and normal weight. She is not ill-appearing.  HENT:     Head: Normocephalic and atraumatic.     Nose: Congestion and rhinorrhea present.     Mouth/Throat:  Pharynx: No oropharyngeal exudate.  Eyes:     Conjunctiva/sclera: Conjunctivae normal.     Pupils: Pupils are equal, round, and reactive to light.  Neck:     Comments: No meningismus. Cardiovascular:     Rate and Rhythm: Normal rate and regular rhythm.     Heart sounds: Normal heart sounds. No murmur heard. Pulmonary:     Effort: Pulmonary effort is normal. No respiratory distress.     Breath sounds: Normal breath sounds.  Abdominal:     Palpations: Abdomen is soft.      Tenderness: There is no abdominal tenderness. There is no guarding or rebound.  Musculoskeletal:        General: No tenderness. Normal range of motion.     Cervical back: Normal range of motion and neck supple.  Skin:    General: Skin is warm.     Capillary Refill: Capillary refill takes less than 2 seconds.     Findings: No rash.  Neurological:     General: No focal deficit present.     Mental Status: She is alert and oriented to person, place, and time. Mental status is at baseline.     Cranial Nerves: No cranial nerve deficit.     Motor: No abnormal muscle tone.     Coordination: Coordination normal.     Comments:  5/5 strength throughout. CN 2-12 intact.Equal grip strength.   Psychiatric:        Behavior: Behavior normal.     ED Results / Procedures / Treatments   Labs (all labs ordered are listed, but only abnormal results are displayed) Labs Reviewed  CBC WITH DIFFERENTIAL/PLATELET - Abnormal; Notable for the following components:      Result Value   WBC 3.5 (*)    Neutro Abs 1.6 (*)    All other components within normal limits  BASIC METABOLIC PANEL - Abnormal; Notable for the following components:   Calcium 8.7 (*)    All other components within normal limits  SARS CORONAVIRUS 2 BY RT PCR  I-STAT BETA HCG BLOOD, ED ([facility], [facility], [facility] ONLY)    EKG None  Radiology CT VENOGRAM HEAD  Result Date: 01/19/2022 CLINICAL DATA:  Dural venous sinus thrombosis suspected EXAM: CT VENOGRAM HEAD TECHNIQUE: Venographic phase images of the brain were obtained following the administration of intravenous contrast. Multiplanar reformats and maximum intensity projections were generated. RADIATION DOSE REDUCTION: This exam was performed according to the departmental dose-optimization program which includes automated exposure control, adjustment of the mA and/or kV according to patient size and/or use of iterative reconstruction technique. CONTRAST:  OMNIPAQUE IOHEXOL 300 MG/ML  SOLN  COMPARISON:  CT head 04/27/2010. FINDINGS: No evidence of dural venous sinus thrombosis. The superior sagittal sinus, straight, transverse, and sigmoid sinuses are patent. The right transverse and sigmoid sinuses are small/hypoplastic. No evidence of acute large vascular territory infarct, acute hemorrhage, midline shift, hydrocephalus, or visible extra-axial fluid collection. IMPRESSION: 1. No evidence of dural venous sinus thrombosis. 2. Partially empty sella and small/hypoplastic right transverse/sigmoid sinuses. While nonspecific, findings can be seen in the setting of idiopathic intracranial hypertension. Electronically Signed   By: Feliberto Harts M.D.   On: 01/19/2022 12:56    Procedures Procedures    Medications Ordered in ED Medications  lactated ringers bolus 1,000 mL (has no administration in time range)  metoCLOPramide (REGLAN) injection 10 mg (has no administration in time range)  diphenhydrAMINE (BENADRYL) injection 25 mg (has no administration in time range)  ketorolac (TORADOL) 30  MG/ML injection 30 mg (has no administration in time range)  ondansetron (ZOFRAN) injection 4 mg (has no administration in time range)  ondansetron (ZOFRAN-ODT) disintegrating tablet 4 mg (4 mg Oral Given 01/19/22 0820)    ED Course/ Medical Decision Making/ A&P                           Medical Decision Making Amount and/or Complexity of Data Reviewed Independent Historian: spouse Labs: ordered. Decision-making details documented in ED Course. Radiology: ordered and independent interpretation performed. Decision-making details documented in ED Course. ECG/medicine tests: ordered and independent interpretation performed. Decision-making details documented in ED Course.  Risk Prescription drug management.  3 days of URI symptoms with gradual onset diffuse headache consistent with "migraines".  No thunderclap onset.  No focal neurological deficits.  Low suspicion for meningitis, temporal  arteritis, subarachnoid hemorrhage, dural sinus thrombosis.  Given patient's severe headache with no previous imaging in system, CT scan was pursued to rule out structural lesion of the brain.  Results reviewed and interpreted by me.  No evidence of hemorrhage No evidence of dural sinus thrombosis. She has no visual complaints to suggest idiopathic intracranial hypertension currently. No appreciable papilledema.   Patient states headache has resolved with migraine cocktail including Toradol, Benadryl and Reglan.  Blood pressure is normal.  No fever.  Low suspicion for subarachnoid hemorrhage, meningitis, temporal arteritis.  COVID test is pending.  Patient tolerating p.o. and ambulatory reports resolution of her headache.  Discussed follow-up with neurology and keeping headache diary at home to document the frequency of her headaches.  She was at the return to the ED with atypical headache, headache associated with visual changes, unilateral weakness, numbness, tingling, difficulty speaking, difficulty swallowing, sudden onset headache or any other concerns        Final Clinical Impression(s) / ED Diagnoses Final diagnoses:  Bad headache    Rx / DC Orders ED Discharge Orders     None         Derya Dettmann, Jeannett SeniorStephen, MD 01/19/22 1318

## 2022-01-19 NOTE — ED Triage Notes (Signed)
Pt states she had a cold over the weekend. Then started having migraines. (Pt has hx of migraines). Pt states her migraines are getting worse and her congestion is still present. Her BP was elevated yesterday as well.Pt states she is now feeling nauseous with her migraine. Pt also states she has hearing loss and tinnitus (at baseline) but the tinnitus is worse today.

## 2022-01-19 NOTE — Discharge Instructions (Addendum)
As we discussed, your imaging of your head is reassuring.  Your CT scan does show some nonspecific findings that suggest possibility of intracranial hypertension but we do not favor this at this time.  You should follow-up with a neurologist for further evaluation of your headaches.  Return to the ED with atypical headache, headache with visual changes, unilateral weakness, numbness, tingling, difficulty speaking, difficulty swallowing, fever, other concerns.

## 2022-01-20 ENCOUNTER — Encounter: Payer: Self-pay | Admitting: Family Medicine

## 2022-01-20 ENCOUNTER — Ambulatory Visit: Payer: 59 | Admitting: Family Medicine

## 2022-01-20 VITALS — BP 138/78 | HR 75 | Ht 69.0 in | Wt 169.8 lb

## 2022-01-20 DIAGNOSIS — R7301 Impaired fasting glucose: Secondary | ICD-10-CM | POA: Diagnosis not present

## 2022-01-20 DIAGNOSIS — I10 Essential (primary) hypertension: Secondary | ICD-10-CM

## 2022-01-20 DIAGNOSIS — K921 Melena: Secondary | ICD-10-CM

## 2022-01-20 DIAGNOSIS — E559 Vitamin D deficiency, unspecified: Secondary | ICD-10-CM | POA: Diagnosis not present

## 2022-01-20 DIAGNOSIS — Z0001 Encounter for general adult medical examination with abnormal findings: Secondary | ICD-10-CM

## 2022-01-20 DIAGNOSIS — Z1159 Encounter for screening for other viral diseases: Secondary | ICD-10-CM | POA: Diagnosis not present

## 2022-01-20 NOTE — Progress Notes (Unsigned)
New Patient Office Visit  Subjective:  Patient ID: Tammy Mcgrath, female    DOB: 1981/02/25  Age: 41 y.o. MRN: [id number]  CC:  Chief Complaint  Patient presents with   New Patient (Initial Visit)    Pt establishing care. Pt c/o migraines and high bp. Due for pap smear.     HPI Tammy Mcgrath is a 41 y.o. female with past medical history of hypertension and migraines headaches presents for establishing care. 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 -she reports symptoms of palpitations, chest tightness, and server migraine headaches before going to work -she takes Procardia 74m daily -she will be following up with neurology concerning her migraine headaches    Past Medical History:  Diagnosis Date   Chronic hypertension    Depression    not a formal dx, "is sad, not suicidal"   Eczema    Headache    Infection    UTI   Migraines     Past Surgical History:  Procedure Laterality Date   BREAST SURGERY     reduction   DILATION AND CURETTAGE OF UTERUS      Family History  Problem Relation Age of Onset   Hypertension Father    Stroke Father     Social History   Socioeconomic History   Marital status: Single    Spouse name: Not on file   Number of children: Not on file   Years of education: Not on file   Highest education level: Not on file  Occupational History   Not on file  Tobacco Use   Smoking status: Never   Smokeless tobacco: Never  Vaping Use   Vaping Use: Never used  Substance and Sexual Activity   Alcohol use: Not Currently   Drug use: Not Currently    Types: Marijuana    Comment: last was ~3 months ago   Sexual activity: Yes  Other Topics Concern   Not on file  Social History Narrative   Not on file   Social Determinants of  Health   Financial Resource Strain: Not on file  Food Insecurity: Not on file  Transportation Needs: Not on file  Physical Activity: Not on file  Stress: Not on file  Social Connections: Not on file  Intimate Partner Violence: Not on file    ROS Review of Systems  Constitutional:  Negative for chills, fatigue and fever.  HENT:  Negative for congestion, postnasal drip, rhinorrhea, sinus pressure, sinus pain, sneezing and sore throat.   Eyes:  Negative for pain and redness.  Respiratory:  Negative for cough.   Cardiovascular:  Negative for chest pain and palpitations.  Gastrointestinal:  Positive for blood in stool and constipation. Negative for abdominal pain, diarrhea and vomiting.  Endocrine: Negative for polydipsia, polyphagia and polyuria.  Genitourinary:  Negative for dysuria, frequency and urgency.  Musculoskeletal:  Negative for back pain and neck pain.  Skin:  Negative for rash and wound.  Neurological:  Positive for headaches.  Psychiatric/Behavioral:  Positive for sleep disturbance. Negative for confusion, self-injury and suicidal ideas.     Objective:   Today's Vitals: BP 138/78   Pulse 75   Ht 5' 9"  (1.753 m)   Wt 169 lb 12.8 oz (77 kg)   SpO2 95%   Breastfeeding  No   BMI 25.08 kg/m   Physical Exam HENT:     Head: Normocephalic.     Right Ear: External ear normal.     Left Ear: External ear normal.     Nose: No congestion or rhinorrhea.     Mouth/Throat:     Mouth: Mucous membranes are moist.  Eyes:     Extraocular Movements: Extraocular movements intact.     Pupils: Pupils are equal, round, and reactive to light.  Cardiovascular:     Rate and Rhythm: Normal rate and regular rhythm.     Pulses: Normal pulses.  Pulmonary:     Effort: Pulmonary effort is normal.     Breath sounds: Normal breath sounds.  Abdominal:     Palpations: Abdomen is soft.  Musculoskeletal:     Cervical back: No rigidity.     Right lower leg: No edema.     Left lower leg: No  edema.  Skin:    General: Skin is warm.  Neurological:     Mental Status: She is alert and oriented to person, place, and time.  Psychiatric:        Behavior: Behavior normal.     Comments: Normal affect     Assessment & Plan:   Problem List Items Addressed This Visit       Cardiovascular and Mediastinum   Essential hypertension    -patient reports BP is lower today because she hasn't been working at her for a few days -encouraged pt to check ambulatory BP at home ad bring readings at her neck visit - will make adjustments to BP medications as indicated at her next visit. -reviewed DASH diet and increasing physical activities      Other Visit Diagnoses     Need for hepatitis C screening test    -  Primary   Relevant Orders   Hepatitis C Antibody   Vitamin D deficiency       Relevant Orders   Vitamin D (25 hydroxy)   IFG (impaired fasting glucose)       Relevant Orders   Hemoglobin A1C   Hematochezia       Relevant Orders   Ambulatory referral to Gastroenterology   Encounter for general adult medical examination with abnormal findings       Relevant Orders   CBC with Differential   CMP14+EGFR   TSH + free T4   Lipid panel       Outpatient Encounter Medications as of 01/20/2022  Medication Sig   acetaminophen (TYLENOL) 500 MG tablet Take 1,000 mg by mouth every 6 (six) hours as needed for mild pain.   Biotin w/ Vitamins C & E (HAIR/SKIN/NAILS PO) Take 1 capsule by mouth daily.   NIFEdipine (PROCARDIA-XL/NIFEDICAL-XL) 30 MG 24 hr tablet Take 60 mg by mouth daily.   No facility-administered encounter medications on file as of 01/20/2022.    Follow-up: No follow-ups on file.   Alvira Monday, FNP

## 2022-01-20 NOTE — Patient Instructions (Addendum)
I appreciate the opportunity to provide care to you today!    Follow up:  1 months  Labs: please stop by the lab anytime during the week to get your blood drawn (CBC, CMP, TSH, Lipid profile, HgA1c, Vit D)  - Please check your blood pressure at home daily and bring the reading at your next visit  BP goal - < 130/80  Screening: Hep C  Referrals today- gastroenterologist   Please continue to a heart-healthy diet and increase your physical activities. Try to exercise for 11mins at least three times a week.      It was a pleasure to see you and I look forward to continuing to work together on your health and well-being. Please do not hesitate to call the office if you need care or have questions about your care.   Have a wonderful day and week. With Gratitude, Alvira Monday MSN, FNP-BC

## 2022-01-22 DIAGNOSIS — I10 Essential (primary) hypertension: Secondary | ICD-10-CM | POA: Insufficient documentation

## 2022-01-22 NOTE — Assessment & Plan Note (Signed)
-  patient reports BP is lower today because she hasn't been working at her for a few days -encouraged pt to check ambulatory BP at home ad bring readings at her neck visit - will make adjustments to BP medications as indicated at her next visit. -reviewed DASH diet and increasing physical activities

## 2022-01-24 ENCOUNTER — Encounter: Payer: Self-pay | Admitting: Family Medicine

## 2022-01-26 ENCOUNTER — Other Ambulatory Visit: Payer: Self-pay | Admitting: Family Medicine

## 2022-01-26 MED ORDER — TELMISARTAN 40 MG PO TABS: 40.0000 mg | ORAL_TABLET | Freq: Every day | ORAL | 1 refills | Status: DC

## 2022-01-26 NOTE — Telephone Encounter (Signed)
I've spoke with the patient

## 2022-01-27 ENCOUNTER — Other Ambulatory Visit: Payer: Self-pay | Admitting: Family Medicine

## 2022-01-27 NOTE — Telephone Encounter (Signed)
Please inform the patient to start taking temisartan 40 mg daily. She can stop the Procardia. The prescription is sent to her pharmacy. Advise her to watch her salt intake, exercise and implement relaxation techniques to help reduce her stress.

## 2022-01-30 NOTE — Telephone Encounter (Signed)
Inform the patient to schedule an office visit and bring her forms

## 2022-01-31 ENCOUNTER — Encounter (INDEPENDENT_AMBULATORY_CARE_PROVIDER_SITE_OTHER): Payer: Self-pay | Admitting: *Deleted

## 2022-02-01 ENCOUNTER — Telehealth: Payer: Self-pay | Admitting: Family Medicine

## 2022-02-01 NOTE — Telephone Encounter (Signed)
Fmla   Noted  Copied  Sleeved

## 2022-02-03 ENCOUNTER — Encounter: Payer: Self-pay | Admitting: Family Medicine

## 2022-02-03 ENCOUNTER — Ambulatory Visit: Payer: 59 | Admitting: Family Medicine

## 2022-02-03 VITALS — BP 168/115 | HR 80 | Ht 69.0 in | Wt 173.8 lb

## 2022-02-03 DIAGNOSIS — I10 Essential (primary) hypertension: Secondary | ICD-10-CM | POA: Diagnosis not present

## 2022-02-06 NOTE — Telephone Encounter (Signed)
Patient picked up forms.

## 2022-02-07 NOTE — Telephone Encounter (Signed)
Patient called forms were incomplete will bring back to be completed.

## 2022-02-08 NOTE — Telephone Encounter (Signed)
New forms received .  Original placed in providers box.  Copy placed in brown folder up front.

## 2022-02-09 NOTE — Telephone Encounter (Signed)
Noted, will forward to provider to make her aware.

## 2022-02-09 NOTE — Telephone Encounter (Signed)
Forms ready for pick up patient informed

## 2022-02-20 ENCOUNTER — Other Ambulatory Visit: Payer: Self-pay | Admitting: Family Medicine

## 2022-02-20 ENCOUNTER — Ambulatory Visit: Payer: 59 | Admitting: Family Medicine

## 2022-02-20 ENCOUNTER — Encounter: Payer: Self-pay | Admitting: Family Medicine

## 2022-02-20 VITALS — BP 135/93 | HR 59 | Ht 69.0 in | Wt 171.0 lb

## 2022-02-20 DIAGNOSIS — I10 Essential (primary) hypertension: Secondary | ICD-10-CM

## 2022-02-20 NOTE — Progress Notes (Signed)
Established Patient Office Visit  Subjective:  Patient ID: Tammy Mcgrath, female    DOB: [birth year]  Age: 41 y.o. MRN: [id number]  CC:  Chief Complaint  Patient presents with   Follow-up    Pt following up for bp states she has been having high reading at home. C/o headache today, states FMLA paperwork filled out incorrectly.     HPI Tammy Mcgrath is a 41 y.o. female with past medical history of essential presents for f/u of chronic medical conditions.   HTN: diastolic elevated, reports feeling jittery and c/o of headaches. No medication taken for headaches.  She notes medication adherence and will be following up with neurology on 02/21/22.   Past Medical History:  Diagnosis Date   Chronic hypertension    Depression    not a formal dx, "is sad, not suicidal"   Eczema    Headache    Infection    UTI   Migraines     Past Surgical History:  Procedure Laterality Date   BREAST SURGERY     reduction   DILATION AND CURETTAGE OF UTERUS      Family History  Problem Relation Age of Onset   Hypertension Father    Stroke Father     Social History   Socioeconomic History   Marital status: Single    Spouse name: Not on file   Number of children: Not on file   Years of education: Not on file   Highest education level: Not on file  Occupational History   Not on file  Tobacco Use   Smoking status: Never   Smokeless tobacco: Never  Vaping Use   Vaping Use: Never used  Substance and Sexual Activity   Alcohol use: Not Currently   Drug use: Not Currently    Types: Marijuana    Comment: last was ~3 months ago   Sexual activity: Yes  Other Topics Concern   Not on file  Social History Narrative   Not on file   Social Determinants of Health   Financial Resource Strain: Not on file  Food Insecurity: Not on file  Transportation Needs: Not on file  Physical Activity: Not on file  Stress: Not on file  Social Connections: Not on file  Intimate Partner Violence:  Not on file    Outpatient Medications Prior to Visit  Medication Sig Dispense Refill   acetaminophen (TYLENOL) 500 MG tablet Take 1,000 mg by mouth every 6 (six) hours as needed for mild pain.     telmisartan (MICARDIS) 40 MG tablet TAKE 1 TABLET BY MOUTH EVERY DAY 30 tablet 1   Biotin w/ Vitamins C & E (HAIR/SKIN/NAILS PO) Take 1 capsule by mouth daily.     No facility-administered medications prior to visit.    No Known Allergies  ROS Review of Systems  Constitutional:  Negative for fatigue and fever.  Cardiovascular:  Negative for chest pain and palpitations.  Neurological:  Positive for headaches. Negative for dizziness and weakness.      Objective:    Physical Exam HENT:     Head: Normocephalic.  Cardiovascular:     Rate and Rhythm: Normal rate and regular rhythm.     Pulses: Normal pulses.     Heart sounds: Normal heart sounds.  Pulmonary:     Effort: Pulmonary effort is normal.     Breath sounds: Normal breath sounds.  Neurological:     Mental Status: She is alert.     BP (!) 135/93  Pulse (!) 59   Ht 5\' 9"  (1.753 m)   Wt 171 lb (77.6 kg)   SpO2 98%   BMI 25.25 kg/m  Wt Readings from Last 3 Encounters:  02/20/22 171 lb (77.6 kg)  02/03/22 173 lb 12.8 oz (78.8 kg)  01/20/22 169 lb 12.8 oz (77 kg)    No results found for: "TSH" Lab Results  Component Value Date   WBC 3.5 (L) 01/19/2022   HGB 13.1 01/19/2022   HCT 38.7 01/19/2022   MCV 82.5 01/19/2022   PLT 245 01/19/2022   Lab Results  Component Value Date   NA 139 01/19/2022   K 3.7 01/19/2022   CO2 28 01/19/2022   GLUCOSE 94 01/19/2022   BUN 7 01/19/2022   CREATININE 0.70 01/19/2022   BILITOT 0.4 06/21/2021   ALKPHOS 185 (H) 06/21/2021   AST 31 06/21/2021   ALT 31 06/21/2021   PROT 6.4 (L) 06/21/2021   ALBUMIN 2.7 (L) 06/21/2021   CALCIUM 8.7 (L) 01/19/2022   ANIONGAP 7 01/19/2022   No results found for: "CHOL" No results found for: "HDL" No results found for: "LDLCALC" No  results found for: "TRIG" No results found for: "CHOLHDL" No results found for: "HGBA1C"    Assessment & Plan:   Problem List Items Addressed This Visit       Cardiovascular and Mediastinum   Essential hypertension     Uncontrolled -will consider adding HCT at her next visit if elevated diastolic -f/u in 1 month -reports feeling jittery and c/o of headaches -admits to not drinking adequate fluid -recommended increasing fluid intake -no medication taken for headaches -recommended taking Tylenol for headaches   She notes medication adherence and will be following up with neurology on 02/21/22         No orders of the defined types were placed in this encounter.   Follow-up: Return in about 1 month (around 03/23/2022) for BP.    05/23/2022, FNP

## 2022-02-20 NOTE — Patient Instructions (Signed)
I appreciate the opportunity to provide care to you today!    Follow up:  1 month for BP      Please continue to a heart-healthy diet and increase your physical activities. Try to exercise for at least three times a week.      It was a pleasure to see you and I look forward to continuing to work together on your health and well-being. Please do not hesitate to call the office if you need care or have questions about your care.   Have a wonderful day and week. With Gratitude, Gilmore Laroche MSN, FNP-BC

## 2022-02-20 NOTE — Assessment & Plan Note (Signed)
Uncontrolled -will consider adding HCT at her next visit if elevated diastolic -f/u in 1 month -reports feeling jittery and c/o of headaches -admits to not drinking adequate fluid -recommended increasing fluid intake -no medication taken for headaches -recommended taking Tylenol for headaches   She notes medication adherence and will be following up with neurology on 02/21/22

## 2022-02-21 ENCOUNTER — Ambulatory Visit (INDEPENDENT_AMBULATORY_CARE_PROVIDER_SITE_OTHER): Payer: 59 | Admitting: Psychiatry

## 2022-02-21 ENCOUNTER — Encounter: Payer: Self-pay | Admitting: Psychiatry

## 2022-02-21 ENCOUNTER — Telehealth: Payer: Self-pay | Admitting: Family Medicine

## 2022-02-21 VITALS — BP 129/89 | HR 81 | Ht 69.0 in | Wt 172.0 lb

## 2022-02-21 DIAGNOSIS — G43109 Migraine with aura, not intractable, without status migrainosus: Secondary | ICD-10-CM

## 2022-02-21 MED ORDER — DICLOFENAC POTASSIUM 50 MG PO TABS
ORAL_TABLET | ORAL | 6 refills | Status: DC
Start: 2022-02-21 — End: 2022-07-13

## 2022-02-21 NOTE — Telephone Encounter (Signed)
Pt faxed over the FMLA from [employer]. She stated at her appt yesterday it was still incomplete. Can you please fill out again? Attached is the copy of the completed with the missing info marked on it.         (Noted/sleeved/copied)

## 2022-02-21 NOTE — Patient Instructions (Signed)
GENERAL HEADACHE INSTRUCTIONS Headache Preventive Treatment: Please keep in mind that it takes 4-6 weeks for the medication to start working well and 2-3 months at the appropriate dose before deciding if it will be useful or not. If it is not helping at all by this time, then we will discuss other medications to try. Supplements may take 3-6 months until you see full effect.   Natural supplements: Magnesium Oxide or Magnesium Glycinate 500 mg at bed (up to 800 mg daily) Coenzyme Q10 300 mg in AM Vitamin B2- 200 mg twice a day  Add 1 supplement at a time since even natural supplements can have undesirable side effects. You can sometimes buy supplements cheaper (especially Coenzyme Q10) at www.WebmailGuide.co.za or at [employer].  Vitamins and herbs that show potential:  Magnesium: Magnesium (250 mg twice a day or 500 mg at bed) has a relaxant effect on smooth muscles such as blood vessels. Individuals suffering from frequent or daily headache usually have low magnesium levels which can be increase with daily supplementation of 400-750 mg. Three trials found 40-90% average headache reduction  when used as a preventative. Magnesium also demonstrated the benefit in menstrually related migraine.  Magnesium is part of the messenger system in the serotonin cascade and it is a good muscle relaxant.  It is also useful for constipation which can be a side effect of other medications used to treat migraine. Good sources include nuts, whole grains, and tomatoes. Side Effects: loose stool/diarrhea Riboflavin (vitamin B 2) 200 mg twice a day. This vitamin assists nerve cells in the production of ATP a principal energy storing molecule.  It is necessary for many chemical reactions in the body.  There have been at least 3 clinical trials of riboflavin using 400 mg per day all of which suggested that migraine frequency can be decreased.  All 3 trials showed significant improvement in over half of migraine sufferers.  The  supplement is found in bread, cereal, milk, meat, and poultry.  Most Americans get more riboflavin than the recommended daily allowance, however riboflavin deficiency is not necessary for the supplements to help prevent headache. Side effects: energizing, green urine  Coenzyme Q10: This is present in almost all cells in the body and is critical component for the conversion of energy.  Recent studies have shown that a nutritional supplement of CoQ10 can reduce the frequency of migraine attacks by improving the energy production of cells as with riboflavin.  Doses of 150 mg twice a day have been shown to be effective.  HEADACHE DIET: Foods and beverages which may trigger migraine Note that only 20% of headache patients are food sensitive. You will know if you are food sensitive if you get a headache consistently 20 minutes to 2 hours after eating a certain food. Only cut out a food if it causes headaches, otherwise you might remove foods you enjoy! What matters most for diet is to eat a well balanced healthy diet full of vegetables and low fat protein, and to not miss meals.  Chocolate, other sweets ALL cheeses except cottage and cream cheese Dairy products, yogurt, sour cream, ice cream Liver Meat extracts (Bovril, Marmite, meat tenderizers) Meats or fish which have undergone aging, fermenting, pickling or smoking. These include: Hotdogs,salami,Lox,sausage, mortadellas,smoked salmon, pepperoni, Pickled herring Pods of broad bean (English beans, Chinese pea pods, [place] (fava) beans, lima and navy beans Ripe avocado, ripe banana Yeast extracts or active yeast preparations such as Brewer's or Fleishman's (commercial bakes goods are permitted) Tomato  based foods, pizza (lasagna, etc.)  MSG (monosodium glutamate) is disguised as many things; look for these common aliases: Monopotassium glutamate Autolysed yeast Hydrolysed protein Sodium caseinate "flavorings" "all natural  preservatives" Nutrasweet  Avoid all other foods that convincingly provoke headaches.  Resources: The Dizzy Lu Duffel Your Headache Diet, migrainestrong.com  https://www.aguirre.org/  Caffeine and Migraine For patients that have migraine, caffeine intake more than 3 days per week can lead to dependency and increased migraine frequency. I would recommend cutting back on your caffeine intake as best you can. The recommended amount of caffeine is 200-300 mg daily, although migraine patients may experience dependency at even lower doses. While you may notice an increase in headache temporarily, cutting back will be helpful for headaches in the long run. For more information on caffeine and migraine, visit: https://americanmigrainefoundation.org/resource-library/caffeine-and-migraine/  Headache Prevention Strategies:  1. Maintain a headache diary; learn to identify and avoid triggers.  - This can be a simple note where you log when you had a headache, associated symptoms, and medications used - There are several smartphone apps developed to help track migraines: Migraine Buddy, Migraine Monitor, Curelator N1-Headache App  Common triggers include: Emotional triggers: Emotional/Upset family or friends Emotional/Upset occupation Business reversal/success Anticipation anxiety Crisis-serious Post-crisis periodNew job/position   Physical triggers: Vacation Day Weekend Strenuous Exercise High Altitude Location New Move Menstrual Day Physical Illness Oversleep/Not enough sleep Weather changes Light: Photophobia or light sesnitivity treatment involves a balance between desensitization and reduction in overly strong input. Use dark polarized glasses outside, but not inside. Avoid bright or fluorescent light, but do not dim environment to the point that going into a normally lit room hurts. Consider FL-41 tint lenses, which reduce the most  irritating wavelengths without blocking too much light.  These can be obtained at axonoptics.com or theraspecs.com Foods: see list above.  2. Limit use of acute treatments (over-the-counter medications, triptans, etc.) to no more than 2 days per week or 10 days per month to prevent medication overuse headache (rebound headache).    3. Follow a regular schedule (including weekends and holidays): Don't skip meals. Eat a balanced diet. 8 hours of sleep nightly. Minimize stress. Exercise 30 minutes per day. Being overweight is associated with a 5 times increased risk of chronic migraine. Keep well hydrated and drink 6-8 glasses of water per day.  4. Initiate non-pharmacologic measures at the earliest onset of your headache. Rest and quiet environment. Relax and reduce stress. Breathe2Relax is a free app that can instruct you on    some simple relaxtion and breathing techniques. Http://Dawnbuse.com is a    free website that provides teaching videos on relaxation.  Also, there are  many apps that   can be downloaded for "mindful" relaxation.  An app called YOGA NIDRA will help walk you through mindfulness. Another app called Calm can be downloaded to give you a structured mindfulness guide with daily reminders and skill development. Headspace for guided meditation Mindfulness Based Stress Reduction Online Course: www.palousemindfulness.com Cold compresses.  5. Don't wait!! Take the maximum allowable dosage of prescribed medication at the first sign of migraine.  6. Compliance:  Take prescribed medication regularly as directed and at the first sign of a migraine.  7. Communicate:  Call your physician when problems arise, especially if your headaches change, increase in frequency/severity, or become associated with neurological symptoms (weakness, numbness, slurred speech, etc.).  8. Headache/pain management therapies: Consider various complementary methods, including medication, behavioral therapy,  psychological counselling, biofeedback, massage therapy, acupuncture, dry needling,  and other modalities.  Such measures may reduce the need for medications. Counseling for pain management, where patients learn to function and ignore/minimize their pain, seems to work very well.  9. Recommend changing family's attention and focus away from patient's headaches. Instead, emphasize daily activities. If first question of day is 'How are your headaches/Do you have a headache today?', then patient will constantly think about headaches, thus making them worse. Goal is to re-direct attention away from headaches, toward daily activities and other distractions.  10. Helpful Websites: www.AmericanHeadacheSociety.org VoipObserver.it www.headaches.org GolfingFamily.no www.achenet.org

## 2022-02-21 NOTE — Progress Notes (Signed)
Referring:  Glynn Octave, MD [address] [place],  [place] [address]  PCP: Gilmore Laroche, FNP  Neurology was asked to evaluate Tammy Mcgrath, a 41 year old female for a chief complaint of headaches.  Our recommendations of care will be communicated by shared medical record.    CC:  headaches  History provided from self  HPI:  Medical co-morbidities: HTN  The patient presents for evaluation of headaches. She has had migraines starting 13 years ago. They improved when she moved to [place], but worsened when she moved back to [place] 2 years ago. She currently has 5-6 migraines per month. They are described as frontal throbbing with associated photophobia and nausea. Will occasionally see flashing lights in her vision and have vertigo with her migraines as well. Headaches are worse around her period but occur other times of the month as well. They are not positional. She also notes that she will fast unintentionally for long periods of time and wonders if this may be contributing to her headaches.  Takes Tylenol as needed which does help. She tries to avoid taking medications if possible. Tried Imitrex once and felt so bad she went to the emergency room.  Headache History: Onset: 13 years ago Triggers: foods, dehydration, fasting, menstrual cycle Aura: flashing lights, "fireflies" Location: frontal Quality/Description: throbbing Associated Symptoms:  Photophobia: yes  Phonophobia: has decreased hearing at baseline  Nausea: yes Vomiting: no Other symptoms: brain fog, vertigo Worse with activity?: yes Duration of headaches: 24 hours  Headache days per month: 6 Headache free days per month: 24  Current Treatment: Abortive Tylenol  Preventative no  Prior Therapies                                 ibuprofen Imitrex - chills, shaking   LABS: CBC    Component Value Date/Time   WBC 3.5 (L) 01/19/2022 0947   RBC 4.69 01/19/2022 0947   HGB 13.1 01/19/2022 0947    HCT 38.7 01/19/2022 0947   PLT 245 01/19/2022 0947   MCV 82.5 01/19/2022 0947   MCH 27.9 01/19/2022 0947   MCHC 33.9 01/19/2022 0947   RDW 14.8 01/19/2022 0947   LYMPHSABS 1.3 01/19/2022 0947   MONOABS 0.3 01/19/2022 0947   EOSABS 0.2 01/19/2022 0947   BASOSABS 0.1 01/19/2022 0947      Latest Ref Rng & Units 01/19/2022    9:47 AM 06/21/2021   11:14 AM 06/20/2021    9:53 AM  CMP  Glucose 70 - 99 mg/dL 94  768  115   BUN 6 - 20 mg/dL 7  7  7    Creatinine 0.44 - 1.00 mg/dL  7.26  2.03   Sodium 135 - 145 mmol/L 139  132  136   Potassium 3.5 - 5.1 mmol/L 3.7  3.7  3.6   Chloride 98 - 111 mmol/L 104  104  108   CO2 22 - 32 mmol/L 28  20  20    Calcium 8.9 - 10.3 mg/dL 8.7  8.7  8.6   Total Protein 6.5 - 8.1 g/dL  6.4  6.0   Total Bilirubin 0.3 - 1.2 mg/dL  0.4  0.5   Alkaline Phos 38 - 126 U/L  185  163   AST 15 - 41 U/L  31  32   ALT 0 - 44 U/L  31  32      IMAGING:  MRI brain/IAC 2019:  unremarkable  CTV 01/19/22: No evidence of thrombosis. Partially empty sella, hypoplastic right transverse/sigmoid sinus  Imaging independently reviewed on February 21, 2022   Current Outpatient Medications on File Prior to Visit  Medication Sig Dispense Refill   acetaminophen (TYLENOL) 500 MG tablet Take 1,000 mg by mouth every 6 (six) hours as needed for mild pain.     telmisartan (MICARDIS) 40 MG tablet TAKE 1 TABLET BY MOUTH EVERY DAY 30 tablet 1   No current facility-administered medications on file prior to visit.     Allergies: No Known Allergies  Family History: Migraine or other headaches in the family:  yes Aneurysms in a first degree relative:  no Brain tumors in the family:  no Other neurological illness in the family:   no  Mother was adopted  Past Medical History: Past Medical History:  Diagnosis Date   Chronic hypertension    Depression    not a formal dx, "is sad, not suicidal"   Eczema    Headache    Infection    UTI   Migraines     Past Surgical  History Past Surgical History:  Procedure Laterality Date   BREAST SURGERY     reduction   DILATION AND CURETTAGE OF UTERUS      Social History: Social History   Tobacco Use   Smoking status: Never   Smokeless tobacco: Never  Vaping Use   Vaping Use: Never used  Substance Use Topics   Alcohol use: Not Currently   Drug use: Not Currently    Types: Marijuana    Comment: last was ~3 months ago    ROS: Negative for fevers, chills. Positive for headaches. All other systems reviewed and negative unless stated otherwise in HPI.   Physical Exam:   Vital Signs: BP 129/89   Pulse 81   Ht 5\' 9"  (1.753 m)   Wt 172 lb (78 kg)   BMI 25.40 kg/m  GENERAL: well appearing,in no acute distress,alert SKIN:  Color, texture, turgor normal. No rashes or lesions HEAD:  Normocephalic/atraumatic. CV:  RRR RESP: Normal respiratory effort MSK: no tenderness to palpation over occiput, neck, or shoulders  NEUROLOGICAL: Mental Status: Alert, oriented to person, place and time,Follows commands Cranial Nerves: PERRL, no papilledema visualized, visual fields intact to confrontation, extraocular movements intact, facial sensation intact, no facial droop or ptosis, hearing grossly intact, no dysarthria Motor: muscle strength 5/5 both upper and lower extremities,no drift, normal tone Reflexes: 2+ throughout Sensation: intact to light touch all 4 extremities Coordination: Finger-to- nose-finger intact bilaterally Gait: normal-based   IMPRESSION: 41 year old female with a history of HTN who presents for evaluation of migraines. Her current headache pattern is consistent with episodic migraine with aura. Recent CTV showed a partially empty sella, suspect this is an incidental finding as her headaches are not suggestive of IIH and no papilledema is seen on exam. She prefers not to take medications as much as possible. Is hesitant to try medication other than tylenol and ibuprofen as she tends to be  sensitive to medication side effects. Will start diclofenac as needed for migraine rescue. Discussed supplement options for migraine prevention including Mg, B2, and CoQ10. Discussed lifestyle measures including eating regular meals and staying hydrated.  PLAN: -Rescue: Start diclofenac 50-100 mg PRN  -Prevention: Supplement information discussed (Mg, B2, CoQ10) -Discussed lifestyle measures including eating regular meals and staying hydrated   I spent a total of 33 minutes chart reviewing and counseling the patient. Headache education was done.  Discussed treatment options including acute medications and natural supplements. Discussed medication side effects, adverse reactions and drug interactions. Written educational materials and patient instructions outlining all of the above were given.  Follow-up: 6 months   Ocie Doyne, MD 02/21/2022   3:39 PM

## 2022-02-22 NOTE — Telephone Encounter (Signed)
Forms received will hand to provider to complete

## 2022-02-27 NOTE — Telephone Encounter (Signed)
Patient picked up forms.

## 2022-03-06 ENCOUNTER — Other Ambulatory Visit: Payer: Self-pay | Admitting: Family Medicine

## 2022-03-20 ENCOUNTER — Encounter (INDEPENDENT_AMBULATORY_CARE_PROVIDER_SITE_OTHER): Payer: Self-pay | Admitting: Gastroenterology

## 2022-03-20 ENCOUNTER — Ambulatory Visit (INDEPENDENT_AMBULATORY_CARE_PROVIDER_SITE_OTHER): Payer: 59 | Admitting: Gastroenterology

## 2022-03-24 ENCOUNTER — Ambulatory Visit: Payer: 59 | Admitting: Family Medicine

## 2022-03-24 ENCOUNTER — Encounter: Payer: Self-pay | Admitting: Family Medicine

## 2022-03-24 VITALS — BP 152/98 | HR 87 | Ht 69.0 in | Wt 174.1 lb

## 2022-03-24 DIAGNOSIS — I10 Essential (primary) hypertension: Secondary | ICD-10-CM

## 2022-03-24 DIAGNOSIS — H918X3 Other specified hearing loss, bilateral: Secondary | ICD-10-CM | POA: Diagnosis not present

## 2022-03-24 DIAGNOSIS — G43711 Chronic migraine without aura, intractable, with status migrainosus: Secondary | ICD-10-CM | POA: Diagnosis not present

## 2022-03-24 NOTE — Progress Notes (Unsigned)
Established Patient Office Visit  Subjective:  Patient ID: Tammy Mcgrath, female    DOB: 07/29/1981  Age: 41 y.o. MRN: [id number]  CC:  Chief Complaint  Patient presents with   Follow-up    Pt following up, pt states she went to ear doctor, they advised for her to go to ENT to see if a hearing aid can be given because she has hearing loss on both ears.     HPI Tammy Mcgrath is a 41 y.o. female with past medical history of Essential hypertension presents for f/u of  chronic medical conditions. HTN: uncontrolled. Reports not taking her BP medication today. Reports taking her BP medication at bedtime at 12am.  Migraine with aura: She reported following up with the neurologist and was started on diclofenac PRN with preventative supplements that she reports not taking. She c/o of headache today, for which she has taken no medications. Hearing loss: reports complete hearing loss in the left ear and partial hearing loss in the right ear. She reports seeing an Engineer, production and will fax the records to review.   Past Medical History:  Diagnosis Date   Chronic hypertension    Depression    not a formal dx, "is sad, not suicidal"   Eczema    Headache    Infection    UTI   Migraines     Past Surgical History:  Procedure Laterality Date   BREAST SURGERY     reduction   DILATION AND CURETTAGE OF UTERUS      Family History  Problem Relation Age of Onset   Hypertension Father    Stroke Father     Social History   Socioeconomic History   Marital status: Single    Spouse name: Not on file   Number of children: Not on file   Years of education: Not on file   Highest education level: Not on file  Occupational History   Not on file  Tobacco Use   Smoking status: Never   Smokeless tobacco: Never  Vaping Use   Vaping Use: Never used  Substance and Sexual Activity   Alcohol use: Not Currently   Drug use: Not Currently    Types: Marijuana    Comment: last was ~3 months ago    Sexual activity: Yes  Other Topics Concern   Not on file  Social History Narrative   Not on file   Social Determinants of Health   Financial Resource Strain: Not on file  Food Insecurity: Not on file  Transportation Needs: Not on file  Physical Activity: Not on file  Stress: Not on file  Social Connections: Not on file  Intimate Partner Violence: Not on file    Outpatient Medications Prior to Visit  Medication Sig Dispense Refill   acetaminophen (TYLENOL) 500 MG tablet Take 1,000 mg by mouth every 6 (six) hours as needed for mild pain.     diclofenac (CATAFLAM) 50 MG tablet Take 50-100 mg (1-2 pills) at onset of migraine 8 tablet 6   telmisartan (MICARDIS) 40 MG tablet TAKE 1 TABLET BY MOUTH EVERY DAY 90 tablet 1   No facility-administered medications prior to visit.    No Known Allergies  ROS Review of Systems  Constitutional:  Negative for fatigue and fever.  HENT:  Positive for hearing loss.   Respiratory:  Negative for chest tightness and shortness of breath.   Neurological:  Positive for headaches. Negative for dizziness and light-headedness.      Objective:  Physical Exam HENT:     Head: Normocephalic.     Right Ear: External ear normal.     Left Ear: External ear normal.  Cardiovascular:     Rate and Rhythm: Normal rate and regular rhythm.     Pulses: Normal pulses.     Heart sounds: Normal heart sounds.  Pulmonary:     Effort: Pulmonary effort is normal.     Breath sounds: Normal breath sounds.  Neurological:     Mental Status: She is alert.     BP (!) 152/98 (BP Location: Left Arm)   Pulse 87   Ht 5\' 9"  (1.753 m)   Wt 174 lb 1.3 oz (79 kg)   SpO2 95%   BMI 25.71 kg/m  Wt Readings from Last 3 Encounters:  03/24/22 174 lb 1.3 oz (79 kg)  02/21/22 172 lb (78 kg)  02/20/22 171 lb (77.6 kg)    No results found for: "TSH" Lab Results  Component Value Date   WBC 3.5 (L) 01/19/2022   HGB 13.1 01/19/2022   HCT 38.7 01/19/2022   MCV 82.5  01/19/2022   PLT 245 01/19/2022   Lab Results  Component Value Date   NA 139 01/19/2022   K 3.7 01/19/2022   CO2 28 01/19/2022   GLUCOSE 94 01/19/2022   BUN 7 01/19/2022   CREATININE 0.70 01/19/2022   BILITOT 0.4 06/21/2021   ALKPHOS 185 (H) 06/21/2021   AST 31 06/21/2021   ALT 31 06/21/2021   PROT 6.4 (L) 06/21/2021   ALBUMIN 2.7 (L) 06/21/2021   CALCIUM 8.7 (L) 01/19/2022   ANIONGAP 7 01/19/2022   No results found for: "CHOL" No results found for: "HDL" No results found for: "LDLCALC" No results found for: "TRIG" No results found for: "CHOLHDL" No results found for: "HGBA1C"    Assessment & Plan:   Problem List Items Addressed This Visit       Cardiovascular and Mediastinum   Essential hypertension    Uncontrolled Reports not taking her BP medication today. No changes to the medication regimen, given that the patient did not take her meds today F/u in 2 weeks to assess BP      Chronic migraine without aura, with intractable migraine, so stated, with status migrainosus    She reported following up with the neurologist and was started on diclofenac PRN with preventative supplements that she reports not taking She c/o of headache today, for which she has taken no medications Encouraged adherence to treatment plan        Nervous and Auditory   Hearing loss    Reports complete hearing loss in the left ear and partial hearing loss in the right ear She reports seeing an otologist and will fax the records to review Encouraged to fax in reports to review before referring to ENT       No orders of the defined types were placed in this encounter.   Follow-up: Return in about 2 weeks (around 04/07/2022) for BP.    04/09/2022, FNP

## 2022-03-24 NOTE — Patient Instructions (Addendum)
I appreciate the opportunity to provide care to you today!    Follow up:  2 weeks  Limit your salt intake to 1500mg  daily     Please continue to a heart-healthy diet and increase your physical activities. Try to exercise for at least three times a week.      It was a pleasure to see you and I look forward to continuing to work together on your health and well-being. Please do not hesitate to call the office if you need care or have questions about your care.   Have a wonderful day and week. With Gratitude, MSN, FNP-BC

## 2022-03-26 DIAGNOSIS — H9190 Unspecified hearing loss, unspecified ear: Secondary | ICD-10-CM | POA: Insufficient documentation

## 2022-03-26 DIAGNOSIS — G43711 Chronic migraine without aura, intractable, with status migrainosus: Secondary | ICD-10-CM

## 2022-03-26 HISTORY — DX: Chronic migraine without aura, intractable, with status migrainosus: G43.711

## 2022-03-26 NOTE — Assessment & Plan Note (Signed)
She reported following up with the neurologist and was started on diclofenac PRN with preventative supplements that she reports not taking She c/o of headache today, for which she has taken no medications Encouraged adherence to treatment plan

## 2022-03-26 NOTE — Assessment & Plan Note (Signed)
Reports complete hearing loss in the left ear and partial hearing loss in the right ear She reports seeing an otologist and will fax the records to review Encouraged to fax in reports to review before referring to ENT

## 2022-03-26 NOTE — Assessment & Plan Note (Signed)
Uncontrolled Reports not taking her BP medication today. No changes to the medication regimen, given that the patient did not take her meds today F/u in 2 weeks to assess BP

## 2022-04-11 ENCOUNTER — Ambulatory Visit: Payer: 59 | Admitting: Family Medicine

## 2022-04-11 ENCOUNTER — Encounter: Payer: Self-pay | Admitting: Family Medicine

## 2022-04-11 VITALS — BP 142/94 | HR 80 | Ht 69.0 in | Wt 172.0 lb

## 2022-04-11 DIAGNOSIS — L209 Atopic dermatitis, unspecified: Secondary | ICD-10-CM

## 2022-04-11 DIAGNOSIS — L290 Pruritus ani: Secondary | ICD-10-CM | POA: Diagnosis not present

## 2022-04-11 DIAGNOSIS — L2089 Other atopic dermatitis: Secondary | ICD-10-CM | POA: Diagnosis not present

## 2022-04-11 DIAGNOSIS — I10 Essential (primary) hypertension: Secondary | ICD-10-CM

## 2022-04-11 HISTORY — DX: Atopic dermatitis, unspecified: L20.9

## 2022-04-11 MED ORDER — TRIAMCINOLONE ACETONIDE 0.1 % EX CREA: 1.0000 | TOPICAL_CREAM | Freq: Two times a day (BID) | CUTANEOUS | 0 refills | Status: DC

## 2022-04-11 MED ORDER — NIFEDIPINE ER OSMOTIC RELEASE 60 MG PO TB24: 60.0000 mg | ORAL_TABLET | Freq: Every day | ORAL | 1 refills | Status: DC

## 2022-04-11 NOTE — Assessment & Plan Note (Signed)
reports eczema flair-up on the nape of her neck and the skin between her buttocks Will treat with kenalog cream for eczema of the neck For anal eczema,  With treat with  hydrocortisone 1% cream in conjunction with a barrier cream (zinc oxide) Informed patient to apply hydrocortisone 1% cream to the skin between her buttock  twice daily for up to 2 weeks in conjunction with otc barrier cream (eg, zinc oxide); due to risk of skin atrophy, do not use hydrocortisone for >2 weeks

## 2022-04-11 NOTE — Assessment & Plan Note (Addendum)
controlled today reports taking Procardia starting on 04/08/22 Since beginning therapy, she denies headaches, dizziness, and blurred vision Will D/c telmisartan 40mg  and start patient on procardia 60 mg daily F/u in 1 month

## 2022-04-11 NOTE — Patient Instructions (Addendum)
I appreciate the opportunity to provide care to you today!      F/U: 1  months  Please pick up your prescription at the pharmacy      Please stop by your local pharmacy and get your Tdap and Shingles vaccine  Referrals today-    Please continue to a heart-healthy diet and increase your physical activities. Try to exercise for at least three times a week.      It was a pleasure to see you and I look forward to continuing to work together on your health and well-being. Please do not hesitate to call the office if you need care or have questions about your care.   Have a wonderful day and week. With Gratitude, Gilmore Laroche MSN, FNP-BC

## 2022-04-11 NOTE — Progress Notes (Signed)
Established Patient Office Visit  Subjective:  Patient ID: Tammy Mcgrath, female    DOB: [birth year]  Age: 41 y.o. MRN: [id number]  CC:  Chief Complaint  Patient presents with   2 Week Follow-up    Pt following up on bp, states she has been checking bp at home, states the new medication sent last time was not helpful, she went back to taking what she was previously taking Nifedipine.     HPI Tammy Mcgrath is a 41 y.o. female with past medical history of HTN presents for f/u of  chronic medical conditions.  HTN: controlled today: reports taking Procardia starting on 04/08/22. Since beginning therapy, she denies headaches, dizziness, and blurred vision.   Atopic dermatitis: reports eczema flair-up on the nape of her neck and the skin between her buttocks.   Past Medical History:  Diagnosis Date   Chronic hypertension    Depression    not a formal dx, "is sad, not suicidal"   Eczema    Headache    Infection    UTI   Migraines     Past Surgical History:  Procedure Laterality Date   BREAST SURGERY     reduction   DILATION AND CURETTAGE OF UTERUS      Family History  Problem Relation Age of Onset   Hypertension Father    Stroke Father     Social History   Socioeconomic History   Marital status: Single    Spouse name: Not on file   Number of children: Not on file   Years of education: Not on file   Highest education level: Not on file  Occupational History   Not on file  Tobacco Use   Smoking status: Never   Smokeless tobacco: Never  Vaping Use   Vaping Use: Never used  Substance and Sexual Activity   Alcohol use: Not Currently   Drug use: Not Currently    Types: Marijuana    Comment: last was ~3 months ago   Sexual activity: Yes  Other Topics Concern   Not on file  Social History Narrative   Not on file   Social Determinants of Health   Financial Resource Strain: Not on file  Food Insecurity: Not on file  Transportation Needs: Not on file   Physical Activity: Not on file  Stress: Not on file  Social Connections: Not on file  Intimate Partner Violence: Not on file    Outpatient Medications Prior to Visit  Medication Sig Dispense Refill   acetaminophen (TYLENOL) 500 MG tablet Take 1,000 mg by mouth every 6 (six) hours as needed for mild pain.     NIFEdipine (ADALAT CC) 30 MG 24 hr tablet Take 30 mg by mouth daily.     diclofenac (CATAFLAM) 50 MG tablet Take 50-100 mg (1-2 pills) at onset of migraine (Patient not taking: Reported on 04/11/2022) 8 tablet 6   telmisartan (MICARDIS) 40 MG tablet TAKE 1 TABLET BY MOUTH EVERY DAY (Patient not taking: Reported on 04/11/2022) 90 tablet 1   No facility-administered medications prior to visit.    No Known Allergies  ROS Review of Systems  Constitutional:  Negative for fatigue and fever.  Eyes:  Negative for visual disturbance.  Cardiovascular:  Negative for chest pain and palpitations.  Skin:  Positive for rash.  Neurological:  Negative for dizziness and headaches.  Psychiatric/Behavioral:  Negative for self-injury and suicidal ideas.       Objective:    Physical Exam HENT:  Head: Normocephalic.  Cardiovascular:     Rate and Rhythm: Normal rate.     Pulses: Normal pulses.     Heart sounds: Normal heart sounds.  Pulmonary:     Effort: Pulmonary effort is normal.     Breath sounds: Normal breath sounds.  Abdominal:     Palpations: Abdomen is soft.  Skin:    Findings: Rash (dry scaly poorly circumscribed plaques on the nape of the neck) present.  Neurological:     Mental Status: She is alert.     BP (!) 142/94   Pulse 80   Ht 5\' 9"  (1.753 m)   Wt 172 lb (78 kg)   SpO2 97%   BMI 25.40 kg/m  Wt Readings from Last 3 Encounters:  04/11/22 172 lb (78 kg)  03/24/22 174 lb 1.3 oz (79 kg)  02/21/22 172 lb (78 kg)    No results found for: "TSH" Lab Results  Component Value Date   WBC 3.5 (L) 01/19/2022   HGB 13.1 01/19/2022   HCT 38.7 01/19/2022   MCV  82.5 01/19/2022   PLT 245 01/19/2022   Lab Results  Component Value Date   NA 139 01/19/2022   K 3.7 01/19/2022   CO2 28 01/19/2022   GLUCOSE 94 01/19/2022   BUN 7 01/19/2022   CREATININE 0.70 01/19/2022   BILITOT 0.4 06/21/2021   ALKPHOS 185 (H) 06/21/2021   AST 31 06/21/2021   ALT 31 06/21/2021   PROT 6.4 (L) 06/21/2021   ALBUMIN 2.7 (L) 06/21/2021   CALCIUM 8.7 (L) 01/19/2022   ANIONGAP 7 01/19/2022   No results found for: "CHOL" No results found for: "HDL" No results found for: "LDLCALC" No results found for: "TRIG" No results found for: "CHOLHDL" No results found for: "HGBA1C"    Assessment & Plan:   Problem List Items Addressed This Visit       Cardiovascular and Mediastinum   Essential hypertension - Primary    controlled today reports taking Procardia starting on 04/08/22 Since beginning therapy, she denies headaches, dizziness, and blurred vision Will D/c telmisartan 40mg  and start patient on procardia 60 mg daily F/u in 1 month      Relevant Medications   NIFEdipine (ADALAT CC) 30 MG 24 hr tablet   NIFEdipine (PROCARDIA XL) 60 MG 24 hr tablet     Musculoskeletal and Integument   Atopic dermatitis    reports eczema flair-up on the nape of her neck and the skin between her buttocks Will treat with kenalog cream for eczema of the neck For anal eczema,  With treat with  hydrocortisone 1% cream in conjunction with a barrier cream (zinc oxide) Informed patient to apply to affected anal area twice daily for up to 2 weeks in conjunction with otc  barrier cream (eg, zinc oxide); due to risk of skin atrophy, do not use hydrocortisone for >2 weeks       Relevant Medications   triamcinolone cream (KENALOG) 0.1 %   Other Visit Diagnoses     Anal pruritus       Relevant Medications   hydrocortisone cream 1 %       Meds ordered this encounter  Medications   NIFEdipine (PROCARDIA XL) 60 MG 24 hr tablet    Sig: Take 1 tablet (60 mg total) by mouth daily.     Dispense:  60 tablet    Refill:  1   triamcinolone cream (KENALOG) 0.1 %    Sig: Apply 1 Application topically 2 (two)  times daily.    Dispense:  30 g    Refill:  0   DISCONTD: hydrocortisone cream 1 %    Sig: Apply 1 Application topically 2 (two) times daily.    Dispense:  30 g    Refill:  0   hydrocortisone cream 1 %    Sig: For Anal Pruritus: apply to affected anal area twice daily for up to 2 weeks in conjunction with a barrier cream (eg, zinc oxide); due to risk of skin atrophy, do not use hydrocortisone for >2 weeks    Dispense:  30 g    Refill:  0    Follow-up: Return in about 1 month (around 05/12/2022).    Gilmore Laroche, FNP

## 2022-04-12 MED ORDER — HYDROCORTISONE 1 % EX CREA: 1.0000 | TOPICAL_CREAM | Freq: Two times a day (BID) | CUTANEOUS | 0 refills | Status: DC

## 2022-04-12 MED ORDER — HYDROCORTISONE 1 % EX CREA: TOPICAL_CREAM | CUTANEOUS | 0 refills | Status: DC

## 2022-04-12 NOTE — Addendum Note (Signed)
Addended byGilmore Laroche on: 04/12/2022 12:06 PM   Modules accepted: Orders

## 2022-04-24 ENCOUNTER — Encounter: Payer: Self-pay | Admitting: Family Medicine

## 2022-04-24 ENCOUNTER — Other Ambulatory Visit: Payer: Self-pay

## 2022-04-24 DIAGNOSIS — H918X3 Other specified hearing loss, bilateral: Secondary | ICD-10-CM

## 2022-04-24 NOTE — Telephone Encounter (Signed)
Please place a referral  to ENT in [place] or [place]

## 2022-05-12 ENCOUNTER — Ambulatory Visit: Payer: 59 | Admitting: Family Medicine

## 2022-05-12 ENCOUNTER — Encounter: Payer: Self-pay | Admitting: Family Medicine

## 2022-05-12 VITALS — BP 162/98 | HR 92 | Ht 69.0 in | Wt 166.1 lb

## 2022-05-12 DIAGNOSIS — I10 Essential (primary) hypertension: Secondary | ICD-10-CM | POA: Diagnosis not present

## 2022-05-12 NOTE — Progress Notes (Signed)
Established Patient Office Visit  Subjective:  Patient ID: Tammy Mcgrath, female    DOB: 1980-08-25  Age: 41 y.o. MRN: [id number]  CC:  Chief Complaint  Patient presents with   Follow-up    Following up for bp.     HPI Tammy Mcgrath is a 41 y.o. female with past medical history of hypertension presents for f/u of  chronic medical conditions.  Hypertension:Uncontrolled.  She reports taking her blood pressure medication as prescribed but has been under tremendous stress.  She takes Procardia 60 mg daily and notes increased physical activities and implementing a heart-healthy diet.  She reports that her ambulatory readings are in the 130s systolic and 80s diastolic.  She denies chest pain, palpitation, shortness of breath, dizziness, and visual disturbances.   Past Medical History:  Diagnosis Date   Chronic hypertension    Depression    not a formal dx, "is sad, not suicidal"   Eczema    Headache    Infection    UTI   Migraines     Past Surgical History:  Procedure Laterality Date   BREAST SURGERY     reduction   DILATION AND CURETTAGE OF UTERUS      Family History  Problem Relation Age of Onset   Hypertension Father    Stroke Father     Social History   Socioeconomic History   Marital status: Single    Spouse name: Not on file   Number of children: Not on file   Years of education: Not on file   Highest education level: Not on file  Occupational History   Not on file  Tobacco Use   Smoking status: Never   Smokeless tobacco: Never  Vaping Use   Vaping Use: Never used  Substance and Sexual Activity   Alcohol use: Not Currently   Drug use: Not Currently    Types: Marijuana    Comment: last was ~3 months ago   Sexual activity: Yes  Other Topics Concern   Not on file  Social History Narrative   Not on file   Social Determinants of Health   Financial Resource Strain: Not on file  Food Insecurity: Not on file  Transportation Needs: Not on file   Physical Activity: Not on file  Stress: Not on file  Social Connections: Not on file  Intimate Partner Violence: Not on file    Outpatient Medications Prior to Visit  Medication Sig Dispense Refill   acetaminophen (TYLENOL) 500 MG tablet Take 1,000 mg by mouth every 6 (six) hours as needed for mild pain.     diclofenac (CATAFLAM) 50 MG tablet Take 50-100 mg (1-2 pills) at onset of migraine 8 tablet 6   hydrocortisone cream 1 % For Anal Pruritus: apply to affected anal area twice daily for up to 2 weeks in conjunction with a barrier cream (eg, zinc oxide); due to risk of skin atrophy, do not use hydrocortisone for >2 weeks 30 g 0   NIFEdipine (ADALAT CC) 30 MG 24 hr tablet Take 30 mg by mouth daily.     NIFEdipine (PROCARDIA XL) 60 MG 24 hr tablet Take 1 tablet (60 mg total) by mouth daily. 60 tablet 1   triamcinolone cream (KENALOG) 0.1 % Apply 1 Application topically 2 (two) times daily. 30 g 0   No facility-administered medications prior to visit.    No Known Allergies  ROS Review of Systems  Constitutional:  Negative for chills, fatigue and fever.  Eyes:  Negative for  redness and visual disturbance.  Respiratory:  Negative for chest tightness and shortness of breath.   Cardiovascular:  Negative for chest pain and palpitations.  Neurological:  Negative for dizziness, light-headedness and headaches.      Objective:    Physical Exam HENT:     Right Ear: External ear normal.     Left Ear: External ear normal.  Cardiovascular:     Rate and Rhythm: Normal rate and regular rhythm.     Pulses: Normal pulses.     Heart sounds: Normal heart sounds.  Pulmonary:     Effort: Pulmonary effort is normal.     Breath sounds: Normal breath sounds.  Neurological:     Mental Status: She is alert.     BP (!) 162/98 (BP Location: Left Arm)   Pulse 92   Ht 5\' 9"  (1.753 m)   Wt 166 lb 1.9 oz (75.4 kg)   SpO2 98%   BMI 24.53 kg/m  Wt Readings from Last 3 Encounters:  05/12/22 166  lb 1.9 oz (75.4 kg)  04/11/22 172 lb (78 kg)  03/24/22 174 lb 1.3 oz (79 kg)    No results found for: "TSH" Lab Results  Component Value Date   WBC 3.5 (L) 01/19/2022   HGB 13.1 01/19/2022   HCT 38.7 01/19/2022   MCV 82.5 01/19/2022   PLT 245 01/19/2022   Lab Results  Component Value Date   NA 139 01/19/2022   K 3.7 01/19/2022   CO2 28 01/19/2022   GLUCOSE 94 01/19/2022   BUN 7 01/19/2022   CREATININE 0.70 01/19/2022   BILITOT 0.4 06/21/2021   ALKPHOS 185 (H) 06/21/2021   AST 31 06/21/2021   ALT 31 06/21/2021   PROT 6.4 (L) 06/21/2021   ALBUMIN 2.7 (L) 06/21/2021   CALCIUM 8.7 (L) 01/19/2022   ANIONGAP 7 01/19/2022   No results found for: "CHOL" No results found for: "HDL" No results found for: "LDLCALC" No results found for: "TRIG" No results found for: "CHOLHDL" No results found for: "HGBA1C"    Assessment & Plan:   Problem List Items Addressed This Visit       Cardiovascular and Mediastinum   Essential hypertension - Primary    Uncontrolled She reports taking her blood pressure medication as prescribed but has been under tremendous stress She takes Procardia 60 mg daily and notes increased physical activities and implementing a heart-healthy diet She reports that her ambulatory readings are in the 379K systolic and 24O diastolic She denies chest pain, palpitation, shortness of breath, dizziness, and visual disturbances No changes made to her treatment regimen today Encouraged to continue on Procardia 60 mg daily Information about managing insurance provided to the patient        No orders of the defined types were placed in this encounter.   Follow-up: Return in about 2 months (around 07/12/2022).    Alvira Monday, FNP

## 2022-05-12 NOTE — Assessment & Plan Note (Signed)
Uncontrolled She reports taking her blood pressure medication as prescribed but has been under tremendous stress She takes Procardia 60 mg daily and notes increased physical activities and implementing a heart-healthy diet She reports that her ambulatory readings are in the 657Q systolic and 46N diastolic She denies chest pain, palpitation, shortness of breath, dizziness, and visual disturbances No changes made to her treatment regimen today Encouraged to continue on Procardia 60 mg daily Information about managing insurance provided to the patient

## 2022-05-12 NOTE — Patient Instructions (Addendum)
I appreciate the opportunity to provide care to you today!    Follow up:  2 months       Please continue to a heart-healthy diet and increase your physical activities. Try to exercise for 29mins at least three times a week.      It was a pleasure to see you and I look forward to continuing to work together on your health and well-being. Please do not hesitate to call the office if you need care or have questions about your care.   Have a wonderful day and week. With Gratitude, Alvira Monday MSN, FNP-BC

## 2022-07-06 IMAGING — CT CT VENOGRAM HEAD
1 of 10 series · 6 of 47 positions shown · non-contrast
Comparison: CT head 04/27/2010.

CLINICAL DATA: Dural venous sinus thrombosis suspected

EXAM:
CT VENOGRAM HEAD
TECHNIQUE: Venographic phase images of the brain were obtained following the
administration of intravenous contrast. Multiplanar reformats and
maximum intensity projections were generated.

[Series 7: head with · axial · 0.44mm/px · z∈[-129,-19]mm · 6 of 78 slices shown]
[im 12/78  brain]
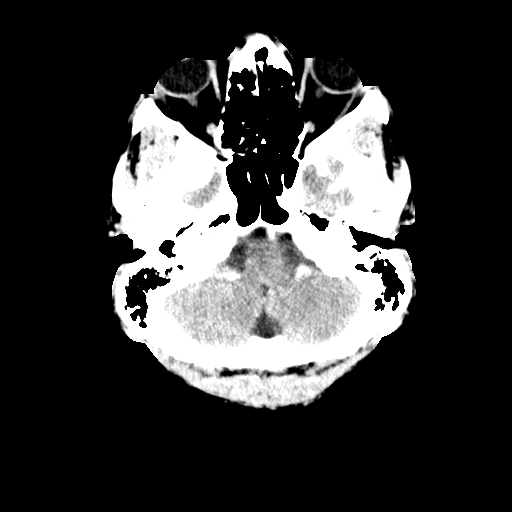
[im 23/78  bone]
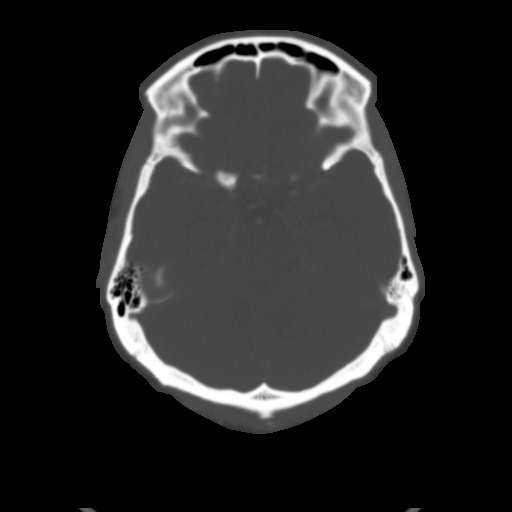
[im 34/78  brain]
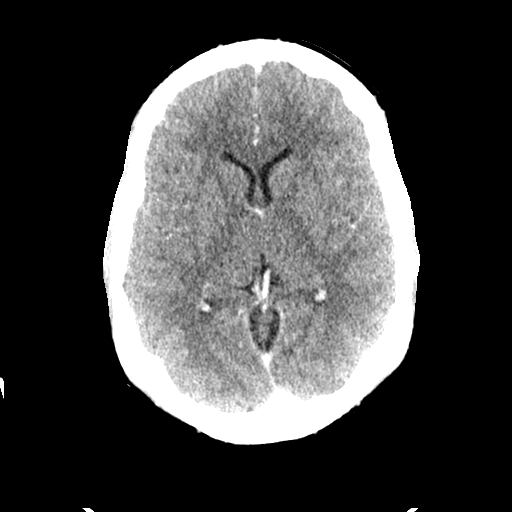
[im 45/78  bone]
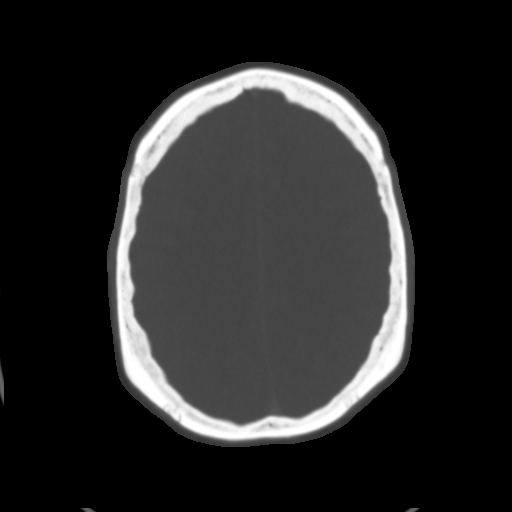
[im 56/78  brain]
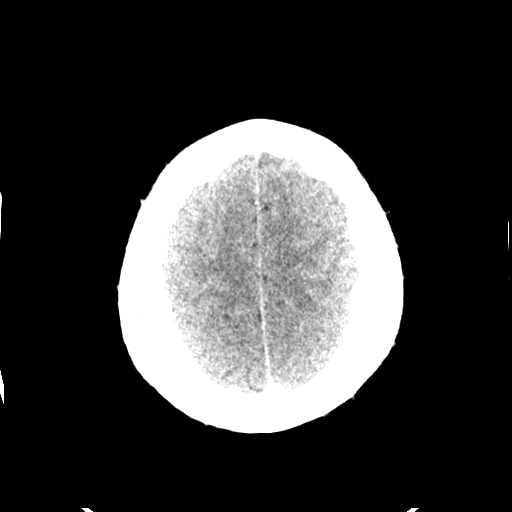
[im 67/78  bone]
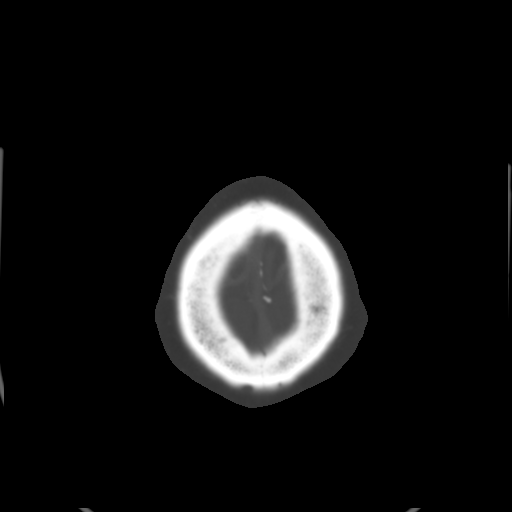

[6 of 47 positions shown; findings below may reference images not displayed]

RADIATION DOSE REDUCTION: This exam was performed according to the
departmental dose-optimization program which includes automated
exposure control, adjustment of the mA and/or kV according to
patient size and/or use of iterative reconstruction technique.

CONTRAST:  100mL OMNIPAQUE IOHEXOL 300 MG/ML  SOLN
FINDINGS: No evidence of dural venous sinus thrombosis. The superior sagittal
sinus, straight, transverse, and sigmoid sinuses are patent. The
right transverse and sigmoid sinuses are small/hypoplastic.

No evidence of acute large vascular territory infarct, acute
hemorrhage, midline shift, hydrocephalus, or visible extra-axial
fluid collection.
IMPRESSION: 1. No evidence of dural venous sinus thrombosis.
2. Partially empty sella and small/hypoplastic right
transverse/sigmoid sinuses. While nonspecific, findings can be seen
in the setting of idiopathic intracranial hypertension.

## 2022-07-10 ENCOUNTER — Inpatient Hospital Stay (HOSPITAL_COMMUNITY)
Admission: AD | Admit: 2022-07-10 | Discharge: 2022-07-10 | Discharge status: Against Medical Advice | Payer: 59 | Attending: Obstetrics & Gynecology | Admitting: Obstetrics & Gynecology

## 2022-07-10 ENCOUNTER — Encounter (HOSPITAL_COMMUNITY): Payer: Self-pay | Admitting: *Deleted

## 2022-07-10 DIAGNOSIS — Z3201 Encounter for pregnancy test, result positive: Secondary | ICD-10-CM | POA: Insufficient documentation

## 2022-07-10 DIAGNOSIS — R109 Unspecified abdominal pain: Secondary | ICD-10-CM | POA: Diagnosis present

## 2022-07-10 LAB — URINALYSIS, ROUTINE W REFLEX MICROSCOPIC
Bilirubin Urine: NEGATIVE
Glucose, UA: NEGATIVE mg/dL
Ketones, ur: NEGATIVE mg/dL
Leukocytes,Ua: NEGATIVE
Nitrite: NEGATIVE
Protein, ur: NEGATIVE mg/dL
Specific Gravity, Urine: 1.025 (ref 1.005–1.030)
pH: 6 (ref 5.0–8.0)

## 2022-07-10 LAB — PREGNANCY, URINE: Preg Test, Ur: POSITIVE — AB

## 2022-07-10 LAB — URINALYSIS, MICROSCOPIC (REFLEX)

## 2022-07-10 NOTE — MAU Note (Signed)
Tammy Mcgrath is a 41 y.o. at Unknown here in MAU reporting: +HPT last Friday.  Period is late.  Started spotting when expected period. Bright pinkish red.  Very light, was thick this morning.  Cramping in mid abd, not lower for a wk LMP: 10/1 Onset of complaint: got bad yesterday Pain score: 2,up to a 6 at times Vitals:   07/10/22 1849  BP: (!) 148/95  Pulse: 75  Resp: 18  Temp: 98.5 F (36.9 C)  SpO2: 100%      Lab orders placed from triage:  UPT< UA if +

## 2022-07-10 NOTE — MAU Note (Signed)
Called pt into room and as we got into room patient stated "I don't want to be seen I have waited too long." This RN asked pt to sign AMA form as she was walking out, pt refused and continued to walk out of lobby with support person.

## 2022-07-12 ENCOUNTER — Encounter: Payer: Self-pay | Admitting: Family Medicine

## 2022-07-12 ENCOUNTER — Ambulatory Visit: Payer: 59 | Admitting: Family Medicine

## 2022-07-12 VITALS — BP 118/84 | HR 98 | Ht 69.0 in | Wt 165.0 lb

## 2022-07-12 DIAGNOSIS — I10 Essential (primary) hypertension: Secondary | ICD-10-CM

## 2022-07-12 DIAGNOSIS — Z3490 Encounter for supervision of normal pregnancy, unspecified, unspecified trimester: Secondary | ICD-10-CM | POA: Diagnosis not present

## 2022-07-12 NOTE — Assessment & Plan Note (Signed)
Controlled Continue on current treatment regimen BP Readings from Last 3 Encounters:  07/12/22 118/84  07/10/22 (!) 148/95  05/12/22 (!) 162/98

## 2022-07-12 NOTE — Assessment & Plan Note (Addendum)
[facility] to be [facility] for spotting and cramping to assess the stability of her pregnancy and to rule out early pregnancy complications [facility]

## 2022-07-12 NOTE — Progress Notes (Signed)
09/05/2022  Established Patient Office Visit  Subjective:  Patient ID: Tammy Mcgrath, female    DOB: [birth year]  Age: 41 y.o. MRN: [id number]  CC:  Chief Complaint  Patient presents with   Follow-up    Htn f/u.     HPI Tammy Mcgrath is a 41 y.o. female with past medical history of hypertension presents for f/u of  chronic medical conditions.   Hypertension: Controlled.  She reports compliance with treatment regiment.  She takes Procardia 60 mg daily.  Pregnancy: Positive pregnancy test in the clinic today.  She complains of light spotting with abdominal cramping which she describes as period cramps, noting her cramping to be constant.  She reports her first appointment with GYN on 09/05/2022.   Past Medical History:  Diagnosis Date   Chronic hypertension    Depression    not a formal dx, "is sad, not suicidal"   Eczema    Headache    Infection    UTI   Migraines     Past Surgical History:  Procedure Laterality Date   BREAST SURGERY     reduction   DILATION AND CURETTAGE OF UTERUS      Family History  Problem Relation Age of Onset   Hypertension Father    Stroke Father     Social History   Socioeconomic History   Marital status: Single    Spouse name: Not on file   Number of children: Not on file   Years of education: Not on file   Highest education level: Not on file  Occupational History   Not on file  Tobacco Use   Smoking status: Never   Smokeless tobacco: Never  Vaping Use   Vaping Use: Never used  Substance and Sexual Activity   Alcohol use: Not Currently   Drug use: Not Currently    Types: Marijuana    Comment: last was ~3 months ago   Sexual activity: Yes  Other Topics Concern   Not on file  Social History Narrative   Not on file   Social Determinants of Health   Financial Resource Strain: Not on file  Food Insecurity: Not on file  Transportation Needs: Not on file  Physical Activity: Not on file  Stress: Not on file  Social  Connections: Not on file  Intimate Partner Violence: Not on file    Outpatient Medications Prior to Visit  Medication Sig Dispense Refill   hydrocortisone cream 1 % For Anal Pruritus: apply to affected anal area twice daily for up to 2 weeks in conjunction with a barrier cream (eg, zinc oxide); due to risk of skin atrophy, do not use hydrocortisone for >2 weeks 30 g 0   NIFEdipine (ADALAT CC) 30 MG 24 hr tablet Take 30 mg by mouth daily.     NIFEdipine (PROCARDIA XL) 60 MG 24 hr tablet Take 1 tablet (60 mg total) by mouth daily. 60 tablet 1   triamcinolone cream (KENALOG) 0.1 % Apply 1 Application topically 2 (two) times daily. 30 g 0   acetaminophen (TYLENOL) 500 MG tablet Take 1,000 mg by mouth every 6 (six) hours as needed for mild pain. (Patient not taking: Reported on 07/12/2022)     diclofenac (CATAFLAM) 50 MG tablet Take 50-100 mg (1-2 pills) at onset of migraine (Patient not taking: Reported on 07/12/2022) 8 tablet 6   No facility-administered medications prior to visit.    No Known Allergies  ROS Review of Systems  Constitutional:  Negative for chills and fever.  Respiratory:  Negative for wheezing.   Cardiovascular:  Negative for leg swelling.  Neurological:  Negative for tremors, weakness and numbness.      Objective:    Physical Exam HENT:     Head: Normocephalic.  Cardiovascular:     Rate and Rhythm: Normal rate and regular rhythm.     Heart sounds: Normal heart sounds.  Neurological:     Mental Status: She is alert.     BP 118/84   Pulse 98   Ht _0  (1.753 m)   Wt 165 lb 0.6 oz (74.9 kg)   LMP 05/14/2022   SpO2 98%   BMI 24.37 kg/m  Wt Readings from Last 3 Encounters:  07/13/22 167 lb 9.6 oz (76 kg)  07/12/22 165 lb 0.6 oz (74.9 kg)  07/10/22 169 lb 4.8 oz (76.8 kg)    Lab Results  Component Value Date   TSH 0.997 07/12/2022   Lab Results  Component Value Date   WBC 3.4 (L) 07/13/2022   HGB 12.7 07/13/2022   HCT 36.3 07/13/2022   MCV 80.5  07/13/2022   PLT 269 07/13/2022   Lab Results  Component Value Date   NA 139 07/12/2022   K 4.0 07/12/2022   CO2 23 07/12/2022   GLUCOSE 109 (H) 07/12/2022   BUN 7 07/12/2022   CREATININE 0.70 07/12/2022   BILITOT 0.6 07/12/2022   ALKPHOS 85 07/12/2022   AST 19 07/12/2022   ALT 9 07/12/2022   PROT 7.2 07/12/2022   ALBUMIN 4.4 07/12/2022   CALCIUM 9.1 07/12/2022   ANIONGAP 7 01/19/2022   EGFR 113 07/12/2022   Lab Results  Component Value Date   CHOL 208 (H) 07/12/2022   Lab Results  Component Value Date   HDL 58 07/12/2022   Lab Results  Component Value Date   LDLCALC 135 (H) 07/12/2022   Lab Results  Component Value Date   TRIG 82 07/12/2022   Lab Results  Component Value Date   CHOLHDL 3.6 07/12/2022   Lab Results  Component Value Date   HGBA1C 5.7 (H) 07/12/2022      Assessment & Plan:  Essential hypertension Assessment & Plan: Controlled Continue on current treatment regimen BP Readings from Last 3 Encounters:  07/12/22 118/84  07/10/22 (!) 148/95  05/12/22 (!) 162/98      Pregnancy, unspecified gestational age Assessment & Plan: Encourage patient to follow up with [facility] [place] to be examined for spotting and cramping to assess the stability of her pregnancy and to rule out early pregnancy complications [facility]   Orders: -     Ambulatory referral to Gynecology    Follow-up: Return in about 4 months (around 11/10/2022).   Alvira Monday, FNP

## 2022-07-12 NOTE — Patient Instructions (Addendum)
I appreciate the opportunity to provide care to you today!    Follow up:  4 months CPE                                                Congratulations on your pregnancy You will be contacted to review your labs Please review managing stress in adults at your leisure   Referral: [facility] ( [facility])   Please continue to a heart-healthy diet and increase your physical activities. Try to exercise for at least three times a week.      It was a pleasure to see you and I look forward to continuing to work together on your health and well-being. Please do not hesitate to call the office if you need care or have questions about your care.   Have a wonderful day and week. With Gratitude, Gilmore Laroche MSN, FNP-BC

## 2022-07-13 ENCOUNTER — Inpatient Hospital Stay (HOSPITAL_COMMUNITY): Payer: 59

## 2022-07-13 ENCOUNTER — Encounter (HOSPITAL_COMMUNITY): Payer: Self-pay | Admitting: Obstetrics and Gynecology

## 2022-07-13 ENCOUNTER — Inpatient Hospital Stay (HOSPITAL_COMMUNITY)
Admission: AD | Admit: 2022-07-13 | Discharge: 2022-07-13 | Disposition: A | Discharge status: Home / Self Care | Payer: 59 | Attending: Obstetrics and Gynecology | Admitting: Obstetrics and Gynecology

## 2022-07-13 DIAGNOSIS — O200 Threatened abortion: Secondary | ICD-10-CM | POA: Diagnosis not present

## 2022-07-13 DIAGNOSIS — O209 Hemorrhage in early pregnancy, unspecified: Secondary | ICD-10-CM

## 2022-07-13 DIAGNOSIS — O3680X0 Pregnancy with inconclusive fetal viability, not applicable or unspecified: Secondary | ICD-10-CM | POA: Insufficient documentation

## 2022-07-13 DIAGNOSIS — Z3A08 8 weeks gestation of pregnancy: Secondary | ICD-10-CM | POA: Diagnosis not present

## 2022-07-13 DIAGNOSIS — R109 Unspecified abdominal pain: Secondary | ICD-10-CM | POA: Diagnosis present

## 2022-07-13 DIAGNOSIS — O26891 Other specified pregnancy related conditions, first trimester: Secondary | ICD-10-CM | POA: Insufficient documentation

## 2022-07-13 DIAGNOSIS — O039 Complete or unspecified spontaneous abortion without complication: Secondary | ICD-10-CM | POA: Diagnosis not present

## 2022-07-13 LAB — CBC WITH DIFFERENTIAL/PLATELET
Basophils Absolute: 0.1 10*3/uL (ref 0.0–0.2)
Basos: 2 %
EOS (ABSOLUTE): 0.3 10*3/uL (ref 0.0–0.4)
Eos: 8 %
Hematocrit: 39.1 % (ref 34.0–46.6)
Hemoglobin: 13 g/dL (ref 11.1–15.9)
Immature Grans (Abs): 0 10*3/uL (ref 0.0–0.1)
Immature Granulocytes: 0 %
Lymphocytes Absolute: 1.3 10*3/uL (ref 0.7–3.1)
Lymphs: 32 %
MCH: 27.5 pg (ref 26.6–33.0)
MCHC: 33.2 g/dL (ref 31.5–35.7)
MCV: 83 fL (ref 79–97)
Monocytes Absolute: 0.3 10*3/uL (ref 0.1–0.9)
Monocytes: 9 %
Neutrophils Absolute: 2 10*3/uL (ref 1.4–7.0)
Neutrophils: 49 %
Platelets: 265 10*3/uL (ref 150–450)
RBC: 4.72 x10E6/uL (ref 3.77–5.28)
RDW: 14.3 % (ref 11.7–15.4)
WBC: 4 10*3/uL (ref 3.4–10.8)

## 2022-07-13 LAB — CMP14+EGFR
ALT: 9 IU/L (ref 0–32)
AST: 19 IU/L (ref 0–40)
Albumin/Globulin Ratio: 1.6 (ref 1.2–2.2)
Albumin: 4.4 g/dL (ref 3.9–4.9)
Alkaline Phosphatase: 85 IU/L (ref 44–121)
BUN/Creatinine Ratio: 10 (ref 9–23)
BUN: 7 mg/dL (ref 6–20)
Bilirubin Total: 0.6 mg/dL (ref 0.0–1.2)
CO2: 23 mmol/L (ref 20–29)
Calcium: 9.1 mg/dL (ref 8.7–10.2)
Chloride: 102 mmol/L (ref 96–106)
Creatinine, Ser: 0.7 mg/dL (ref 0.57–1.00)
Globulin, Total: 2.8 g/dL (ref 1.5–4.5)
Glucose: 109 mg/dL — ABNORMAL HIGH (ref 70–99)
Potassium: 4 mmol/L (ref 3.5–5.2)
Sodium: 139 mmol/L (ref 134–144)
Total Protein: 7.2 g/dL (ref 6.0–8.5)
eGFR: 113 mL/min/{1.73_m2} (ref >59)

## 2022-07-13 LAB — URINALYSIS, ROUTINE W REFLEX MICROSCOPIC
Bilirubin Urine: NEGATIVE
Glucose, UA: NEGATIVE mg/dL
Ketones, ur: NEGATIVE mg/dL
Leukocytes,Ua: NEGATIVE
Nitrite: NEGATIVE
Protein, ur: NEGATIVE mg/dL
Specific Gravity, Urine: 1.015 (ref 1.005–1.030)
pH: 5 (ref 5.0–8.0)

## 2022-07-13 LAB — CBC
HCT: 36.3 % (ref 36.0–46.0)
Hemoglobin: 12.7 g/dL (ref 12.0–15.0)
MCH: 28.2 pg (ref 26.0–34.0)
MCHC: 35 g/dL (ref 30.0–36.0)
MCV: 80.5 fL (ref 80.0–100.0)
Platelets: 269 10*3/uL (ref 150–400)
RBC: 4.51 MIL/uL (ref 3.87–5.11)
RDW: 14.6 % (ref 11.5–15.5)
WBC: 3.4 10*3/uL — ABNORMAL LOW (ref 4.0–10.5)
nRBC: 0 % (ref 0.0–0.2)

## 2022-07-13 LAB — LIPID PANEL
Chol/HDL Ratio: 3.6 ratio (ref 0.0–4.4)
Cholesterol, Total: 208 mg/dL — ABNORMAL HIGH (ref 100–199)
HDL: 58 mg/dL (ref >39)
LDL Chol Calc (NIH): 135 mg/dL — ABNORMAL HIGH (ref 0–99)
Triglycerides: 82 mg/dL (ref 0–149)
VLDL Cholesterol Cal: 15 mg/dL (ref 5–40)

## 2022-07-13 LAB — WET PREP, GENITAL
Clue Cells Wet Prep HPF POC: NONE SEEN
Sperm: NONE SEEN
Trich, Wet Prep: NONE SEEN
WBC, Wet Prep HPF POC: >=10 — AB (ref <10)
Yeast Wet Prep HPF POC: NONE SEEN

## 2022-07-13 LAB — TSH+FREE T4
Free T4: 1.32 ng/dL (ref 0.82–1.77)
TSH: 0.997 u[IU]/mL (ref 0.450–4.500)

## 2022-07-13 LAB — HEMOGLOBIN A1C
Est. average glucose Bld gHb Est-mCnc: 117 mg/dL
Hgb A1c MFr Bld: 5.7 % — ABNORMAL HIGH (ref 4.8–5.6)

## 2022-07-13 LAB — RPR: RPR Ser Ql: NONREACTIVE

## 2022-07-13 LAB — HIV ANTIBODY (ROUTINE TESTING W REFLEX): HIV Screen 4th Generation wRfx: NONREACTIVE

## 2022-07-13 LAB — ABO/RH: ABO/RH(D): B POS

## 2022-07-13 LAB — VITAMIN D 25 HYDROXY (VIT D DEFICIENCY, FRACTURES): Vit D, 25-Hydroxy: 35.7 ng/mL (ref 30.0–100.0)

## 2022-07-13 LAB — HCG, QUANTITATIVE, PREGNANCY: hCG, Beta Chain, Quant, S: 280 m[IU]/mL — ABNORMAL HIGH (ref <5)

## 2022-07-13 LAB — HEPATITIS C ANTIBODY: Hep C Virus Ab: NONREACTIVE

## 2022-07-13 NOTE — MAU Note (Addendum)
...  Tammy Mcgrath is a 41 y.o. at [redacted]w[redacted]d here in MAU reporting: Vaginal spotting that was initially light pink and is now a dark brown color that began around 10 days ago. She reports this past weekend her spotting stopped but then returned this past Monday. She reports she mostly sees the spotting when she wipes but it will occasionally get on her underwear. She is also endorsing intermittent lower abdominal cramping and reports it is worse in the afternoons. Denies recent IC. Denies vaginal itching and vaginal odors.  New OB visit with Dr. Adrian Blackwater on 1/23.  Patient reports she is hard of hearing. Patient reports it is helpful if our masks are pulled down or if we speak very loud. She reports she is comfortable with wearing hers still if we would pull ours down.  LMP: 05/14/2022 Onset of complaint:   Pain score:  6/10 mid to lower abdomen  Lab orders placed from triage:  UA

## 2022-07-13 NOTE — Discharge Instructions (Addendum)
Return to MAU: If you have heavier bleeding (especially bright red) that soaks through more that 2 pads per hour for an hour or more If you bleed so much that you feel like you might pass out or you do pass out If you have significant abdominal pain that is not improved with Tylenol 1000 mg every 8 hours as needed for pain If you develop a fever > 100.5    >Soak in a tub of warm water with 1/4 cup of Epsom salt in it, apply heating pad on low to medium heat in 15 min increments and Tylenol 1000 mg by mouth as needed for pain.

## 2022-07-13 NOTE — MAU Provider Note (Signed)
History     CSN: [id number]  Arrival date and time: 07/13/22 5102   Event Date/Time   First Provider Initiated Contact with Patient 07/13/22 [phone]      Chief Complaint  Patient presents with   Vaginal Bleeding   Abdominal Pain   [name] is a 41 y.o. year old [id number] female at [redacted]w[redacted]d weeks gestation by LMP of 05/14/2022 who presents to MAU reporting light pink vaginal spotting that started 10 days ago. She reports the color changed to a dark brown. She reports the spotting stopped completely this past weekend, but started back up on Monday 07/10/2022. She now only sees the spotting with wiping or sometimes in her underwear. Her main concern is the intermittent lower abdominal cramping she has been having. She reports the cramping is worse in the afternoons. She presented to MAU with these same complaints, but left "because it was taking too long." She was seen by her PCP yesterday and reported her sx's to them. She ws then advised that she needs to return to MAU for evaluation. She denies recent SI. She plans to receive [facility] with [facility]; next appt is 09/05/2022.   OB History     Gravida  4   Para  2   Term  2   Preterm      AB  1   Living  2      SAB  1   IAB      Ectopic      Multiple  0   Live Births  2           Past Medical History:  Diagnosis Date   Chronic hypertension    Depression    not a formal dx, "is sad, not suicidal"   Eczema    Headache    Infection    UTI   Migraines     Past Surgical History:  Procedure Laterality Date   BREAST SURGERY     reduction   DILATION AND CURETTAGE OF UTERUS      Family History  Problem Relation Age of Onset   Hypertension Father    Stroke Father     Social History   Tobacco Use   Smoking status: Never   Smokeless tobacco: Never  Vaping Use   Vaping Use: Never used  Substance Use Topics   Alcohol use: Not Currently   Drug use: Not Currently    Types: Marijuana    Comment: last was  ~3 months ago    Allergies: No Known Allergies  Medications Prior to Admission  Medication Sig Dispense Refill Last Dose   NIFEdipine (PROCARDIA XL) 60 MG 24 hr tablet Take 1 tablet (60 mg total) by mouth daily. 60 tablet 1 07/12/2022   acetaminophen (TYLENOL) 500 MG tablet Take 1,000 mg by mouth every 6 (six) hours as needed for mild pain. (Patient not taking: Reported on 07/12/2022)      diclofenac (CATAFLAM) 50 MG tablet Take 50-100 mg (1-2 pills) at onset of migraine (Patient not taking: Reported on 07/12/2022) 8 tablet 6    hydrocortisone cream 1 % For Anal Pruritus: apply to affected anal area twice daily for up to 2 weeks in conjunction with a barrier cream (eg, zinc oxide); due to risk of skin atrophy, do not use hydrocortisone for >2 weeks 30 g 0    NIFEdipine (ADALAT CC) 30 MG 24 hr tablet Take 30 mg by mouth daily.      triamcinolone cream (KENALOG) 0.1 %  Apply 1 Application topically 2 (two) times daily. 30 g 0     Review of Systems  Constitutional: Negative.   HENT: Negative.    Eyes: Negative.   Respiratory: Negative.    Cardiovascular: Negative.   Gastrointestinal: Negative.   Endocrine: Negative.   Genitourinary:  Positive for pelvic pain (intermittent, cramping) and vaginal bleeding (dark brown spotting).  Musculoskeletal: Negative.   Skin: Negative.   Allergic/Immunologic: Negative.   Neurological: Negative.   Hematological: Negative.   Psychiatric/Behavioral: Negative.     Physical Exam   Blood pressure 132/88, pulse 85, temperature 98.1 F (36.7 C), temperature source Oral, resp. rate 17, height 5\' 9"  (1.753 m), weight 76 kg, last menstrual period 05/14/2022, SpO2 99 %, not currently breastfeeding.  Physical Exam Vitals and nursing note reviewed.  Constitutional:      Appearance: Normal appearance. She is normal weight.  Cardiovascular:     Rate and Rhythm: Normal rate.  Pulmonary:     Effort: Pulmonary effort is normal.  Abdominal:     Palpations:  Abdomen is soft.  Genitourinary:    Comments: Swabs collected by blind swab - 07/14/2022, RN Musculoskeletal:        General: Normal range of motion.  Skin:    General: Skin is warm and dry.  Neurological:     Mental Status: She is alert and oriented to person, place, and time.  Psychiatric:        Mood and Affect: Mood normal.        Behavior: Behavior normal.        Thought Content: Thought content normal.        Judgment: Judgment normal.    MAU Course  Procedures  MDM [facility] UPT CBC ABO/Rh HCG Wet Prep GC/CT -- pending HIV -- pending OB < 14 wks Imagene Sheller with TV  Results for orders placed or performed during the hospital encounter of 07/13/22 (from the past 24 hour(s))  ABO/Rh     Status: None   Collection Time: 07/13/22  8:30 AM  Result Value Ref Range   ABO/RH(D)      B POS Performed at [facility], [address], [place], [place]   CBC     Status: Abnormal   Collection Time: 07/13/22  8:31 AM  Result Value Ref Range   WBC 3.4 (L) 4.0 - 10.5 K/uL   RBC 4.51 3.87 - 5.11 MIL/uL   Hemoglobin 12.7 12.0 - 15.0 g/dL   HCT 07/15/22 41.2 - 87.8 %   MCV 80.5 80.0 - 100.0 fL   MCH 28.2 26.0 - 34.0 pg   MCHC 35.0 30.0 - 36.0 g/dL   RDW 67.6 72.0 - 94.7 %   Platelets 269 150 - 400 K/uL   nRBC 0.0 0.0 - 0.2 %  hCG, quantitative, pregnancy     Status: Abnormal   Collection Time: 07/13/22  8:31 AM  Result Value Ref Range   hCG, Beta Chain, Quant, S 280 (H) <5 mIU/mL  Urinalysis, Routine w reflex microscopic Urine, Clean Catch     Status: Abnormal   Collection Time: 07/13/22  8:38 AM  Result Value Ref Range   Color, Urine YELLOW YELLOW   APPearance CLEAR CLEAR   Specific Gravity, Urine 1.015 1.005 - 1.030   pH 5.0 5.0 - 8.0   Glucose, UA NEGATIVE NEGATIVE mg/dL   Hgb urine dipstick SMALL (A) NEGATIVE   Bilirubin Urine NEGATIVE NEGATIVE   Ketones, ur NEGATIVE NEGATIVE mg/dL   Protein,  ur NEGATIVE NEGATIVE mg/dL   Nitrite NEGATIVE NEGATIVE    Leukocytes,Ua NEGATIVE NEGATIVE   RBC / HPF 0-5 0 - 5 RBC/hpf   WBC, UA 0-5 0 - 5 WBC/hpf   Bacteria, UA RARE (A) NONE SEEN   Mucus PRESENT    Hyaline Casts, UA PRESENT   Wet prep, genital     Status: Abnormal   Collection Time: 07/13/22  8:49 AM   Specimen: Vaginal  Result Value Ref Range   Yeast Wet Prep HPF POC NONE SEEN NONE SEEN   Trich, Wet Prep NONE SEEN NONE SEEN   Clue Cells Wet Prep HPF POC NONE SEEN NONE SEEN   WBC, Wet Prep HPF POC >=10 (A) <10   Sperm NONE SEEN     US OB LESS THAN 14 WEEKS WITH OB TRANSVAGINAL  Result Date: 07/13/2022 CLINICAL DATA:  Pregnant, abdominal pain, vaginal bleeding. LMP 05/14/2022 EXAM: OBSTETRIC <14 WK [place] AND TRANSVAGINAL OB US TECHNIQUE: Both transabdominal and transvaginal ultrasound examinations were performed for complete evaluation of the gestation as well as the maternal uterus, adnexal regions, and pelvic cul-de-sac. Transvaginal technique was performed to assess early pregnancy. COMPARISON:  None Available. FINDINGS: Intrauterine gestational sac: Present, single Yolk sac:  Not visualized Embryo:  Not visualized Cardiac Activity: Not applicable MSD: 2 mm   4 w   6 d CRL: Not applicable Subchorionic hemorrhage:  None visualized. Maternal uterus/adnexae: The uterus is anteverted. The cervix is unremarkable. And exophytic hypoechoic solid mass arises from the uterine fundus measuring 2.2 x 2.3 x 2.4 cm compatible with an exophytic uterine fibroid. No other intrauterine masses are seen. No free fluid within the cul-de-sac. The maternal ovaries are unremarkable. IMPRESSION: Intrauterine gestational sac identified. Follow-up sonography is recommended in 14-21 days to document appropriate progression and better age the gestation. Electronically Signed   By: Helyn Numbers M.D.   On: 07/13/2022 10:44     Assessment and Plan  1. Vaginal bleeding affecting early pregnancy - Information provided on vaginal bleeding in pregnancy - Return to MAU: If you  have heavier bleeding (especially bright red) that soaks through more that 2 pads per hour for an hour or more If you bleed so much that you feel like you might pass out or you do pass out If you have significant abdominal pain that is not improved with Tylenol 1000 mg every 8 hours as needed for pain If you develop a fever > 100.5    2. Abdominal pain during pregnancy in first trimester - Information provided on abdominal pain in pregnancy - Advised to soak in tub of warm water with Epsom salt, heating pad on low to medium heat in 15 min increments and Tylenol 1000 mg po prn pain  3. Threatened miscarriage in early pregnancy - Information provided on threatened miscarriage   4. Pregnancy with uncertain fetal viability, single or unspecified fetus - Repeat U/S in 10-14 days - Viability U/S scheduled for 07/25/2022 at 0900. Advised to arrive at 0845 for registration.  5. [redacted] weeks gestation of pregnancy  - Discharge patient  - Keep scheduled appt for viability U/S - Patient verbalized an understanding of the plan of care and agrees.   Raelyn Mora. CNM 07/13/2022, 9:15 AM

## 2022-07-14 ENCOUNTER — Encounter (HOSPITAL_COMMUNITY): Payer: Self-pay | Admitting: Obstetrics and Gynecology

## 2022-07-14 ENCOUNTER — Inpatient Hospital Stay (HOSPITAL_COMMUNITY)
Admission: AD | Admit: 2022-07-14 | Discharge: 2022-07-14 | Disposition: A | Discharge status: Home / Self Care | Payer: 59 | Attending: Obstetrics and Gynecology | Admitting: Obstetrics and Gynecology

## 2022-07-14 DIAGNOSIS — O039 Complete or unspecified spontaneous abortion without complication: Secondary | ICD-10-CM

## 2022-07-14 DIAGNOSIS — Z3A08 8 weeks gestation of pregnancy: Secondary | ICD-10-CM | POA: Diagnosis not present

## 2022-07-14 LAB — CBC
HCT: 34.5 % — ABNORMAL LOW (ref 36.0–46.0)
Hemoglobin: 12 g/dL (ref 12.0–15.0)
MCH: 27.8 pg (ref 26.0–34.0)
MCHC: 34.8 g/dL (ref 30.0–36.0)
MCV: 79.9 fL — ABNORMAL LOW (ref 80.0–100.0)
Platelets: 246 10*3/uL (ref 150–400)
RBC: 4.32 MIL/uL (ref 3.87–5.11)
RDW: 14.6 % (ref 11.5–15.5)
WBC: 5 10*3/uL (ref 4.0–10.5)
nRBC: 0 % (ref 0.0–0.2)

## 2022-07-14 LAB — HCG, QUANTITATIVE, PREGNANCY: hCG, Beta Chain, Quant, S: 228 m[IU]/mL — ABNORMAL HIGH (ref <5)

## 2022-07-14 LAB — GC/CHLAMYDIA PROBE AMP (~~LOC~~) NOT AT ARMC
Chlamydia: NEGATIVE
Comment: NEGATIVE
Comment: NORMAL
Neisseria Gonorrhea: NEGATIVE

## 2022-07-14 NOTE — MAU Provider Note (Signed)
History     CSN: [id number]  Arrival date and time: 07/14/22 1006   Event Date/Time   First Provider Initiated Contact with Patient 07/14/22 1108      No chief complaint on file.  Tammy Mcgrath is a 41 y.o. [id number] a [redacted]w[redacted]d who presents today with vaginal bleeding. She was seen yesterday, but reports later in the day after leaving the bleeding became much heavier. Now she is bleeding like a very heavy period and passing clots about the size of a quarter. She has cramps that are constant that she rates 7/10. She has not taken anything for pain today.   Vaginal Bleeding The patient's primary symptoms include pelvic pain and vaginal bleeding. This is a new problem. The current episode started yesterday. The problem occurs constantly. The problem has been gradually worsening. The problem affects both sides. She is pregnant. The vaginal bleeding is heavier than menses. She has been passing clots (about the size of a quarter). She has tried nothing for the symptoms.    OB History     Gravida  4   Para  2   Term  2   Preterm      AB  1   Living  2      SAB  1   IAB      Ectopic      Multiple  0   Live Births  2           Past Medical History:  Diagnosis Date   Chronic hypertension    Depression    not a formal dx, "is sad, not suicidal"   Eczema    Headache    Infection    UTI   Migraines     Past Surgical History:  Procedure Laterality Date   BREAST SURGERY     reduction   DILATION AND CURETTAGE OF UTERUS      Family History  Problem Relation Age of Onset   Other Mother        believes she has problems, but doens't go to dr or talk about it   Hypertension Father    Stroke Father     Social History   Tobacco Use   Smoking status: Never   Smokeless tobacco: Never  Vaping Use   Vaping Use: Never used  Substance Use Topics   Alcohol use: Not Currently   Drug use: Not Currently    Types: Marijuana    Comment: last was ~3 months ago     Allergies: No Known Allergies  Medications Prior to Admission  Medication Sig Dispense Refill Last Dose   hydrocortisone cream 1 % For Anal Pruritus: apply to affected anal area twice daily for up to 2 weeks in conjunction with a barrier cream (eg, zinc oxide); due to risk of skin atrophy, do not use hydrocortisone for >2 weeks 30 g 0 Past Week   NIFEdipine (PROCARDIA XL) 60 MG 24 hr tablet Take 1 tablet (60 mg total) by mouth daily. 60 tablet 1 07/14/2022   acetaminophen (TYLENOL) 500 MG tablet Take 1,000 mg by mouth every 6 (six) hours as needed for mild pain. (Patient not taking: Reported on 07/12/2022)      triamcinolone cream (KENALOG) 0.1 % Apply 1 Application topically 2 (two) times daily. 30 g 0     Review of Systems  Genitourinary:  Positive for pelvic pain and vaginal bleeding.  All other systems reviewed and are negative.  Physical Exam   Blood pressure Marland Kitchen)  144/95, pulse 83, temperature 98.2 F (36.8 C), temperature source Oral, resp. rate 16, height 5\' 9"  (1.753 m), weight 76.2 kg, last menstrual period 05/14/2022, SpO2 100 %, not currently breastfeeding.  Physical Exam Constitutional:      Appearance: She is well-developed.  HENT:     Head: Normocephalic.  Eyes:     Pupils: Pupils are equal, round, and reactive to light.  Cardiovascular:     Rate and Rhythm: Normal rate and regular rhythm.     Heart sounds: Normal heart sounds.  Pulmonary:     Effort: Pulmonary effort is normal. No respiratory distress.     Breath sounds: Normal breath sounds.  Abdominal:     Palpations: Abdomen is soft.     Tenderness: There is no abdominal tenderness.  Genitourinary:    Vagina: No bleeding. Vaginal discharge: mucusy.    Comments: External: no lesion Vagina: small amount of white discharge     Musculoskeletal:        General: Normal range of motion.     Cervical back: Normal range of motion and neck supple.  Skin:    General: Skin is warm and dry.  Neurological:      Mental Status: She is alert and oriented to person, place, and time.  Psychiatric:        Mood and Affect: Mood normal.        Behavior: Behavior normal.     Latest Reference Range & Units 07/13/22 08:31 07/14/22 10:42  HCG, Beta Chain, Quant, S <5 mIU/mL 280 (H) 228 (H)   Results for orders placed or performed during the hospital encounter of 07/14/22 (from the past 24 hour(s))  CBC     Status: Abnormal   Collection Time: 07/14/22 10:42 AM  Result Value Ref Range   WBC 5.0 4.0 - 10.5 K/uL   RBC 4.32 3.87 - 5.11 MIL/uL   Hemoglobin 12.0 12.0 - 15.0 g/dL   HCT 14/01/23 (L) 95.1 - 88.4 %   MCV 79.9 (L) 80.0 - 100.0 fL   MCH 27.8 26.0 - 34.0 pg   MCHC 34.8 30.0 - 36.0 g/dL   RDW 16.6 06.3 - 01.6 %   Platelets 246 150 - 400 K/uL   nRBC 0.0 0.0 - 0.2 %  hCG, quantitative, pregnancy     Status: Abnormal   Collection Time: 07/14/22 10:42 AM  Result Value Ref Range   hCG, Beta Chain, Quant, S 228 (H) <5 mIU/mL    MAU Course  Procedures  MDM DW patient that HCG is declining today which is indicative of a miscarriage. Offered continued expectant management or cytotec to take at home to complete the miscarriage. Patient elects for continued expectant management at this time . [place] message [place] to schedule a 2 week SAB follow up visit and cancel viability scan.   Assessment and Plan   1. SAB (spontaneous abortion)    DC home in stable condition  Comfort measures reviewed  1st  Trimester precautions  Bleeding precautions RX: none  Return to MAU as needed FU with OB as planned   Follow-up Information     [facility] Follow up.   Specialty: Obstetrics and Gynecology Why: They will call you with an appointment Contact information: [address] [place] [phone]               [phone] DNP, CNM  07/14/22  2:17 PM

## 2022-07-14 NOTE — MAU Note (Signed)
Tammy Mcgrath is a 41 y.o. at [redacted]w[redacted]d here in MAU reporting: was told to come back if she started passing clots.  Bleeding has gotten heavier. Onset of complaint: yesterday after she left Pain score: 7, more constant now BP144/95 P 83 R16 T 98.2  O2sat 100%  Lab orders placed from triage:  blood work drawn in triage

## 2022-07-25 ENCOUNTER — Other Ambulatory Visit: Payer: 59

## 2022-07-25 ENCOUNTER — Encounter: Payer: Self-pay | Admitting: General Practice

## 2022-07-25 ENCOUNTER — Ambulatory Visit: Payer: 59

## 2022-07-25 VITALS — BP 115/88 | HR 71 | Wt 164.0 lb

## 2022-07-25 DIAGNOSIS — O039 Complete or unspecified spontaneous abortion without complication: Secondary | ICD-10-CM | POA: Diagnosis not present

## 2022-07-25 DIAGNOSIS — Z5189 Encounter for other specified aftercare: Secondary | ICD-10-CM | POA: Diagnosis not present

## 2022-07-25 NOTE — Progress Notes (Signed)
GYNECOLOGY OFFICE VISIT NOTE: FOLLOW UP S/P SAB  History:  Tammy Mcgrath is a 41 y.o. [id number] here today for follow up after a SAB. She reports she has been having some brown discharge and cramping.  She reports she had been passing clots and passed one as big as her hand, but since that time nothing of concern. Patient denies issues with urination or diarrhea.  However, she states she is "not pooping right."  Reports hard to pass, but admits to not drinking plenty of fluids.  Patient reports she was not desiring children, but does not desire BCM.  She reports some grief with her recent loss and feels very much overwhelmed.  Patient is tearful during conversation, with provider, and is agreeable to counseling services.   Past Medical History:  Diagnosis Date   Chronic hypertension    Depression    not a formal dx, "is sad, not suicidal"   Eczema    Headache    Infection    UTI   Migraines     Past Surgical History:  Procedure Laterality Date   BREAST SURGERY     reduction   DILATION AND CURETTAGE OF UTERUS      The following portions of the patient's history were reviewed and updated as appropriate: allergies, current medications, past family history, past medical history, past social history, past surgical history and problem list.   Health Maintenance:  No pap on file, but reports .  No mammogram on file d/t age.   Review of Systems:  Genito-Urinary ROS: no dysuria, trouble voiding, or hematuria Gastrointestinal ROS: positive for - constipation Objective:  Vitals: LMP 05/14/2022   Physical Exam: Physical Exam Constitutional:      Appearance: Normal appearance.  HENT:     Head: Normocephalic and atraumatic.  Eyes:     Conjunctiva/sclera: Conjunctivae normal.  Pulmonary:     Effort: Pulmonary effort is normal. No respiratory distress.  Musculoskeletal:        General: Normal range of motion.     Cervical back: Normal range of motion.  Neurological:     Mental  Status: She is alert and oriented to person, place, and time.  Skin:    General: Skin is warm and dry.  Psychiatric:        Mood and Affect: Mood normal. Affect is tearful (Appropriately).        Behavior: Behavior normal.      Labs and Imaging: US OB LESS THAN 14 WEEKS WITH OB TRANSVAGINAL  Result Date: 07/13/2022 CLINICAL DATA:  Pregnant, abdominal pain, vaginal bleeding. LMP 05/14/2022 EXAM: OBSTETRIC <14 WK [place] AND TRANSVAGINAL OB US TECHNIQUE: Both transabdominal and transvaginal ultrasound examinations were performed for complete evaluation of the gestation as well as the maternal uterus, adnexal regions, and pelvic cul-de-sac. Transvaginal technique was performed to assess early pregnancy. COMPARISON:  None Available. FINDINGS: Intrauterine gestational sac: Present, single Yolk sac:  Not visualized Embryo:  Not visualized Cardiac Activity: Not applicable MSD: 2 mm   4 w   6 d CRL: Not applicable Subchorionic hemorrhage:  None visualized. Maternal uterus/adnexae: The uterus is anteverted. The cervix is unremarkable. And exophytic hypoechoic solid mass arises from the uterine fundus measuring 2.2 x 2.3 x 2.4 cm compatible with an exophytic uterine fibroid. No other intrauterine masses are seen. No free fluid within the cul-de-sac. The maternal ovaries are unremarkable. IMPRESSION: Intrauterine gestational sac identified. Follow-up sonography is recommended in 14-21 days to document appropriate progression and better age the gestation.  Electronically Signed   By: Helyn Numbers M.D.   On: 07/13/2022 10:44    Assessment & Plan:  41 year old F/U SAB   -Extensive discussion regarding current loss and coping. -Support, condolences, and reassurance given.  -Discussed taking time off work to allow time to grieve loss. -Patient agreeable. Given OOW note until Monday Dec 18th. -Will provide information for counseling services via mychart. -Patient declines BCM and encouraged to take PNV  daily. -Reviewed bleeding pattern s/p SAB. -Informed of need to RTO for annual exam with pap smear.   Total face-to-face time with patient: 25 minutes   Gerrit Heck, [place] 07/25/2022 9:48 AM

## 2022-07-26 LAB — BETA HCG QUANT (REF LAB): hCG Quant: 6 m[IU]/mL

## 2022-08-22 ENCOUNTER — Ambulatory Visit: Payer: 59 | Admitting: Family Medicine

## 2022-08-23 ENCOUNTER — Ambulatory Visit: Payer: 59 | Admitting: Family Medicine

## 2022-08-25 ENCOUNTER — Encounter: Payer: Self-pay | Admitting: General Practice

## 2022-08-28 ENCOUNTER — Encounter: Payer: Self-pay | Admitting: Internal Medicine

## 2022-08-28 ENCOUNTER — Ambulatory Visit (INDEPENDENT_AMBULATORY_CARE_PROVIDER_SITE_OTHER): Payer: 59 | Admitting: Internal Medicine

## 2022-08-28 VITALS — BP 135/89 | HR 80 | Resp 16 | Ht 69.0 in | Wt 169.0 lb

## 2022-08-28 DIAGNOSIS — I10 Essential (primary) hypertension: Secondary | ICD-10-CM | POA: Diagnosis not present

## 2022-08-28 DIAGNOSIS — F322 Major depressive disorder, single episode, severe without psychotic features: Secondary | ICD-10-CM | POA: Diagnosis not present

## 2022-08-28 MED ORDER — SERTRALINE HCL 50 MG PO TABS: 50.0000 mg | ORAL_TABLET | Freq: Every day | ORAL | 0 refills | Status: DC

## 2022-08-28 MED ORDER — NIFEDIPINE ER OSMOTIC RELEASE 60 MG PO TB24: 60.0000 mg | ORAL_TABLET | Freq: Every day | ORAL | 0 refills | Status: DC

## 2022-08-28 NOTE — Assessment & Plan Note (Addendum)
Patient BP is 135/89. Patient has been cutting salt out of her diet. She is out of Nifedipine. On Nifedipine since treatment of HTN started during prior pregnancy and tolerates this medication well.  Refilled - NIFEdipine (PROCARDIA XL) 60 MG 24 hr tablet; Take 1 tablet (60 mg total) by mouth daily.  Dispense: 90 tablet; Refill: 0

## 2022-08-28 NOTE — Assessment & Plan Note (Signed)
Patient reports feeling of depression since having twins and losing one of them. She had a miscarriage a few months ago. 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  She has a good support system at home, but finds a lot of people at her house often. This can be stressful. She has recently starting seeing a therapist. Her PHQ-9 score is 27. She has thoughts of SI , but no intention of harming herself.  Assessment/Plan:  - Continue seeing therapist - Start Zoloft 50 mg today - Follow up in 4 weeks or sooner if not improving or worsening depression

## 2022-08-28 NOTE — Progress Notes (Signed)
   HPI:Tammy Mcgrath is a 42 y.o. female who presents for evaluation of depression. For the details of today's visit, please refer to the assessment and plan.     Physical Exam: Vitals:   08/28/22 0923  BP: 135/89  Pulse: 80  Resp: 16  SpO2: 98%  Weight: 169 lb (76.7 kg)  Height: 5\' 9"  (1.753 m)     Physical Exam Constitutional:      Appearance: She is well-developed and well-groomed.     Comments: Wearing a hearing aid in right ear  HENT:     Right Ear: Tympanic membrane and ear canal normal.     Left Ear: Tympanic membrane and ear canal normal.  Eyes:     General: No scleral icterus.    Conjunctiva/sclera: Conjunctivae normal.  Cardiovascular:     Rate and Rhythm: Normal rate and regular rhythm.     Heart sounds: No murmur heard.    No friction rub. No gallop.  Pulmonary:     Effort: Pulmonary effort is normal.     Breath sounds: No wheezing, rhonchi or rales.  Musculoskeletal:     Right lower leg: No edema.     Left lower leg: No edema.  Skin:    General: Skin is warm and dry.  Psychiatric:        Mood and Affect: Mood is depressed. Affect is tearful.        08/28/2022    9:24 AM  Depression screen PHQ 2/9  Decreased Interest 3  Down, Depressed, Hopeless 3  PHQ - 2 Score 6  Altered sleeping 3  Tired, decreased energy 3  Change in appetite 3  Feeling bad or failure about yourself  3  Trouble concentrating 3  Moving slowly or fidgety/restless 3  Suicidal thoughts 3  PHQ-9 Score 27  Difficult doing work/chores Extremely dIfficult   Assessment & Plan:   Essential hypertension Patient BP is 135/89. Patient has been cutting salt out of her diet. She is out of Nifedipine. On Nifedipine since treatment of HTN started during prior pregnancy and tolerates this medication well.  Refilled - NIFEdipine (PROCARDIA XL) 60 MG 24 hr tablet; Take 1 tablet (60 mg total) by mouth daily.  Dispense: 90 tablet; Refill: 0   Current severe episode of major  depressive disorder without psychotic features without prior episode [facility]) Patient reports feeling of depression since having twins and losing one of them. She had a miscarriage a few months ago. 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  She has a good support system at home, but finds a lot of people at her house often. This can be stressful. She has recently starting seeing a therapist. Her PHQ-9 score is 27. She has thoughts of SI , but no intention of harming herself.  Assessment/Plan:  - Continue seeing therapist - Start Zoloft 50 mg today - Follow up in 4 weeks or sooner if not improving or worsening depression    Lorene Dy, MD

## 2022-08-28 NOTE — Patient Instructions (Signed)
Thank you for trusting me with your care.   Medications sent to your pharmacy. Follow up in 4 weeks.

## 2022-08-29 ENCOUNTER — Ambulatory Visit: Payer: 59 | Admitting: Family Medicine

## 2022-09-05 ENCOUNTER — Ambulatory Visit: Payer: 59 | Admitting: Advanced Practice Midwife

## 2022-09-11 ENCOUNTER — Other Ambulatory Visit: Payer: Self-pay | Admitting: Internal Medicine

## 2022-09-11 DIAGNOSIS — F322 Major depressive disorder, single episode, severe without psychotic features: Secondary | ICD-10-CM

## 2022-09-15 ENCOUNTER — Ambulatory Visit: Payer: 59 | Admitting: Family Medicine

## 2022-09-18 ENCOUNTER — Ambulatory Visit: Payer: 59 | Admitting: Psychiatry

## 2022-09-18 ENCOUNTER — Encounter: Payer: Self-pay | Admitting: Psychiatry

## 2022-09-18 NOTE — Progress Notes (Deleted)
   CC:  headaches  Follow-up Visit  Last visit: 02/21/22  Brief HPI: 42 year old female with a history of HTN who follows in clinic for migraines. MRI brain in 2019 was unremarkable. 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  At her last visit she was started on diclofenac for migraine rescue. Supplement information was provided.  Interval History: Headaches***   Headache days per month: *** Migraine days per month*** Headache free days per month: ***  Current Headache Regimen: Preventative: *** Abortive: ***   Prior Therapies                                  Ibuprofen diclofenac Imitrex - chills, shaking  Physical Exam:   Vital Signs: LMP 05/14/2022  GENERAL:  well appearing, in no acute distress, alert  SKIN:  Color, texture, turgor normal. No rashes or lesions HEAD:  Normocephalic/atraumatic. RESP: normal respiratory effort MSK:  No gross joint deformities.   NEUROLOGICAL: Mental Status: Alert, oriented to person, place and time, Follows commands, and Speech fluent and appropriate. Cranial Nerves: PERRL, face symmetric, no dysarthria, hearing grossly intact Motor: moves all extremities equally Gait: normal-based.  IMPRESSION: ***  PLAN: ***   Follow-up: ***  I spent a total of *** minutes on the date of the service. Headache education was done. Discussed lifestyle modification including increased oral hydration, decreased caffeine, exercise and stress management. Discussed treatment options including preventive and acute medications, natural supplements, and infusion therapy. Discussed medication overuse headache and to limit use of acute treatments to no more than 2 days/week or 10 days/month. Discussed medication side effects, adverse reactions and drug interactions. Written educational materials and patient instructions outlining all of the above were given.  Genia Harold, MD

## 2022-09-25 ENCOUNTER — Ambulatory Visit: Payer: 59 | Admitting: Family Medicine

## 2022-10-16 ENCOUNTER — Encounter: Payer: Self-pay | Admitting: Family Medicine

## 2022-10-16 ENCOUNTER — Ambulatory Visit (INDEPENDENT_AMBULATORY_CARE_PROVIDER_SITE_OTHER): Payer: 59 | Admitting: Family Medicine

## 2022-10-16 VITALS — BP 148/100 | HR 69 | Ht 69.0 in | Wt 169.1 lb

## 2022-10-16 DIAGNOSIS — I10 Essential (primary) hypertension: Secondary | ICD-10-CM | POA: Diagnosis not present

## 2022-10-16 DIAGNOSIS — E7849 Other hyperlipidemia: Secondary | ICD-10-CM

## 2022-10-16 DIAGNOSIS — E559 Vitamin D deficiency, unspecified: Secondary | ICD-10-CM | POA: Diagnosis not present

## 2022-10-16 DIAGNOSIS — F339 Major depressive disorder, recurrent, unspecified: Secondary | ICD-10-CM | POA: Diagnosis not present

## 2022-10-16 DIAGNOSIS — F322 Major depressive disorder, single episode, severe without psychotic features: Secondary | ICD-10-CM

## 2022-10-16 DIAGNOSIS — H903 Sensorineural hearing loss, bilateral: Secondary | ICD-10-CM

## 2022-10-16 DIAGNOSIS — R7301 Impaired fasting glucose: Secondary | ICD-10-CM

## 2022-10-16 DIAGNOSIS — E0789 Other specified disorders of thyroid: Secondary | ICD-10-CM

## 2022-10-16 NOTE — Progress Notes (Signed)
Established Patient Office Visit  Subjective:  Patient ID: Tammy Mcgrath, female    DOB: 11-13-1980  Age: 42 y.o. MRN: [id number]  CC:  Chief Complaint  Patient presents with   Follow-up    4 week f/u from visit on 08/28/22. Pt reports feeling well. Forgot to take bp medication this morning.     HPI Tammy Mcgrath is a 42 y.o. female with past medical history of essential hypertension, atopic dermatitis, and hearing loss presents for f/u of  chronic medical conditions. For the details of today's visit, please refer to the assessment and plan.     Past Medical History:  Diagnosis Date   Chronic hypertension    Depression    not a formal dx, "is sad, not suicidal"   Eczema    Headache    Infection    UTI   Migraines     Past Surgical History:  Procedure Laterality Date   BREAST SURGERY     reduction   DILATION AND CURETTAGE OF UTERUS      Family History  Problem Relation Age of Onset   Other Mother        believes she has problems, but doens't go to dr or talk about it   Hypertension Father    Stroke Father     Social History   Socioeconomic History   Marital status: Single    Spouse name: Not on file   Number of children: Not on file   Years of education: Not on file   Highest education level: Not on file  Occupational History   Not on file  Tobacco Use   Smoking status: Never   Smokeless tobacco: Never  Vaping Use   Vaping Use: Never used  Substance and Sexual Activity   Alcohol use: Not Currently   Drug use: Not Currently    Types: Marijuana    Comment: last was ~3 months ago   Sexual activity: Yes  Other Topics Concern   Not on file  Social History Narrative   Not on file   Social Determinants of Health   Financial Resource Strain: Not on file  Food Insecurity: Not on file  Transportation Needs: Not on file  Physical Activity: Not on file  Stress: Not on file  Social Connections: Not on file  Intimate Partner Violence: Not on file     Outpatient Medications Prior to Visit  Medication Sig Dispense Refill   NIFEdipine (PROCARDIA XL) 60 MG 24 hr tablet Take 1 tablet (60 mg total) by mouth daily. 90 tablet 0   sertraline (ZOLOFT) 50 MG tablet TAKE 1 TABLET BY MOUTH EVERY DAY (Patient not taking: Reported on 10/16/2022) 90 tablet 1   No facility-administered medications prior to visit.    No Known Allergies  ROS Review of Systems  Constitutional:  Negative for chills and fever.  Eyes:  Negative for visual disturbance.  Respiratory:  Negative for chest tightness and shortness of breath.   Neurological:  Negative for dizziness and headaches.  Psychiatric/Behavioral:  Negative for self-injury and suicidal ideas.       Objective:    Physical Exam HENT:     Head: Normocephalic.     Mouth/Throat:     Mouth: Mucous membranes are moist.  Cardiovascular:     Rate and Rhythm: Normal rate.     Heart sounds: Normal heart sounds.  Pulmonary:     Effort: Pulmonary effort is normal.     Breath sounds: Normal breath sounds.  Neurological:  Mental Status: She is alert.  Psychiatric:     Comments: tearful     BP (!) 148/100 (BP Location: Left Arm)   Pulse 69   Ht '5\' 9"'$  (1.753 m)   Wt 169 lb 1.3 oz (76.7 kg)   LMP 05/14/2022   SpO2 98%   BMI 24.97 kg/m  Wt Readings from Last 3 Encounters:  10/16/22 169 lb 1.3 oz (76.7 kg)  08/28/22 169 lb (76.7 kg)  07/25/22 164 lb (74.4 kg)    Lab Results  Component Value Date   TSH 0.997 07/12/2022   Lab Results  Component Value Date   WBC 5.0 07/14/2022   HGB 12.0 07/14/2022   HCT 34.5 (L) 07/14/2022   MCV 79.9 (L) 07/14/2022   PLT 246 07/14/2022   Lab Results  Component Value Date   NA 139 07/12/2022   K 4.0 07/12/2022   CO2 23 07/12/2022   GLUCOSE 109 (H) 07/12/2022   BUN 7 07/12/2022   CREATININE 0.70 07/12/2022   BILITOT 0.6 07/12/2022   ALKPHOS 85 07/12/2022   AST 19 07/12/2022   ALT 9 07/12/2022   PROT 7.2 07/12/2022   ALBUMIN 4.4 07/12/2022    CALCIUM 9.1 07/12/2022   ANIONGAP 7 01/19/2022   EGFR 113 07/12/2022   Lab Results  Component Value Date   CHOL 208 (H) 07/12/2022   Lab Results  Component Value Date   HDL 58 07/12/2022   Lab Results  Component Value Date   LDLCALC 135 (H) 07/12/2022   Lab Results  Component Value Date   TRIG 82 07/12/2022   Lab Results  Component Value Date   CHOLHDL 3.6 07/12/2022   Lab Results  Component Value Date   HGBA1C 5.7 (H) 07/12/2022      Assessment & Plan:  Depression, recurrent ([place]) Assessment & Plan: S/P SAB  Shared my condolences with the patient Patient is still grieving the loss of her baby She reports increased life stresses Denies suicidal thoughts and ideation Reports not taking Zoloft 50 mg daily due to side effects of the medication She is interested in speaking with a therapist and does not want to be started on another antidepressant Referral placed to integrated behavioral health for talk therapy    Sensorineural hearing loss (SNHL) of both ears Assessment & Plan: She followed up with Maryan Rued, DO  on 08/23/2022 for Sensorineural hearing loss (SNHL) of both ears  and 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 A referral was placed to audiology in [facility] for cochlear implant candidacy testing The patient reports that a cochlear implant will cost her close to $30,000 and she does not have the money to cover the cost of the implant currently She would like a second opinion for further evaluation and alternative treatments   Orders: -     Ambulatory referral to ENT  Current severe episode of major depressive disorder without psychotic features without prior episode ([place]) -     Amb ref to [place] hypertension Assessment & Plan: Uncontrolled She reports not taking her blood pressure medication today She takes Procardia 60 mg daily Patient is asymptomatic Encouraged to resume  therapy with Procardia 60 mg daily We will follow-up on BP in 4 weeks BP Readings from Last 3 Encounters:  10/16/22 (!) 148/100  08/28/22 135/89  07/25/22 115/88     Orders: -     CMP14+EGFR -     CBC with Differential/Platelet  Vitamin D deficiency -  VITAMIN D 25 Hydroxy (Vit-D Deficiency, Fractures)  IFG (impaired fasting glucose) -     Hemoglobin A1c  Other specified disorders of thyroid -     TSH + free T4  Other hyperlipidemia -     Lipid panel    Follow-up: Return in about 1 month (around 11/16/2022) for BP.   Alvira Monday, FNP

## 2022-10-16 NOTE — Assessment & Plan Note (Signed)
Uncontrolled She reports not taking her blood pressure medication today She takes Procardia 60 mg daily Patient is asymptomatic Encouraged to resume therapy with Procardia 60 mg daily We will follow-up on BP in 4 weeks BP Readings from Last 3 Encounters:  10/16/22 (!) 148/100  08/28/22 135/89  07/25/22 115/88

## 2022-10-16 NOTE — Patient Instructions (Addendum)
I appreciate the opportunity to provide care to you today!    Follow up:  1 months BP  Labs: please stop by the lab during the week  to get your blood drawn (CBC, CMP, TSH, Lipid profile, HgA1c, Vit D)   Referrals today-  Integrated behavioral health    Please continue to a heart-healthy diet and increase your physical activities. Try to exercise for 61mns at least five times a week.      It was a pleasure to see you and I look forward to continuing to work together on your health and well-being. Please do not hesitate to call the office if you need care or have questions about your care.   Have a wonderful day and week. With Gratitude, GAlvira MondayMSN, FNP-BC

## 2022-10-16 NOTE — Assessment & Plan Note (Signed)
S/P SAB  Shared my condolences with the patient Patient is still grieving the loss of her baby She reports increased life stresses Denies suicidal thoughts and ideation Reports not taking Zoloft 50 mg daily due to side effects of the medication She is interested in speaking with a therapist and does not want to be started on another antidepressant Referral placed to integrated behavioral health for talk therapy

## 2022-10-16 NOTE — Assessment & Plan Note (Signed)
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

## 2022-10-25 ENCOUNTER — Encounter: Payer: Self-pay | Admitting: Family Medicine

## 2022-11-08 ENCOUNTER — Ambulatory Visit: Payer: 59 | Admitting: Family Medicine

## 2022-11-08 ENCOUNTER — Encounter: Payer: 59 | Admitting: Family Medicine

## 2022-11-21 ENCOUNTER — Encounter: Payer: 59 | Admitting: Family Medicine

## 2022-11-21 LAB — CMP14+EGFR
ALT: 9 IU/L (ref 0–32)
AST: 16 IU/L (ref 0–40)
Albumin/Globulin Ratio: 1.5 (ref 1.2–2.2)
Albumin: 4.1 g/dL (ref 3.9–4.9)
Alkaline Phosphatase: 76 IU/L (ref 44–121)
BUN/Creatinine Ratio: 13 (ref 9–23)
BUN: 12 mg/dL (ref 6–20)
Bilirubin Total: 0.3 mg/dL (ref 0.0–1.2)
CO2: 23 mmol/L (ref 20–29)
Calcium: 9.1 mg/dL (ref 8.7–10.2)
Chloride: 104 mmol/L (ref 96–106)
Creatinine, Ser: 0.89 mg/dL (ref 0.57–1.00)
Globulin, Total: 2.8 g/dL (ref 1.5–4.5)
Glucose: 104 mg/dL — ABNORMAL HIGH (ref 70–99)
Potassium: 3.9 mmol/L (ref 3.5–5.2)
Sodium: 135 mmol/L (ref 134–144)
Total Protein: 6.9 g/dL (ref 6.0–8.5)
eGFR: 85 mL/min/{1.73_m2} (ref >59)

## 2022-11-21 LAB — CBC WITH DIFFERENTIAL/PLATELET
Basophils Absolute: 0.1 10*3/uL (ref 0.0–0.2)
Basos: 1 %
EOS (ABSOLUTE): 0.3 10*3/uL (ref 0.0–0.4)
Eos: 8 %
Hematocrit: 35.2 % (ref 34.0–46.6)
Hemoglobin: 11.1 g/dL (ref 11.1–15.9)
Immature Grans (Abs): 0 10*3/uL (ref 0.0–0.1)
Immature Granulocytes: 0 %
Lymphocytes Absolute: 1.2 10*3/uL (ref 0.7–3.1)
Lymphs: 31 %
MCH: 25.7 pg — ABNORMAL LOW (ref 26.6–33.0)
MCHC: 31.5 g/dL (ref 31.5–35.7)
MCV: 82 fL (ref 79–97)
Monocytes Absolute: 0.3 10*3/uL (ref 0.1–0.9)
Monocytes: 8 %
Neutrophils Absolute: 1.9 10*3/uL (ref 1.4–7.0)
Neutrophils: 52 %
Platelets: 217 10*3/uL (ref 150–450)
RBC: 4.32 x10E6/uL (ref 3.77–5.28)
RDW: 15.8 % — ABNORMAL HIGH (ref 11.7–15.4)
WBC: 3.7 10*3/uL (ref 3.4–10.8)

## 2022-11-21 LAB — LIPID PANEL
Chol/HDL Ratio: 3.2 ratio (ref 0.0–4.4)
Cholesterol, Total: 197 mg/dL (ref 100–199)
HDL: 62 mg/dL (ref >39)
LDL Chol Calc (NIH): 123 mg/dL — ABNORMAL HIGH (ref 0–99)
Triglycerides: 67 mg/dL (ref 0–149)
VLDL Cholesterol Cal: 12 mg/dL (ref 5–40)

## 2022-11-21 LAB — TSH+FREE T4
Free T4: 1.21 ng/dL (ref 0.82–1.77)
TSH: 1.17 u[IU]/mL (ref 0.450–4.500)

## 2022-11-21 LAB — VITAMIN D 25 HYDROXY (VIT D DEFICIENCY, FRACTURES): Vit D, 25-Hydroxy: 25.4 ng/mL — ABNORMAL LOW (ref 30.0–100.0)

## 2022-11-21 LAB — HEMOGLOBIN A1C
Est. average glucose Bld gHb Est-mCnc: 117 mg/dL
Hgb A1c MFr Bld: 5.7 % — ABNORMAL HIGH (ref 4.8–5.6)

## 2022-12-06 ENCOUNTER — Encounter: Payer: Self-pay | Admitting: Family Medicine

## 2022-12-06 ENCOUNTER — Ambulatory Visit (INDEPENDENT_AMBULATORY_CARE_PROVIDER_SITE_OTHER): Payer: 59 | Admitting: Family Medicine

## 2022-12-06 VITALS — BP 127/79 | HR 73 | Ht 69.0 in | Wt 171.0 lb

## 2022-12-06 DIAGNOSIS — H6093 Unspecified otitis externa, bilateral: Secondary | ICD-10-CM | POA: Diagnosis not present

## 2022-12-06 DIAGNOSIS — I10 Essential (primary) hypertension: Secondary | ICD-10-CM

## 2022-12-06 DIAGNOSIS — E038 Other specified hypothyroidism: Secondary | ICD-10-CM | POA: Diagnosis not present

## 2022-12-06 DIAGNOSIS — L259 Unspecified contact dermatitis, unspecified cause: Secondary | ICD-10-CM

## 2022-12-06 DIAGNOSIS — Z0001 Encounter for general adult medical examination with abnormal findings: Secondary | ICD-10-CM | POA: Diagnosis not present

## 2022-12-06 DIAGNOSIS — E7849 Other hyperlipidemia: Secondary | ICD-10-CM

## 2022-12-06 DIAGNOSIS — E559 Vitamin D deficiency, unspecified: Secondary | ICD-10-CM

## 2022-12-06 MED ORDER — HYDROCORTISONE-ACETIC ACID 1-2 % OT SOLN
3.0000 [drp] | Freq: Two times a day (BID) | OTIC | 0 refills | Status: DC
Start: 2022-12-06 — End: 2023-06-25

## 2022-12-06 NOTE — Patient Instructions (Addendum)
I appreciate the opportunity to provide care to you today!    Follow up:  4 months  Labs: please stop by the lab today to get your blood drawn (CBC, CMP, TSH, Lipid profile, Vit D)   Please continue to a heart-healthy diet and increase your physical activities. Try to exercise for at least five days a week.      It was a pleasure to see you and I look forward to continuing to work together on your health and well-being. Please do not hesitate to call the office if you need care or have questions about your care.   Have a wonderful day and week. With Gratitude, Gilmore Laroche MSN, FNP-BC   Health Maintenance, Female Adopting a healthy lifestyle and getting preventive care are important in promoting health and wellness. Ask your health care provider about: The right schedule for you to have regular tests and exams. Things you can do on your own to prevent diseases and keep yourself healthy. What should I know about diet, weight, and exercise? Eat a healthy diet  Eat a diet that includes plenty of vegetables, fruits, low-fat dairy products, and lean protein. Do not eat a lot of foods that are high in solid fats, added sugars, or sodium. Maintain a healthy weight Body mass index (BMI) is used to identify weight problems. It estimates body fat based on height and weight. Your health care provider can help determine your BMI and help you achieve or maintain a healthy weight. Get regular exercise Get regular exercise. This is one of the most important things you can do for your health. Most adults should: Exercise for at least 150 minutes each week. The exercise should increase your heart rate and make you sweat (moderate-intensity exercise). Do strengthening exercises at least twice a week. This is in addition to the moderate-intensity exercise. Spend less time sitting. Even light physical activity can be beneficial. Watch cholesterol and blood lipids Have your blood tested for  lipids and cholesterol at 40 years of age, then have this test every 5 years. Have your cholesterol levels checked more often if: Your lipid or cholesterol levels are high. You are older than 40 years of age. You are at high risk for heart disease. What should I know about cancer screening? Depending on your health history and family history, you may need to have cancer screening at various ages. This may include screening for: Breast cancer. Cervical cancer. Colorectal cancer. Skin cancer. Lung cancer. What should I know about heart disease, diabetes, and high blood pressure? Blood pressure and heart disease High blood pressure causes heart disease and increases the risk of stroke. This is more likely to develop in people who have high blood pressure readings or are overweight. Have your blood pressure checked: Every 3-5 years if you are 68-15 years of age. Every year if you are 31 years old or older. Diabetes Have regular diabetes screenings. This checks your fasting blood sugar level. Have the screening done: Once every three years after age 38 if you are at a normal weight and have a low risk for diabetes. More often and at a younger age if you are overweight or have a high risk for diabetes. What should I know about preventing infection? Hepatitis B If you have a higher risk for hepatitis B, you should be screened for this virus. Talk with your health care provider to find out if you are at risk for hepatitis B infection. Hepatitis C Testing is recommended  for: Everyone born from [birth year] through 1963. Anyone with known risk factors for hepatitis C. Sexually transmitted infections (STIs) Get screened for STIs, including gonorrhea and chlamydia, if: You are sexually active and are younger than 40 years of age. You are older than 40 years of age and your health care provider tells you that you are at risk for this type of infection. Your sexual activity has changed since you were last  screened, and you are at increased risk for chlamydia or gonorrhea. Ask your health care provider if you are at risk. Ask your health care provider about whether you are at high risk for HIV. Your health care provider may recommend a prescription medicine to help prevent HIV infection. If you choose to take medicine to prevent HIV, you should first get tested for HIV. You should then be tested every 3 months for as long as you are taking the medicine. Pregnancy If you are about to stop having your period (premenopausal) and you may become pregnant, seek counseling before you get pregnant. Take 400 to 800 micrograms (mcg) of folic acid every day if you become pregnant. Ask for birth control (contraception) if you want to prevent pregnancy. Osteoporosis and menopause Osteoporosis is a disease in which the bones lose minerals and strength with aging. This can result in bone fractures. If you are 37 years old or older, or if you are at risk for osteoporosis and fractures, ask your health care provider if you should: Be screened for bone loss. Take a calcium or vitamin D supplement to lower your risk of fractures. Be given hormone replacement therapy (HRT) to treat symptoms of menopause. Follow these instructions at home: Alcohol use Do not drink alcohol if: Your health care provider tells you not to drink. You are pregnant, may be pregnant, or are planning to become pregnant. If you drink alcohol: Limit how much you have to: 0-1 drink a day. Know how much alcohol is in your drink. In the [place] one drink equals one 12 oz bottle of beer (355 mL), one 5 oz glass of wine (148 mL), or one 1 oz glass of hard liquor (44 mL). Lifestyle Do not use any products that contain nicotine or tobacco. These products include cigarettes, chewing tobacco, and vaping devices, such as e-cigarettes. If you need help quitting, ask your health care provider. Do not use street drugs. Do not share needles. Ask your health  care provider for help if you need support or information about quitting drugs. General instructions Schedule regular health, dental, and eye exams. Stay current with your vaccines. Tell your health care provider if: You often feel depressed. You have ever been abused or do not feel safe at home. Summary Adopting a healthy lifestyle and getting preventive care are important in promoting health and wellness. Follow your health care provider's instructions about healthy diet, exercising, and getting tested or screened for diseases. Follow your health care provider's instructions on monitoring your cholesterol and blood pressure. This information is not intended to replace advice given to you by your health care provider. Make sure you discuss any questions you have with your health care provider. Document Revised: 12/20/2020 Document Reviewed: 12/20/2020 Elsevier Patient Education  2023 [employer].

## 2022-12-06 NOTE — Progress Notes (Unsigned)
Complete physical exam  Patient: Tammy Mcgrath   DOB: 12-19-1980   42 y.o. Female  MRN: [id number]  Subjective:    Chief Complaint  Patient presents with   Annual Exam   Hypertension    Nichole Duggin is a 42 y.o. female who presents today for a complete physical exam. She reports consuming a general diet. {types:19826} She generally feels poorly. She reports sleeping poorly. She {does/does not:200015} have additional problems to discuss today.    Most recent fall risk assessment:    10/16/2022    8:51 AM  Fall Risk   Falls in the past year? 0  Number falls in past yr: 0  Injury with Fall? 0  Risk for fall due to : No Fall Risks  Follow up Falls evaluation completed     Most recent depression screenings:    10/16/2022    8:51 AM 08/28/2022    9:24 AM  PHQ 2/9 Scores  PHQ - 2 Score 4 6  PHQ- 9 Score 16 27    Dental: No current dental problems and No regular dental care   Patient Active Problem List   Diagnosis Date Noted   Depression, recurrent 10/16/2022   Current severe episode of major depressive disorder without psychotic features without prior episode 08/28/2022   Atopic dermatitis 04/11/2022   Chronic migraine without aura, with intractable migraine, so stated, with status migrainosus 03/26/2022   Hearing loss 03/26/2022   Essential hypertension 01/22/2022   Encounter for induction of labor 06/21/2021   Chronic hypertension in pregnancy 05/13/2021   AMA (advanced maternal age) multigravida 35+ 05/12/2021   Pregnancy 05/12/2021   Past Medical History:  Diagnosis Date   Chronic hypertension    Depression    not a formal dx, "is sad, not suicidal"   Eczema    Headache    Infection    UTI   Migraines    Past Surgical History:  Procedure Laterality Date   BREAST SURGERY     reduction   DILATION AND CURETTAGE OF UTERUS     Social History   Tobacco Use   Smoking status: Never   Smokeless tobacco: Never  Vaping Use   Vaping Use: Never used   Substance Use Topics   Alcohol use: Not Currently   Drug use: Not Currently    Types: Marijuana    Comment: last was ~3 months ago   Social History   Socioeconomic History   Marital status: Single    Spouse name: Not on file   Number of children: Not on file   Years of education: Not on file   Highest education level: Not on file  Occupational History   Not on file  Tobacco Use   Smoking status: Never   Smokeless tobacco: Never  Vaping Use   Vaping Use: Never used  Substance and Sexual Activity   Alcohol use: Not Currently   Drug use: Not Currently    Types: Marijuana    Comment: last was ~3 months ago   Sexual activity: Yes  Other Topics Concern   Not on file  Social History Narrative   Not on file   Social Determinants of Health   Financial Resource Strain: Not on file  Food Insecurity: Not on file  Transportation Needs: Not on file  Physical Activity: Not on file  Stress: Not on file  Social Connections: Not on file  Intimate Partner Violence: Not on file   Family Status  Relation Name Status   Mother  Alive       doesn't go to dr's   Father  Alive   Family History  Problem Relation Age of Onset   Other Mother        believes she has problems, but doens't go to dr or talk about it   Hypertension Father    Stroke Father    No Known Allergies    Patient Care Team: Gilmore Laroche, FNP as PCP - General (Family Medicine)   Outpatient Medications Prior to Visit  Medication Sig   NIFEdipine (PROCARDIA XL) 60 MG 24 hr tablet Take 1 tablet (60 mg total) by mouth daily.   sertraline (ZOLOFT) 50 MG tablet TAKE 1 TABLET BY MOUTH EVERY DAY   No facility-administered medications prior to visit.    ROS     Objective:    BP 127/79   Pulse 73   Ht 5\' 9"  (1.753 m)   Wt 171 lb (77.6 kg)   LMP 11/16/2022   SpO2 95%   BMI 25.25 kg/m  BP Readings from Last 3 Encounters:  12/06/22 127/79  10/16/22 (!) 148/100  08/28/22 135/89   Wt Readings from  Last 3 Encounters:  12/06/22 171 lb (77.6 kg)  10/16/22 169 lb 1.3 oz (76.7 kg)  08/28/22 169 lb (76.7 kg)      Physical Exam  No results found for any visits on 12/06/22. Last CBC Lab Results  Component Value Date   WBC 3.7 11/20/2022   HGB 11.1 11/20/2022   HCT 35.2 11/20/2022   MCV 82 11/20/2022   MCH 25.7 (L) 11/20/2022   RDW 15.8 (H) 11/20/2022   PLT 217 11/20/2022   Last metabolic panel Lab Results  Component Value Date   GLUCOSE 104 (H) 11/20/2022   NA 135 11/20/2022   K 3.9 11/20/2022   CL 104 11/20/2022   CO2 23 11/20/2022   BUN 12 11/20/2022   CREATININE 0.89 11/20/2022   EGFR 85 11/20/2022   CALCIUM 9.1 11/20/2022   PROT 6.9 11/20/2022   ALBUMIN 4.1 11/20/2022   LABGLOB 2.8 11/20/2022   AGRATIO 1.5 11/20/2022   BILITOT 0.3 11/20/2022   ALKPHOS 76 11/20/2022   AST 16 11/20/2022   ALT 9 11/20/2022   ANIONGAP 7 01/19/2022   Last lipids Lab Results  Component Value Date   CHOL 197 11/20/2022   HDL 62 11/20/2022   LDLCALC 123 (H) 11/20/2022   TRIG 67 11/20/2022   CHOLHDL 3.2 11/20/2022   Last hemoglobin A1c Lab Results  Component Value Date   HGBA1C 5.7 (H) 11/20/2022   Last thyroid functions Lab Results  Component Value Date   TSH 1.170 11/20/2022   Last vitamin D Lab Results  Component Value Date   VD25OH 25.4 (L) 11/20/2022   Last vitamin B12 and Folate No results found for: "VITAMINB12", "FOLATE"      Assessment & Plan:    Routine Health Maintenance and Physical Exam  Immunization History  Administered Date(s) Administered   Tdap 06/22/2021    Health Maintenance  Topic Date Due   COVID-19 Vaccine (1) Never done   PAP SMEAR-Modifier  Never done   INFLUENZA VACCINE  03/15/2023   DTaP/Tdap/Td (2 - Td or Tdap) 06/23/2031   Hepatitis C Screening  Completed   HIV Screening  Completed   HPV VACCINES  Aged Out    Discussed health benefits of physical activity, and encouraged her to engage in regular exercise appropriate for  her age and condition.  Encounter for general adult medical  examination with abnormal findings    Return in 1 year (on 12/06/2023).     Gilmore Laroche, FNP

## 2022-12-07 DIAGNOSIS — H6093 Unspecified otitis externa, bilateral: Secondary | ICD-10-CM

## 2022-12-07 HISTORY — DX: Unspecified otitis externa, bilateral: H60.93

## 2022-12-07 LAB — VITAMIN D 25 HYDROXY (VIT D DEFICIENCY, FRACTURES): Vit D, 25-Hydroxy: 32.1 ng/mL (ref 30.0–100.0)

## 2022-12-07 NOTE — Assessment & Plan Note (Signed)
Complains of ear canal pruritus She reports a sensation of something in the ear Denies ear pain and  discharge No systemic symptoms reported We will treat today with VOSOL-HC otic solution

## 2022-12-07 NOTE — Assessment & Plan Note (Signed)
Physical exam as documented Discussed heart-healthy diet  Encouraged to Exercise: If you are able: 30 -60 minutes a day ,4 days a week, or 150 minutes a week. The longer the better. Combine stretch, strength, and aerobic activities Encourage to eat whole Food, Plant Predominant Nutrition is highly recommended: Eat Plenty of vegetables, Mushrooms, fruits, Legumes, Whole Grains, Nuts, seeds in lieu of processed meats, processed snacks/pastries red meat, poultry, eggs.  Will f/u in 1 year for CPE    

## 2022-12-08 LAB — CBC WITH DIFFERENTIAL/PLATELET
Basophils Absolute: 0.1 10*3/uL (ref 0.0–0.2)
Basos: 1 %
EOS (ABSOLUTE): 0.2 10*3/uL (ref 0.0–0.4)
Eos: 6 %
Hematocrit: 36.2 % (ref 34.0–46.6)
Hemoglobin: 11.4 g/dL (ref 11.1–15.9)
Immature Grans (Abs): 0 10*3/uL (ref 0.0–0.1)
Immature Granulocytes: 0 %
Lymphocytes Absolute: 1.2 10*3/uL (ref 0.7–3.1)
Lymphs: 34 %
MCH: 25.6 pg — ABNORMAL LOW (ref 26.6–33.0)
MCHC: 31.5 g/dL (ref 31.5–35.7)
MCV: 81 fL (ref 79–97)
Monocytes Absolute: 0.3 10*3/uL (ref 0.1–0.9)
Monocytes: 8 %
Neutrophils Absolute: 1.8 10*3/uL (ref 1.4–7.0)
Neutrophils: 51 %
Platelets: 225 10*3/uL (ref 150–450)
RBC: 4.46 x10E6/uL (ref 3.77–5.28)
RDW: 15.9 % — ABNORMAL HIGH (ref 11.7–15.4)
WBC: 3.6 10*3/uL (ref 3.4–10.8)

## 2022-12-08 LAB — COMPREHENSIVE METABOLIC PANEL
ALT: 12 IU/L (ref 0–32)
AST: 22 IU/L (ref 0–40)
Albumin/Globulin Ratio: 1.4 (ref 1.2–2.2)
Albumin: 4.2 g/dL (ref 3.9–4.9)
Alkaline Phosphatase: 75 IU/L (ref 44–121)
BUN/Creatinine Ratio: 12 (ref 9–23)
BUN: 9 mg/dL (ref 6–20)
Bilirubin Total: 0.5 mg/dL (ref 0.0–1.2)
CO2: 20 mmol/L (ref 20–29)
Calcium: 8.7 mg/dL (ref 8.7–10.2)
Chloride: 104 mmol/L (ref 96–106)
Creatinine, Ser: 0.73 mg/dL (ref 0.57–1.00)
Globulin, Total: 2.9 g/dL (ref 1.5–4.5)
Glucose: 90 mg/dL (ref 70–99)
Potassium: 4.2 mmol/L (ref 3.5–5.2)
Sodium: 139 mmol/L (ref 134–144)
Total Protein: 7.1 g/dL (ref 6.0–8.5)
eGFR: 107 mL/min/{1.73_m2} (ref >59)

## 2022-12-08 LAB — LIPID PANEL
Chol/HDL Ratio: 3.2 ratio (ref 0.0–4.4)
Cholesterol, Total: 199 mg/dL (ref 100–199)
HDL: 63 mg/dL (ref >39)
LDL Chol Calc (NIH): 122 mg/dL — ABNORMAL HIGH (ref 0–99)
Triglycerides: 76 mg/dL (ref 0–149)
VLDL Cholesterol Cal: 14 mg/dL (ref 5–40)

## 2022-12-08 LAB — TSH: TSH: 1.29 u[IU]/mL (ref 0.450–4.500)

## 2022-12-17 ENCOUNTER — Encounter: Payer: Self-pay | Admitting: Family Medicine

## 2022-12-19 ENCOUNTER — Telehealth: Payer: Self-pay | Admitting: Family Medicine

## 2022-12-19 NOTE — Telephone Encounter (Signed)
Forms printed. Will hand to you as soon as front desk gets their copy.

## 2022-12-19 NOTE — Telephone Encounter (Signed)
FMLA [employer]  Copied Noted  sleeved

## 2023-02-02 ENCOUNTER — Inpatient Hospital Stay (HOSPITAL_COMMUNITY): Payer: 59

## 2023-02-02 ENCOUNTER — Encounter (HOSPITAL_COMMUNITY): Payer: Self-pay | Admitting: Family Medicine

## 2023-02-02 ENCOUNTER — Inpatient Hospital Stay (HOSPITAL_COMMUNITY)
Admission: AD | Admit: 2023-02-02 | Discharge: 2023-02-02 | Disposition: A | Discharge status: Home / Self Care | Payer: 59 | Attending: Family Medicine | Admitting: Family Medicine

## 2023-02-02 ENCOUNTER — Other Ambulatory Visit: Payer: Self-pay

## 2023-02-02 DIAGNOSIS — Z3491 Encounter for supervision of normal pregnancy, unspecified, first trimester: Secondary | ICD-10-CM

## 2023-02-02 DIAGNOSIS — O200 Threatened abortion: Secondary | ICD-10-CM | POA: Insufficient documentation

## 2023-02-02 DIAGNOSIS — Z3A01 Less than 8 weeks gestation of pregnancy: Secondary | ICD-10-CM | POA: Insufficient documentation

## 2023-02-02 LAB — URINALYSIS, ROUTINE W REFLEX MICROSCOPIC
Bilirubin Urine: NEGATIVE
Glucose, UA: NEGATIVE mg/dL
Ketones, ur: NEGATIVE mg/dL
Nitrite: NEGATIVE
Protein, ur: NEGATIVE mg/dL
RBC / HPF: >50 RBC/hpf (ref 0–5)
Specific Gravity, Urine: 1.013 (ref 1.005–1.030)
pH: 5 (ref 5.0–8.0)

## 2023-02-02 LAB — WET PREP, GENITAL
Clue Cells Wet Prep HPF POC: NONE SEEN
Sperm: NONE SEEN
Trich, Wet Prep: NONE SEEN
WBC, Wet Prep HPF POC: >=10 — AB (ref <10)
Yeast Wet Prep HPF POC: NONE SEEN

## 2023-02-02 LAB — CBC
HCT: 33.7 % — ABNORMAL LOW (ref 36.0–46.0)
Hemoglobin: 11.4 g/dL — ABNORMAL LOW (ref 12.0–15.0)
MCH: 27 pg (ref 26.0–34.0)
MCHC: 33.8 g/dL (ref 30.0–36.0)
MCV: 79.7 fL — ABNORMAL LOW (ref 80.0–100.0)
Platelets: 195 10*3/uL (ref 150–400)
RBC: 4.23 MIL/uL (ref 3.87–5.11)
RDW: 17 % — ABNORMAL HIGH (ref 11.5–15.5)
WBC: 4.8 10*3/uL (ref 4.0–10.5)
nRBC: 0 % (ref 0.0–0.2)

## 2023-02-02 LAB — HCG, QUANTITATIVE, PREGNANCY: hCG, Beta Chain, Quant, S: 4266 m[IU]/mL — ABNORMAL HIGH (ref <5)

## 2023-02-02 NOTE — MAU Provider Note (Signed)
Chief Complaint: Abdominal Pain and Vaginal Bleeding   Event Date/Time   First Provider Initiated Contact with Patient 02/02/23 2058      SUBJECTIVE HPI: Tammy Mcgrath is a 42 y.o. [id number] at [redacted]w[redacted]d by LMP who presents to maternity admissions reporting vagina bleeding x 1 week with positive pregnancy test at home.  She started spotting ~ 1 week ago with mild cramping, then heavy bleeding 3 days ago with worsening pain. The bleeding is moderate now, lighter than it was 3 days ago, but still with clots and requiring a pad.  She denies any need for pain medication for the pain.   She denies vaginal bleeding, vaginal itching/burning, urinary symptoms, h/a, dizziness, n/v, or fever/chills.     HPI  Past Medical History:  Diagnosis Date  . Chronic hypertension   . Depression    not a formal dx, "is sad, not suicidal"  . Eczema   . Headache   . Infection    UTI  . Migraines    Past Surgical History:  Procedure Laterality Date  . BREAST SURGERY     reduction  . DILATION AND CURETTAGE OF UTERUS     Social History   Socioeconomic History  . Marital status: Single    Spouse name: Not on file  . Number of children: Not on file  . Years of education: Not on file  . Highest education level: Not on file  Occupational History  . Not on file  Tobacco Use  . Smoking status: Never  . Smokeless tobacco: Never  Vaping Use  . Vaping Use: Never used  Substance and Sexual Activity  . Alcohol use: Not Currently  . Drug use: Not Currently    Types: Marijuana    Comment: last was ~3 months ago  . Sexual activity: Yes  Other Topics Concern  . Not on file  Social History Narrative  . Not on file   Social Determinants of Health   Financial Resource Strain: Not on file  Food Insecurity: Not on file  Transportation Needs: Not on file  Physical Activity: Not on file  Stress: Not on file  Social Connections: Not on file  Intimate Partner Violence: Not on file   No current  facility-administered medications on file prior to encounter.   Current Outpatient Medications on File Prior to Encounter  Medication Sig Dispense Refill  . NIFEdipine (PROCARDIA XL) 60 MG 24 hr tablet Take 1 tablet (60 mg total) by mouth daily. 90 tablet 0  . acetic acid-hydrocortisone (VOSOL-HC) OTIC solution Place 3 drops into both ears 2 (two) times daily. 10 mL 0  . sertraline (ZOLOFT) 50 MG tablet TAKE 1 TABLET BY MOUTH EVERY DAY 90 tablet 1   No Known Allergies  ROS:  Review of Systems  Constitutional:  Negative for chills, fatigue and fever.  Respiratory:  Negative for shortness of breath.   Cardiovascular:  Negative for chest pain.  Genitourinary:  Negative for difficulty urinating, dysuria, flank pain, pelvic pain, vaginal bleeding, vaginal discharge and vaginal pain.  Neurological:  Negative for dizziness and headaches.  Psychiatric/Behavioral: Negative.       I have reviewed patient's Past Medical Hx, Surgical Hx, Family Hx, Social Hx, medications and allergies.   Physical Exam  Patient Vitals for the past 24 hrs:  BP Temp Temp src Pulse Resp SpO2 Height Weight  02/02/23 1923 130/82 98.5 F (36.9 C) Oral 81 17 100 % 5\' 9"  (1.753 m) 76.6 kg   Constitutional: Well-developed, well-nourished female in  no acute distress.  Cardiovascular: normal rate Respiratory: normal effort GI: Abd soft, non-tender. Pos BS x 4 MS: Extremities nontender, no edema, normal ROM Neurologic: Alert and oriented x 4.  GU: Neg CVAT.  PELVIC EXAM: Cervix pink, visually closed, without lesion, scant white creamy discharge, vaginal walls and external genitalia normal Bimanual exam: Cervix 0/long/high, firm, anterior, neg CMT, uterus nontender, nonenlarged, adnexa without tenderness, enlargement, or mass  FHT *** by doppler  LAB RESULTS Results for orders placed or performed during the hospital encounter of 02/02/23 (from the past 24 hour(s))  Urinalysis, Routine w reflex microscopic -Urine,  Clean Catch     Status: Abnormal   Collection Time: 02/02/23  7:40 PM  Result Value Ref Range   Color, Urine YELLOW YELLOW   APPearance HAZY (A) CLEAR   Specific Gravity, Urine 1.013 1.005 - 1.030   pH 5.0 5.0 - 8.0   Glucose, UA NEGATIVE NEGATIVE mg/dL   Hgb urine dipstick LARGE (A) NEGATIVE   Bilirubin Urine NEGATIVE NEGATIVE   Ketones, ur NEGATIVE NEGATIVE mg/dL   Protein, ur NEGATIVE NEGATIVE mg/dL   Nitrite NEGATIVE NEGATIVE   Leukocytes,Ua TRACE (A) NEGATIVE   RBC / HPF >50 0 - 5 RBC/hpf   WBC, UA 0-5 0 - 5 WBC/hpf   Bacteria, UA RARE (A) NONE SEEN   Squamous Epithelial / HPF 0-5 0 - 5 /HPF   Mucus PRESENT     --/--/B POS Performed at [facility], [address], [place], [place] [address]  (11/30 0830)  IMAGING No results found.  MAU Management/MDM: Orders Placed This Encounter  Procedures  . Wet prep, genital  . US OB LESS THAN 14 WEEKS WITH OB TRANSVAGINAL  . Urinalysis, Routine w reflex microscopic -Urine, Clean Catch  . CBC  . hCG, quantitative, pregnancy    No orders of the defined types were placed in this encounter.   Consult ***.  Treatments in MAU included ***. Pt discharged with strict *** precautions.  ASSESSMENT No diagnosis found.  PLAN Discharge home Allergies as of 02/02/2023   No Known Allergies   Med Rec must be completed prior to using this [facility]***        Sharen Counter Certified Nurse-Midwife 02/02/2023  8:58 PM

## 2023-02-02 NOTE — MAU Note (Signed)
.  Tammy Mcgrath is a 42 y.o. at Unknown here in MAU reporting: took pregnancy test 25 days ago - positive, took another one this morning - still positive. Been having VB for a month - was spotting, but for the past week is has been "thicker, like I can fill a pad". Has had constant cramping for 2 weeks  but sometimes the pain will shoot and become worse.  LMP: 12/09/2022 Onset of complaint: 1 month  Pain score: 6 Vitals:   02/02/23 1923  BP: 130/82  Pulse: 81  Resp: 17  Temp: 98.5 F (36.9 C)  SpO2: 100%     FHT:NA Lab orders placed from triage:  pregnancy, UA

## 2023-02-05 ENCOUNTER — Other Ambulatory Visit: Payer: Self-pay

## 2023-02-05 DIAGNOSIS — O200 Threatened abortion: Secondary | ICD-10-CM

## 2023-02-05 LAB — GC/CHLAMYDIA PROBE AMP (~~LOC~~) NOT AT ARMC
Chlamydia: NEGATIVE
Comment: NEGATIVE
Comment: NORMAL
Neisseria Gonorrhea: NEGATIVE

## 2023-02-05 LAB — POCT PREGNANCY, URINE: Preg Test, Ur: POSITIVE — AB

## 2023-02-05 NOTE — Progress Notes (Signed)
Follow up orders placed. Armandina Stammer RN

## 2023-02-14 ENCOUNTER — Ambulatory Visit: Payer: 59 | Admitting: Obstetrics & Gynecology

## 2023-02-14 ENCOUNTER — Ambulatory Visit (HOSPITAL_BASED_OUTPATIENT_CLINIC_OR_DEPARTMENT_OTHER)
Admission: RE | Admit: 2023-02-14 | Discharge: 2023-02-14 | Disposition: A | Discharge status: Home / Self Care | Payer: 59 | Source: Ambulatory Visit | Attending: Family Medicine | Admitting: Family Medicine

## 2023-02-14 DIAGNOSIS — O200 Threatened abortion: Secondary | ICD-10-CM | POA: Insufficient documentation

## 2023-02-21 ENCOUNTER — Telehealth: Payer: Self-pay

## 2023-02-21 NOTE — Telephone Encounter (Signed)
Erskine Squibb from [facility] called with a stat US report. Per Erskine Squibb, findings compatible with miscarriage with a small amount of hemorrhage in the endometrial cavity or a small, irregular gestational sac.  Otherwise, unremarkable examination. A message will be sent to the provider. Dariya Gainer l Adalid Beckmann, CMA

## 2023-02-26 ENCOUNTER — Other Ambulatory Visit: Payer: Self-pay | Admitting: Internal Medicine

## 2023-02-26 ENCOUNTER — Telehealth: Payer: Self-pay

## 2023-02-26 DIAGNOSIS — I10 Essential (primary) hypertension: Secondary | ICD-10-CM

## 2023-02-26 MED ORDER — NIFEDIPINE ER OSMOTIC RELEASE 60 MG PO TB24
60.0000 mg | ORAL_TABLET | Freq: Every day | ORAL | 0 refills | Status: DC
Start: 2023-02-26 — End: 2023-05-25

## 2023-02-26 NOTE — Telephone Encounter (Signed)
-----   Message from Levie Heritage sent at 02/23/2023  8:42 AM EDT ----- Regarding: RE: Ultrasound Can someone call her and see if she has been having more bleeding? That [place] was almost 10 days ago. ----- Message ----- From: Mikey Bussing, CMA Sent: 02/21/2023   4:19 PM EDT To: Levie Heritage, DO Subject: Ultrasound                                     [facility] called about this patient's ultrasound that was done on 02/14/23.

## 2023-02-26 NOTE — Telephone Encounter (Signed)
Called patient to see if her bleeding has stopped. Patient states she is just spotting now.  Rudean Icenhour l Quinn Quam, CMA

## 2023-02-28 ENCOUNTER — Telehealth (INDEPENDENT_AMBULATORY_CARE_PROVIDER_SITE_OTHER): Payer: 59 | Admitting: Family Medicine

## 2023-02-28 DIAGNOSIS — Z3A01 Less than 8 weeks gestation of pregnancy: Secondary | ICD-10-CM | POA: Diagnosis not present

## 2023-02-28 DIAGNOSIS — O039 Complete or unspecified spontaneous abortion without complication: Secondary | ICD-10-CM

## 2023-02-28 NOTE — Progress Notes (Signed)
GYNECOLOGY VIRTUAL VISIT ENCOUNTER NOTE  Provider location: Center for [facility] at [facility]   Patient location: Home  I connected with Tammy Mcgrath on 02/28/23 at  3:30 PM EDT by MyChart Video Encounter and verified that I am speaking with the correct person using two identifiers.   I discussed the limitations, risks, security and privacy concerns of performing an evaluation and management service virtually and the availability of in person appointments. I also discussed with the patient that there may be a patient responsible charge related to this service. The patient expressed understanding and agreed to proceed.   History:  Tammy Mcgrath is a 42 y.o. [phone] female being evaluated today for SAB follow up.  She had heavy bleeding for a few days a couple of weeks after being diagnosed with SAB.  She did not get any medications, but passed tissue and clots on her own.  Her bleeding has been improving over the past week with brownish discharge currently.. She denies any abnormal vaginal discharge, bleeding, pelvic pain or other concerns.       Past Medical History:  Diagnosis Date   Chronic hypertension    Depression    not a formal dx, "is sad, not suicidal"   Eczema    Headache    Infection    UTI   Migraines    Past Surgical History:  Procedure Laterality Date   BREAST SURGERY     reduction   DILATION AND CURETTAGE OF UTERUS     The following portions of the patient's history were reviewed and updated as appropriate: allergies, current medications, past family history, past medical history, past social history, past surgical history and problem list.    Review of Systems:  Pertinent items noted in HPI and remainder of comprehensive ROS otherwise negative.  Physical Exam:   General:  Alert, oriented and cooperative. Patient appears to be in no acute distress.  Mental Status: Normal mood and affect. Normal behavior. Normal judgment and  thought content.   Respiratory: Normal respiratory effort, no problems with respiration noted  Rest of physical exam deferred due to type of encounter  Labs and Imaging No results found for this or any previous visit (from the past 336 hour(s)). US OB LESS THAN 14 WEEKS WITH OB TRANSVAGINAL  Result Date: 02/21/2023 CLINICAL DATA:  Follow-up threatened miscarriage. The patient reports vaginal bleeding several weeks ago. EXAM: OBSTETRIC <14 WK [place] AND TRANSVAGINAL OB US TECHNIQUE: Both transabdominal and transvaginal ultrasound examinations were performed for complete evaluation of the gestation as well as the maternal uterus, adnexal regions, and pelvic cul-de-sac. Transvaginal technique was performed to assess early pregnancy. COMPARISON:  02/02/2023 FINDINGS: Intrauterine gestational sac: Irregular fluid collection in the endometrium with some internal debris. This could represent a small amount of hemorrhage in the endometrial cavity or a small, irregular gestational sac. Yolk sac:  Not visualized Embryo:  Not visualized MSD: 9.1 mm, previously 15.5 mm   5 w   5 d Subchorionic hemorrhage:  None visualized. Maternal uterus/adnexae: Unremarkable maternal ovaries. No free peritoneal fluid. IMPRESSION: 1. Findings compatible with miscarriage with a small amount of hemorrhage in the endometrial cavity or a small, irregular gestational sac. 2. Otherwise, unremarkable examination. These results will be called to the ordering clinician or representative by the Radiologist Assistant, and communication documented in the PACS or [employer]. Electronically Signed   By: Beckie Salts M.D.   On: 02/21/2023 15:24   US OB LESS THAN 14 WEEKS WITH  OB TRANSVAGINAL  Result Date: 02/02/2023 CLINICAL DATA:  Vaginal bleeding EXAM: OBSTETRIC <14 WK [place] AND TRANSVAGINAL OB US TECHNIQUE: Both transabdominal and transvaginal ultrasound examinations were performed for complete evaluation of the gestation as well as the  maternal uterus, adnexal regions, and pelvic cul-de-sac. Transvaginal technique was performed to assess early pregnancy. COMPARISON:  Ob ultrasound 11/07/2020 FINDINGS: Intrauterine gestational sac: Single intrauterine gestational sac Yolk sac:  Visualized Embryo:  Not visualized MSD: 15.5 mm   6 w   3 d Maternal uterus/adnexae: Ovaries are within normal limits. Left ovary measures 3.8 x 1.4 by 3.4 cm. Right ovary measures 3.5 x 2.2 by 3.3 cm. Exophytic left fundal fibroid measuring 2.3 x 2.3 x 2.8 cm. No significant free fluid. IMPRESSION: Single intrauterine gestation with visible gestational sac and yolk sac but no embryo. Consider follow-up ultrasound in 10-14 days to confirm viability. Uterine fibroids. Electronically Signed   By: Jasmine Pang M.D.   On: 02/02/2023 22:01       Assessment and Plan:     1. SAB (spontaneous abortion) Confirm that approximately 20% of patients have a miscarriage during this age range.  There is nothing she did to cause this or that she could have done to prevent this.  Follow-up if no menses in 8 weeks.  Will have patient follow-up for annual exam and Pap.       I discussed the assessment and treatment plan with the patient. The patient was provided an opportunity to ask questions and all were answered. The patient agreed with the plan and demonstrated an understanding of the instructions.   The patient was advised to call back or seek an in-person evaluation/go to the ED if the symptoms worsen or if the condition fails to improve as anticipated.  I provided 10 minutes of face-to-face time during this encounter.   Levie Heritage, DO Center for [employer], [facility]

## 2023-04-09 ENCOUNTER — Ambulatory Visit (INDEPENDENT_AMBULATORY_CARE_PROVIDER_SITE_OTHER): Payer: 59 | Admitting: Family Medicine

## 2023-04-09 ENCOUNTER — Encounter: Payer: Self-pay | Admitting: Family Medicine

## 2023-04-09 VITALS — BP 124/87 | HR 90 | Ht 69.0 in | Wt 168.1 lb

## 2023-04-09 DIAGNOSIS — E559 Vitamin D deficiency, unspecified: Secondary | ICD-10-CM

## 2023-04-09 DIAGNOSIS — E7849 Other hyperlipidemia: Secondary | ICD-10-CM

## 2023-04-09 DIAGNOSIS — I10 Essential (primary) hypertension: Secondary | ICD-10-CM | POA: Diagnosis not present

## 2023-04-09 DIAGNOSIS — E038 Other specified hypothyroidism: Secondary | ICD-10-CM

## 2023-04-09 DIAGNOSIS — R7301 Impaired fasting glucose: Secondary | ICD-10-CM

## 2023-04-09 NOTE — Patient Instructions (Addendum)
I appreciate the opportunity to provide care to you today!    Follow up:  5 months  Labs: please stop by the lab during the week to get your blood drawn (CBC, CMP, TSH, Lipid profile, HgA1c, Vit D)   Attached with your AVS, you will find valuable resources for self-education. I highly recommend dedicating some time to thoroughly examine them.   Please continue to a heart-healthy diet and increase your physical activities. Try to exercise for 30mins at least five days a week.    It was a pleasure to see you and I look forward to continuing to work together on your health and well-being. Please do not hesitate to call the office if you need care or have questions about your care.  In case of emergency, please visit the Emergency Department for urgent care, or contact our clinic at [phone] to schedule an appointment. We're here to help you!   Have a wonderful day and week. With Gratitude, Gloria Zarwolo MSN, FNP-BC  

## 2023-04-09 NOTE — Progress Notes (Signed)
Established Patient Office Visit  Subjective:  Patient ID: Tammy Mcgrath, female    DOB: Dec 12, 1980  Age: 42 y.o. MRN: [id number]  CC:  Chief Complaint  Patient presents with   Follow-up    4 month follow up     HPI Teffany Mcgrath is a 42 y.o. female with past medical history of hypertension presents for f/u of  chronic medical conditions. For the details of today's visit, please refer to the assessment and plan.     Past Medical History:  Diagnosis Date   Chronic hypertension    Depression    not a formal dx, "is sad, not suicidal"   Eczema    Headache    Infection    UTI   Migraines     Past Surgical History:  Procedure Laterality Date   BREAST SURGERY     reduction   DILATION AND CURETTAGE OF UTERUS      Family History  Problem Relation Age of Onset   Other Mother        believes she has problems, but doens't go to dr or talk about it   Hypertension Father    Stroke Father     Social History   Socioeconomic History   Marital status: Single    Spouse name: Not on file   Number of children: Not on file   Years of education: Not on file   Highest education level: Not on file  Occupational History   Not on file  Tobacco Use   Smoking status: Never   Smokeless tobacco: Never  Vaping Use   Vaping status: Never Used  Substance and Sexual Activity   Alcohol use: Not Currently   Drug use: Not Currently    Types: Marijuana    Comment: last was ~3 months ago   Sexual activity: Yes  Other Topics Concern   Not on file  Social History Narrative   Not on file   Social Determinants of Health   Financial Resource Strain: Not on file  Food Insecurity: Not on file  Transportation Needs: Not on file  Physical Activity: Not on file  Stress: Not on file  Social Connections: Not on file  Intimate Partner Violence: Not on file    Outpatient Medications Prior to Visit  Medication Sig Dispense Refill   acetic acid-hydrocortisone (VOSOL-HC) OTIC  solution Place 3 drops into both ears 2 (two) times daily. 10 mL 0   NIFEdipine (PROCARDIA XL) 60 MG 24 hr tablet Take 1 tablet (60 mg total) by mouth daily. 90 tablet 0   sertraline (ZOLOFT) 50 MG tablet TAKE 1 TABLET BY MOUTH EVERY DAY 90 tablet 1   No facility-administered medications prior to visit.    No Known Allergies  ROS Review of Systems  Constitutional:  Negative for chills and fever.  Eyes:  Negative for visual disturbance.  Respiratory:  Negative for chest tightness and shortness of breath.   Neurological:  Negative for dizziness and headaches.      Objective:    Physical Exam HENT:     Head: Normocephalic.     Mouth/Throat:     Mouth: Mucous membranes are moist.  Cardiovascular:     Rate and Rhythm: Normal rate.     Heart sounds: Normal heart sounds.  Pulmonary:     Effort: Pulmonary effort is normal.     Breath sounds: Normal breath sounds.  Neurological:     Mental Status: She is alert.     BP 124/87 (BP  Location: Right Arm, Patient Position: Sitting, Cuff Size: Large)   Pulse 90   Ht 5\' 9"  (1.753 m)   Wt 168 lb 1.9 oz (76.3 kg)   LMP 12/09/2022 (Exact Date)   SpO2 99%   BMI 24.83 kg/m  Wt Readings from Last 3 Encounters:  04/09/23 168 lb 1.9 oz (76.3 kg)  02/02/23 168 lb 12.8 oz (76.6 kg)  12/06/22 171 lb (77.6 kg)    Lab Results  Component Value Date   TSH 1.290 12/06/2022   Lab Results  Component Value Date   WBC 4.8 02/02/2023   HGB 11.4 (L) 02/02/2023   HCT 33.7 (L) 02/02/2023   MCV 79.7 (L) 02/02/2023   PLT 195 02/02/2023   Lab Results  Component Value Date   NA 139 12/06/2022   K 4.2 12/06/2022   CO2 20 12/06/2022   GLUCOSE 90 12/06/2022   BUN 9 12/06/2022   CREATININE 0.73 12/06/2022   BILITOT 0.5 12/06/2022   ALKPHOS 75 12/06/2022   AST 22 12/06/2022   ALT 12 12/06/2022   PROT 7.1 12/06/2022   ALBUMIN 4.2 12/06/2022   CALCIUM 8.7 12/06/2022   ANIONGAP 7 01/19/2022   EGFR 107 12/06/2022   Lab Results  Component  Value Date   CHOL 199 12/06/2022   Lab Results  Component Value Date   HDL 63 12/06/2022   Lab Results  Component Value Date   LDLCALC 122 (H) 12/06/2022   Lab Results  Component Value Date   TRIG 76 12/06/2022   Lab Results  Component Value Date   CHOLHDL 3.2 12/06/2022   Lab Results  Component Value Date   HGBA1C 5.7 (H) 11/20/2022      Assessment & Plan:  Essential hypertension Assessment & Plan: The patient's condition is currently controlled and asymptomatic. She reports inconsistently taking Procardia 60 mg. I recommended that she adhere to a low-sodium diet and increase physical activity. Additionally, she is encouraged to take her blood pressure medication as prescribed BP Readings from Last 3 Encounters:  04/09/23 124/87  02/02/23 (!) 132/91  12/06/22 127/79       IFG (impaired fasting glucose) -     Hemoglobin A1c  Vitamin D deficiency -     VITAMIN D 25 Hydroxy (Vit-D Deficiency, Fractures)  Other specified hypothyroidism -     TSH + free T4  Other hyperlipidemia -     Lipid panel -     CMP14+EGFR -     CBC with Differential/Platelet  Note: This chart has been completed using Engineer, civil (consulting) software, and while attempts have been made to ensure accuracy, certain words and phrases may not be transcribed as intended.    Follow-up: Return in about 5 months (around 09/09/2023).   Gilmore Laroche, FNP

## 2023-04-09 NOTE — Assessment & Plan Note (Signed)
The patient's condition is currently controlled and asymptomatic. She reports inconsistently taking Procardia 60 mg. I recommended that she adhere to a low-sodium diet and increase physical activity. Additionally, she is encouraged to take her blood pressure medication as prescribed BP Readings from Last 3 Encounters:  04/09/23 124/87  02/02/23 (!) 132/91  12/06/22 127/79

## 2023-04-10 ENCOUNTER — Telehealth: Payer: Self-pay | Admitting: Family Medicine

## 2023-04-10 NOTE — Telephone Encounter (Signed)
FMLA  Noted  Copied Sleeved  Original in PCP box Copy front desk folder

## 2023-04-29 DIAGNOSIS — Z0279 Encounter for issue of other medical certificate: Secondary | ICD-10-CM

## 2023-05-01 ENCOUNTER — Ambulatory Visit: Payer: 59 | Admitting: Family Medicine

## 2023-05-25 ENCOUNTER — Other Ambulatory Visit: Payer: Self-pay | Admitting: Family Medicine

## 2023-05-25 DIAGNOSIS — I10 Essential (primary) hypertension: Secondary | ICD-10-CM

## 2023-06-20 ENCOUNTER — Inpatient Hospital Stay (HOSPITAL_COMMUNITY)
Admission: AD | Admit: 2023-06-20 | Discharge: 2023-06-20 | Disposition: A | Discharge status: Home / Self Care | Payer: 59 | Attending: Obstetrics & Gynecology | Admitting: Obstetrics & Gynecology

## 2023-06-20 ENCOUNTER — Inpatient Hospital Stay (HOSPITAL_COMMUNITY): Payer: 59

## 2023-06-20 ENCOUNTER — Other Ambulatory Visit: Payer: Self-pay

## 2023-06-20 ENCOUNTER — Encounter (HOSPITAL_COMMUNITY): Payer: Self-pay | Admitting: *Deleted

## 2023-06-20 ENCOUNTER — Other Ambulatory Visit: Payer: Self-pay | Admitting: Family Medicine

## 2023-06-20 DIAGNOSIS — O09521 Supervision of elderly multigravida, first trimester: Secondary | ICD-10-CM | POA: Insufficient documentation

## 2023-06-20 DIAGNOSIS — O209 Hemorrhage in early pregnancy, unspecified: Secondary | ICD-10-CM | POA: Insufficient documentation

## 2023-06-20 DIAGNOSIS — Z3A01 Less than 8 weeks gestation of pregnancy: Secondary | ICD-10-CM | POA: Diagnosis not present

## 2023-06-20 DIAGNOSIS — O26891 Other specified pregnancy related conditions, first trimester: Secondary | ICD-10-CM | POA: Diagnosis not present

## 2023-06-20 DIAGNOSIS — I10 Essential (primary) hypertension: Secondary | ICD-10-CM

## 2023-06-20 LAB — URINALYSIS, ROUTINE W REFLEX MICROSCOPIC
Bilirubin Urine: NEGATIVE
Glucose, UA: NEGATIVE mg/dL
Hgb urine dipstick: NEGATIVE
Ketones, ur: NEGATIVE mg/dL
Leukocytes,Ua: NEGATIVE
Nitrite: NEGATIVE
Protein, ur: NEGATIVE mg/dL
Specific Gravity, Urine: 1.015 (ref 1.005–1.030)
pH: 5 (ref 5.0–8.0)

## 2023-06-20 LAB — WET PREP, GENITAL
Clue Cells Wet Prep HPF POC: NONE SEEN
Sperm: NONE SEEN
Trich, Wet Prep: NONE SEEN
WBC, Wet Prep HPF POC: >=10 — AB (ref <10)
Yeast Wet Prep HPF POC: NONE SEEN

## 2023-06-20 LAB — HCG, QUANTITATIVE, PREGNANCY: hCG, Beta Chain, Quant, S: 99491 m[IU]/mL — ABNORMAL HIGH (ref <5)

## 2023-06-20 LAB — CBC
HCT: 33.9 % — ABNORMAL LOW (ref 36.0–46.0)
Hemoglobin: 11.4 g/dL — ABNORMAL LOW (ref 12.0–15.0)
MCH: 25.1 pg — ABNORMAL LOW (ref 26.0–34.0)
MCHC: 33.6 g/dL (ref 30.0–36.0)
MCV: 74.5 fL — ABNORMAL LOW (ref 80.0–100.0)
Platelets: 234 10*3/uL (ref 150–400)
RBC: 4.55 MIL/uL (ref 3.87–5.11)
RDW: 17 % — ABNORMAL HIGH (ref 11.5–15.5)
WBC: 5.3 10*3/uL (ref 4.0–10.5)
nRBC: 0 % (ref 0.0–0.2)

## 2023-06-20 LAB — ABO/RH: ABO/RH(D): B POS

## 2023-06-20 LAB — POCT PREGNANCY, URINE: Preg Test, Ur: POSITIVE — AB

## 2023-06-20 MED ORDER — ONDANSETRON HCL 4 MG PO TABS: 4.0000 mg | ORAL_TABLET | Freq: Three times a day (TID) | ORAL | 0 refills | Status: DC | PRN

## 2023-06-20 MED ORDER — NIFEDIPINE ER OSMOTIC RELEASE 60 MG PO TB24
60.0000 mg | ORAL_TABLET | Freq: Two times a day (BID) | ORAL | 0 refills | Status: DC
Start: 2023-06-20 — End: 2023-07-25

## 2023-06-20 MED ORDER — DOXYLAMINE-PYRIDOXINE 10-10 MG PO TBEC: 2.0000 | DELAYED_RELEASE_TABLET | Freq: Every day | ORAL | 0 refills | Status: DC

## 2023-06-20 NOTE — MAU Provider Note (Signed)
History     CSN: [id number]  Arrival date and time: 06/20/23 1332   Event Date/Time   First Provider Initiated Contact with Patient 06/20/2023  2:11 PM   Chief Complaint  Patient presents with   Vaginal Bleeding    HPI  Tammy Mcgrath is a 42 y.o. [id number] at [redacted]w[redacted]d who presents to the MAU for abdominal pain. Started 2 days ago, cramping sensation. Now having pinkish discharge that appeared tissue like. She is worried about an SAB. Desires pregnancy, but difficult timing because she and her partner recently broke up. She denies any fevers, chills, vomiting, c/d, urinary sxs. She endorses nausea, not on meds.  Has hx cHTN -- on Procardia 60mg , was told to increase to BID but had not. No HA, vision changes, CP, SOB, edema. Home BP reported in the 150s/70s.  Past Medical History:  Diagnosis Date   Chronic hypertension    Depression    not a formal dx, "is sad, not suicidal"   Eczema    Headache    Infection    UTI   Migraines     Past Surgical History:  Procedure Laterality Date   BREAST SURGERY     reduction   DILATION AND CURETTAGE OF UTERUS      Family History  Problem Relation Age of Onset   Other Mother        believes she has problems, but doens't go to dr or talk about it   Hypertension Father    Stroke Father     Social History   Tobacco Use   Smoking status: Never   Smokeless tobacco: Never  Vaping Use   Vaping status: Never Used  Substance Use Topics   Alcohol use: Not Currently   Drug use: Not Currently    Types: Marijuana    Comment: last was ~3 months ago    Allergies: No Known Allergies  No medications prior to admission.    ROS reviewed and pertinent positives and negatives as documented in HPI.  Physical Exam   Blood pressure (!) 142/83, pulse 79, temperature 98.2 F (36.8 C), temperature source Oral, resp. rate 18, height 5\' 9"  (1.753 m), weight 75.6 kg, last menstrual period 05/09/2023, SpO2 100%, unknown if currently  breastfeeding.  Physical Exam Constitutional:      General: She is not in acute distress.    Appearance: Normal appearance. She is not ill-appearing.  HENT:     Head: Normocephalic and atraumatic.  Cardiovascular:     Rate and Rhythm: Normal rate.  Pulmonary:     Effort: Pulmonary effort is normal.     Breath sounds: Normal breath sounds.  Abdominal:     Palpations: Abdomen is soft.     Tenderness: There is no abdominal tenderness. There is no guarding.  Musculoskeletal:        General: Normal range of motion.  Skin:    General: Skin is warm and dry.     Findings: No rash.  Neurological:     General: No focal deficit present.     Mental Status: She is alert and oriented to person, place, and time.     MAU Course  Procedures  MDM 42 y.o. [id number] at [redacted]w[redacted]d presenting for VB in early pregnancy. Sxs sound more similar to spotting. HDS, BP noted to be elevated, not taking Procardia as recommended so advised she increase dose as previously advised, discussed importance of tight BP control in pregnancy.  She is Rh positive, HgB ok, negative wet prep.  U/S reveals IUP with +FCA, small subchorionic hemorrhage. Pt advised of results, recommended close f/up for sxs, BP, and discussed return precautions. Stable for d/c.  Assessment and Plan  Vaginal bleeding before [redacted] weeks gestation - Plan: Discharge patient Likely in setting of subchorionic hemorrhage Recommended pelvic rest, given return precautions  Essential hypertension - Plan: NIFEdipine (PROCARDIA XL/NIFEDICAL XL) 60 MG 24 hr tablet, Discharge patient Rec increasing Procardia to 60mg  bid as recommended given home readings high Message sent to [facility] clinic to schedule BP follow up  Sundra Aland, MD OB Fellow, Faculty Practice [facility], Center for [facility]  06/20/2023, 7:36 PM

## 2023-06-20 NOTE — Discharge Instructions (Signed)
Please check your blood pressure at least once daily. If your blood pressure is >140 (top number) or >90 (bottom number) consistently, please call your primary OB group If your blood pressure is >160 (top number) or >110 (bottom number), please return to the maternity assessment unit  FOR NAUSEA AND VOMITING: You can buy Unisom (Doxylamine) 25mg  tablets and Vitamin B6 (usually sold in 100mg  tablets) daily -- take half a tablet of Unisom (12.5mg  dose) and a tablet of Vitamin B6 every night. This would be in place of Diclegis if Diclegis is not covered by your insurance.  **The Unisom may make you drowsy! Please do not take this if you are going to be operating heavy machinery**   Safe Medications in Pregnancy   Acne: Benzoyl Peroxide Salicylic Acid  Backache/Headache: Tylenol: 2 regular strength every 4 hours OR              2 Extra strength every 6 hours  Colds/Coughs/Allergies: Benadryl (alcohol free) 25 mg every 6 hours as needed Breath right strips Claritin Cepacol throat lozenges Chloraseptic throat spray Cold-Eeze- up to three times per day Cough drops, alcohol free Flonase (by prescription only) Guaifenesin Mucinex Robitussin DM (plain only, alcohol free) Saline nasal spray/drops Sudafed (pseudoephedrine) & Actifed ** use only after [redacted] weeks gestation and if you do not have high blood pressure Tylenol Vicks Vaporub Zinc lozenges Zyrtec   Constipation: Colace Ducolax suppositories Fleet enema Glycerin suppositories Metamucil Milk of magnesia Miralax Senokot Smooth move tea  Diarrhea: Kaopectate Imodium A-D  *NO pepto Bismol  Hemorrhoids: Anusol Anusol HC Preparation H Tucks  Indigestion: Tums Maalox Mylanta Cimetidine (Tagamet HB)** preferred in pregnancy Famotidine (Pepcid) Ranitidine (Zantac)  Insomnia: Benadryl (alcohol free) 25mg  every 6 hours as needed Tylenol PM Unisom, no Gelcaps  Leg Cramps: Tums MagGel  Nausea/Vomiting:   Bonine Dramamine Emetrol Ginger extract Sea bands Meclizine   Nausea medication to take during pregnancy:  Unisom (doxylamine succinate 25 mg tablets) Take one tablet daily at bedtime. If symptoms are not adequately controlled, the dose can be increased to a maximum recommended dose of two tablets daily (1/2 tablet in the morning, 1/2 tablet mid-afternoon and one at bedtime). Vitamin B6 100mg  tablets. Take one tablet twice a day (up to 200 mg per day).  Skin Rashes: Aveeno products Benadryl cream or 25mg  every 6 hours as needed Calamine Lotion 1% cortisone cream  Yeast infection: Gyne-lotrimin 7 Monistat 7   **If taking multiple medications, please check labels to avoid duplicating the same active ingredients **take medication as directed on the label ** Do not exceed 4000 mg of tylenol in 24 hours **Do not take medications that contain aspirin or ibuprofen

## 2023-06-20 NOTE — MAU Note (Signed)
.  Tammy Mcgrath is a 42 y.o. at Unknown here in MAU reporting: spotting for 2.5 weeks Reports spotting is "pinkish and looks like flesh, like my insides are coming out." Decided to come in today because she is having headaches, cramping, and nausea. No emesis in the last 24 hours. Able to eat/drink.   LMP: 05/09/2023   Pain score: 2/10 cramping Vitals:   06/20/23 1359  BP: (!) 142/83  Pulse: 79  Resp: 18  Temp: 98.2 F (36.8 C)  SpO2: 100%      Lab orders placed from triage:   POC UPT, UA

## 2023-06-21 LAB — GC/CHLAMYDIA PROBE AMP (~~LOC~~) NOT AT ARMC
Chlamydia: NEGATIVE
Comment: NEGATIVE
Comment: NORMAL
Neisseria Gonorrhea: NEGATIVE

## 2023-06-22 ENCOUNTER — Ambulatory Visit: Payer: 59

## 2023-06-22 VITALS — BP 117/81 | HR 80 | Wt 165.1 lb

## 2023-06-22 DIAGNOSIS — O0991 Supervision of high risk pregnancy, unspecified, first trimester: Secondary | ICD-10-CM

## 2023-06-22 DIAGNOSIS — Z0130 Encounter for examination of blood pressure without abnormal findings: Secondary | ICD-10-CM

## 2023-06-22 NOTE — Progress Notes (Signed)
Subjective:  Tammy Mcgrath is a 42 y.o. female here for BP check.   Hypertension ROS: taking medications as instructed, no medication side effects noted, no TIA's, no chest pain on exertion, no dyspnea on exertion, and no swelling of ankles.    Objective:  BP 117/81   Pulse 80   Wt 165 lb 1.9 oz (74.9 kg)   LMP 05/09/2023   BMI 24.38 kg/m   Appearance alert, well appearing, and in no distress. General exam BP noted to be well controlled today in office.    Assessment:   Blood Pressure well controlled.   Plan:  Current treatment plan is effective, no change in therapy.  Beverly Suriano l Mervin Ramires, CMA

## 2023-06-25 ENCOUNTER — Encounter (HOSPITAL_COMMUNITY): Payer: Self-pay | Admitting: Obstetrics and Gynecology

## 2023-06-25 ENCOUNTER — Inpatient Hospital Stay (HOSPITAL_COMMUNITY)
Admission: AD | Admit: 2023-06-25 | Discharge: 2023-06-25 | Disposition: A | Discharge status: Home / Self Care | Payer: 59 | Attending: Obstetrics and Gynecology | Admitting: Obstetrics and Gynecology

## 2023-06-25 DIAGNOSIS — Z3A01 Less than 8 weeks gestation of pregnancy: Secondary | ICD-10-CM | POA: Insufficient documentation

## 2023-06-25 DIAGNOSIS — O99611 Diseases of the digestive system complicating pregnancy, first trimester: Secondary | ICD-10-CM | POA: Diagnosis not present

## 2023-06-25 DIAGNOSIS — O10911 Unspecified pre-existing hypertension complicating pregnancy, first trimester: Secondary | ICD-10-CM | POA: Insufficient documentation

## 2023-06-25 DIAGNOSIS — O10919 Unspecified pre-existing hypertension complicating pregnancy, unspecified trimester: Secondary | ICD-10-CM

## 2023-06-25 DIAGNOSIS — O09521 Supervision of elderly multigravida, first trimester: Secondary | ICD-10-CM | POA: Diagnosis not present

## 2023-06-25 DIAGNOSIS — K5900 Constipation, unspecified: Secondary | ICD-10-CM | POA: Insufficient documentation

## 2023-06-25 HISTORY — DX: Benign neoplasm of connective and other soft tissue, unspecified: D21.9

## 2023-06-25 MED ORDER — SMOG ENEMA
300.0000 mL | Freq: Once | RECTAL | Status: AC
  Administered 2023-06-25: 200 mL via RECTAL
  Filled 2023-06-25: qty 960

## 2023-06-25 NOTE — MAU Provider Note (Signed)
History     [id number]  Arrival date and time: 06/25/23 0946    Constipation    HPI Tammy Mcgrath is a 42 y.o. at [redacted]w[redacted]d by LMP with PMHx notable for chronic HTN, who presents for constipation.  Normally she has a bowel movement every day; however, she notes that her last regular BM was more than a week ago last Saturday.  She notes that she had a few small rabbit stools yesterday afternoon.  She notes significant discomfort and bleeding when she tries to have a BM.  She did take Colace 2 tablets yesterday, but before that she has not taken anything to help with her symptoms.  She was not sure if she could take an enema and presents today for further evaluation  She has noted some nausea and is taking Zofran, Unisom and B6.  Denies vomiting.  Some pelvic discomfort due to the constipation.  Denies bloating she notes irregular appetite.  She denies vaginal bleeding or discharge.  States she states that the blood is from her rectum when she is trying to have a BM  Patient plans to establish care at [facility] her first appointment is November 20  OB History  Gravida Para Term Preterm AB Living  7 2 2   4 2   SAB IAB Ectopic Multiple Live Births  4     0 2    # Outcome Date GA Lbr Len/2nd Weight Sex Type Anes PTL Lv  7 Current           6 SAB 02/2023          5 SAB 07/14/22 [redacted]w[redacted]d         4 Term 06/22/21 [redacted]w[redacted]d 02:05 / 00:44 3175 g F Vag-Spont EPI  LIV  3 Term 09/10/09    F Vag-Spont   LIV  2 SAB           1 SAB              Past Medical History:  Diagnosis Date   Chronic hypertension    Depression    not a formal dx, "is sad, not suicidal"   Eczema    Fibroid    Headache    Infection    UTI   Migraines     Past Surgical History:  Procedure Laterality Date   BREAST SURGERY     reduction   DILATION AND CURETTAGE OF UTERUS      Family History  Problem Relation Age of Onset   Other Mother        believes she has problems, but doens't go to dr or talk about  it   Hypertension Father    Stroke Father     No Known Allergies  No current facility-administered medications on file prior to encounter.   Current Outpatient Medications on File Prior to Encounter  Medication Sig Dispense Refill   Doxylamine-Pyridoxine 10-10 MG TBEC TAKE 2 TABLETS BY MOUTH AT BEDTIME 60 tablet 0   NIFEdipine (PROCARDIA XL/NIFEDICAL XL) 60 MG 24 hr tablet Take 1 tablet (60 mg total) by mouth 2 (two) times daily. 90 tablet 0   ondansetron (ZOFRAN) 4 MG tablet Take 1 tablet (4 mg total) by mouth every 8 (eight) hours as needed for nausea or vomiting. 20 tablet 0   acetic acid-hydrocortisone (VOSOL-HC) OTIC solution Place 3 drops into both ears 2 (two) times daily. (Patient not taking: Reported on 06/22/2023) 10 mL 0     Review of Systems  Constitutional:  Positive for chills. Negative for diaphoresis, fever, malaise/fatigue and weight loss.  HENT: Negative.    Eyes: Negative.   Respiratory: Negative.    Cardiovascular: Negative.   Gastrointestinal:  Positive for abdominal pain, blood in stool, constipation and nausea. Negative for heartburn and vomiting.  Genitourinary: Negative.   Musculoskeletal: Negative.   Skin: Negative.   Neurological: Negative.   Endo/Heme/Allergies: Negative.   Psychiatric/Behavioral: Negative.     Pertinent positives and negative per HPI, all others reviewed and negative  Physical Exam   BP (!) 127/90 (BP Location: Right Arm)   Pulse 91   Temp 98.4 F (36.9 C) (Oral)   Resp 17   Ht 5\' 9"  (1.753 m)   Wt 76.7 kg   LMP 05/09/2023   SpO2 100%   BMI 24.99 kg/m   Patient Vitals for the past 24 hrs:  BP Temp Temp src Pulse Resp SpO2 Height Weight  06/25/23 0959 (!) 127/90 98.4 F (36.9 C) Oral 91 17 100 % 5\' 9"  (1.753 m) 76.7 kg    Physical Exam Constitutional:      Appearance: Normal appearance.  HENT:     Head: Normocephalic and atraumatic.  Eyes:     Extraocular Movements: Extraocular movements intact.  Cardiovascular:      Rate and Rhythm: Normal rate and regular rhythm.  Pulmonary:     Effort: No respiratory distress.     Breath sounds: Normal breath sounds. No wheezing or rales.  Abdominal:     General: There is no distension.     Palpations: Abdomen is soft. There is no mass.     Tenderness: There is abdominal tenderness. There is no guarding or rebound.  Genitourinary:    Comments: Pt declined exam Musculoskeletal:     Cervical back: Neck supple. No tenderness.  Skin:    General: Skin is warm and dry.  Neurological:     Mental Status: She is alert and oriented to person, place, and time.     Cranial Nerves: No cranial nerve deficit.  Psychiatric:        Mood and Affect: Mood normal.        Behavior: Behavior normal.        Thought Content: Thought content normal.        Judgment: Judgment normal.      Labs No results found for this or any previous visit (from the past 24 hour(s)).  Imaging No results found.  MAU Course   MDM -HPI/exam as above -pt declined manual disimpaction -enema ordered -pt able to have BM  -feeling somewhat better, notes feeling tired from straining -felt comfortable with discharge  Assessment and Plan   1. Constipation during pregnancy in first trimester   2. Chronic hypertension in pregnancy   -discharge pt is stable condition -reviewed OTC remedies including daily colace, increased hydration and fiber -discussed importance of regular BMs and encouraged repeat enema in the future if needed -and reassured pt of medications that are safe in pregnancy -f/u outpt as scheduled  -continue with oral anti-hypertensive, BP normal today  Myna Hidalgo, DO Attending Obstetrician & Gynecologist, [facility] for [employer], [facility]

## 2023-06-25 NOTE — MAU Note (Signed)
Tammy Mcgrath is a 42 y.o. at [redacted]w[redacted]d here in MAU reporting: been trying to poop for a wk.  Only thing coming out is urine and blood (from rectum). feeling lots of pressure down there, is burning, huts when she sits.  Took stool softener/lax x2 yesterday- no results.  Her mom said she was probably impacted and to come up here.  Onset of complaint: ongoing for a wk.  Pain score: 8 Vitals:   06/25/23 0959  BP: (!) 127/90  Pulse: 91  Resp: 17  Temp: 98.4 F (36.9 C)  SpO2: 100%      Lab orders placed from triage:

## 2023-07-04 ENCOUNTER — Ambulatory Visit: Payer: 59 | Admitting: Obstetrics & Gynecology

## 2023-07-04 ENCOUNTER — Other Ambulatory Visit (HOSPITAL_COMMUNITY)
Admission: RE | Admit: 2023-07-04 | Discharge: 2023-07-04 | Disposition: A | Discharge status: Home / Self Care | Payer: 59 | Source: Ambulatory Visit | Attending: Obstetrics & Gynecology | Admitting: Obstetrics & Gynecology

## 2023-07-04 ENCOUNTER — Encounter: Payer: Self-pay | Admitting: Obstetrics & Gynecology

## 2023-07-04 VITALS — BP 125/89 | HR 83 | Wt 169.0 lb

## 2023-07-04 DIAGNOSIS — Z3A09 9 weeks gestation of pregnancy: Secondary | ICD-10-CM | POA: Insufficient documentation

## 2023-07-04 DIAGNOSIS — N898 Other specified noninflammatory disorders of vagina: Secondary | ICD-10-CM

## 2023-07-04 DIAGNOSIS — D219 Benign neoplasm of connective and other soft tissue, unspecified: Secondary | ICD-10-CM | POA: Insufficient documentation

## 2023-07-04 DIAGNOSIS — O0990 Supervision of high risk pregnancy, unspecified, unspecified trimester: Secondary | ICD-10-CM

## 2023-07-04 DIAGNOSIS — O23591 Infection of other part of genital tract in pregnancy, first trimester: Secondary | ICD-10-CM

## 2023-07-04 DIAGNOSIS — O10911 Unspecified pre-existing hypertension complicating pregnancy, first trimester: Secondary | ICD-10-CM | POA: Diagnosis not present

## 2023-07-04 DIAGNOSIS — O09521 Supervision of elderly multigravida, first trimester: Secondary | ICD-10-CM | POA: Diagnosis not present

## 2023-07-04 DIAGNOSIS — O10919 Unspecified pre-existing hypertension complicating pregnancy, unspecified trimester: Secondary | ICD-10-CM | POA: Insufficient documentation

## 2023-07-04 DIAGNOSIS — O09529 Supervision of elderly multigravida, unspecified trimester: Secondary | ICD-10-CM

## 2023-07-04 DIAGNOSIS — O219 Vomiting of pregnancy, unspecified: Secondary | ICD-10-CM

## 2023-07-04 DIAGNOSIS — O0991 Supervision of high risk pregnancy, unspecified, first trimester: Secondary | ICD-10-CM

## 2023-07-04 MED ORDER — SCOPOLAMINE 1 MG/3DAYS TD PT72SCOPOLAMINE 1 MG/3DAYS
1.0000 | MEDICATED_PATCH | TRANSDERMAL | 12 refills | Status: DC
Start: 2023-07-04 — End: 2023-08-30

## 2023-07-04 MED ORDER — ASPIRIN 81 MG PO TBEC: 162.0000 mg | DELAYED_RELEASE_TABLET | Freq: Every day | ORAL | 2 refills | Status: DC

## 2023-07-04 MED ORDER — ONDANSETRON 4 MG PO TBDP: 4.0000 mg | ORAL_TABLET | Freq: Four times a day (QID) | ORAL | 0 refills | Status: DC | PRN

## 2023-07-04 MED ORDER — PROMETHAZINE HCL 25 MG PO TABS: 25.0000 mg | ORAL_TABLET | Freq: Four times a day (QID) | ORAL | 2 refills | Status: DC | PRN

## 2023-07-04 NOTE — Progress Notes (Signed)
Addendum  EDC updated, dated by 7 week scan not consistent with LMP.   Jaynie Collins, MD

## 2023-07-04 NOTE — Addendum Note (Signed)
Addended by: Jaynie Collins A on: 07/04/2023 12:10 PM   Modules accepted: Level of Service

## 2023-07-04 NOTE — Progress Notes (Signed)
History:   Tammy Mcgrath is a 42 y.o. [id number] at [redacted]w[redacted]d by early ultrasound being seen today for her first obstetrical visit.  Accompanied by her FOB and child. Her obstetrical history is significant for  two term SVDs, and 4 SABs . History of CHTN on Nifedipine.  Pregnancy history fully reviewed.  Patient reports nausea and vomiting that is moderate-severe, desires medication.     HISTORY: OB History  Gravida Para Term Preterm AB Living  7 2 2  0 4 2  SAB IAB Ectopic Multiple Live Births  4 0 0 0 2    # Outcome Date GA Lbr Len/2nd Weight Sex Type Anes PTL Lv  7 Current           6 SAB 02/2023          5 SAB 07/14/22 [redacted]w[redacted]d         4 Term 06/22/21 [redacted]w[redacted]d 02:05 / 00:44 7 lb (3.175 kg) F Vag-Spont EPI  LIV     Name: Rosene,GIRL Novalee     Apgar1: 8  Apgar5: 9  3 Term 09/10/09    F Vag-Spont   LIV  2 SAB           1 SAB             Last pap smear was done years ago and was normal  Past Medical History:  Diagnosis Date   Chronic hypertension    Depression    not a formal dx, "is sad, not suicidal"   Eczema    Fibroid    Headache    Migraines    UTI (urinary tract infection)    Past Surgical History:  Procedure Laterality Date   BREAST SURGERY     reduction   DILATION AND CURETTAGE OF UTERUS     Family History  Problem Relation Age of Onset   Other Mother        believes she has problems, but doens't go to dr or talk about it   Hypertension Father    Stroke Father    Social History   Tobacco Use   Smoking status: Never   Smokeless tobacco: Never  Vaping Use   Vaping status: Never Used  Substance Use Topics   Alcohol use: Not Currently   Drug use: Not Currently    Types: Marijuana    Comment: last was ~3 months ago   No Known Allergies Current Outpatient Medications on File Prior to Visit  Medication Sig Dispense Refill   Doxylamine-Pyridoxine 10-10 MG TBEC TAKE 2 TABLETS BY MOUTH AT BEDTIME 60 tablet 0   NIFEdipine (PROCARDIA XL/NIFEDICAL XL) 60 MG 24  hr tablet Take 1 tablet (60 mg total) by mouth 2 (two) times daily. 90 tablet 0   No current facility-administered medications on file prior to visit.    Review of Systems Pertinent items noted in HPI and remainder of comprehensive ROS otherwise negative.   Physical Exam:   Vitals:   07/04/23 1429  BP: 125/89  Pulse: 83  Weight: 169 lb (76.7 kg)    Bedside Ultrasound for FHR check: Viable intrauterine pregnancy with positive cardiac activity noted, fetal heart rate 160 bpm Patient informed that the ultrasound is considered a limited obstetric ultrasound and is not intended to be a complete ultrasound exam.  Patient also informed that the ultrasound is not being completed with the intent of assessing for fetal or placental anomalies or any pelvic abnormalities.  Explained that the purpose of today's ultrasound is to  assess for fetal heart rate.  Patient acknowledges the purpose of the exam and the limitations of the study. General: well-developed, well-nourished female in no acute distress  Breasts:  normal appearance, no masses or tenderness bilaterally, exam done in the presence of a chaperone.   Skin: normal coloration and turgor, no rashes  Neurologic: oriented, normal, negative, normal mood  Extremities: normal strength, tone, and muscle mass, ROM of all joints is normal  HEENT PERRLA, extraocular movement intact and sclera clear, anicteric  Neck supple and no masses  Cardiovascular: regular rate and rhythm  Respiratory:  no respiratory distress, normal breath sounds  Abdomen: soft, non-tender; bowel sounds normal; no masses,  no organomegaly  Pelvic: normal external genitalia, no lesions, normal vaginal mucosa, normal vaginal discharge, normal cervix, pap smear done. Exam done in the presence of a chaperone.     Assessment:    Pregnancy: [id number] Patient Active Problem List   Diagnosis Date Noted   Fibroids 07/04/2023   Otitis externa of both ears 12/07/2022   Depression,  recurrent (HCC) 10/16/2022   Atopic dermatitis 04/11/2022   Chronic migraine without aura, with intractable migraine, so stated, with status migrainosus 03/26/2022   Hearing loss 03/26/2022   Essential hypertension 01/22/2022   Chronic hypertension in pregnancy 05/13/2021   AMA (advanced maternal age) multigravida 40+ 05/12/2021   Supervision of high-risk pregnancy 05/12/2021     Plan:    1. Chronic hypertension in pregnancy [x]  Baseline labs (CBC, CMP, urine Pr:Cr,TSH, HgA1C) ordered [x]  Aspirin 162 mg daily prescribed after 12 weeks [ ]  Serial growth scans 20-24-28-32-35-38 [ ]  Antenatal testing starting at 32 weeks [ ]  Delivery by 39 weeks (with meds, 40 with no meds) or earlier if needed Discussed implications of CHTN in pregnancy, need for antenatal testing and frequent ultrasounds/prenatal visits, need for optimizing BP control to decrease CHTN/preeclampsia associated maternal-fetal morbidity and mortality.  Referred to Cardio Obstetrics. - CBC/D/Plt+RPR+Rh+ABO+RubIgG... - Culture, OB Urine - [place] MFM OB DETAIL +14 WK; Future - Protein / creatinine ratio, urine - Hemoglobin A1c - Comprehensive metabolic panel - Cytology - PAP( [facility]) - CHL AMB BABYSCRIPTS OPT IN - TSH Rfx on Abnormal to Free T4 - aspirin EC 81 MG tablet; Take 2 tablets (162 mg total) by mouth at bedtime. Start taking when you are [redacted] weeks pregnant for rest of pregnancy for prevention of preeclampsia  Dispense: 300 tablet; Refill: 2 - AMB Referral to Cardio Obstetrics  2. Nausea/vomiting in pregnancy Prescribed antimemetics, will monitor response. - ondansetron (ZOFRAN-ODT) 4 MG disintegrating tablet; Take 1 tablet (4 mg total) by mouth every 6 (six) hours as needed for nausea.  Dispense: 20 tablet; Refill: 0 - promethazine (PHENERGAN) 25 MG tablet; Take 1 tablet (25 mg total) by mouth every 6 (six) hours as needed for nausea or vomiting.  Dispense: 30 tablet; Refill: 2 - scopolamine (TRANSDERM-SCOP) 1  MG/3DAYS; Place 1 patch (1.5 mg total) onto the skin every 3 (three) days.  Dispense: 10 patch; Refill: 12  3. Antepartum multigravida of advanced maternal age NIPS and detailed anatomy scan ordered. - [place] MFM OB DETAIL +14 WK; Future - PANORAMA PRENATAL TEST AND HORIZON BASIC  4. [redacted] weeks gestation of pregnancy 5. Supervision of high risk pregnancy, antepartum - CBC/D/Plt+RPR+Rh+ABO+RubIgG... - Culture, OB Urine - [place] MFM OB DETAIL +14 WK; Future - Protein / creatinine ratio, urine - Hemoglobin A1c - Comprehensive metabolic panel - Cytology - PAP( [place]) - CHL AMB BABYSCRIPTS OPT IN - TSH Rfx on Abnormal  to Free T4 - PANORAMA PRENATAL TEST AND HORIZON BASIC  Initial labs drawn. Continue prenatal vitamins. Problem list reviewed and updated. Genetic Screening discussed, Panorama and Horizon: ordered. Ultrasound discussed; fetal anatomic survey: scheduled. Anticipatory guidance about prenatal visits given including labs, ultrasounds, and testing. Weight gain recommendations per IOM guidelines reviewed: underweight/BMI 18.5 or less > 28 - 40 lbs; normal weight/BMI 18.5 - 24.9 > 25 - 35 lbs; overweight/BMI 25 - 29.9 > 15 - 25 lbs; obese/BMI  30 or more > 11 - 20 lbs. Discussed usage of the Babyscripts app for more information about pregnancy, and to track blood pressures. Also discussed usage of virtual visits as additional source of managing and completing prenatal visits.  Patient was encouraged to use MyChart to review results, send requests, and have questions addressed.   The nature of Center for [facility]/Faculty Practice with multiple MDs and Advanced Practice Providers was explained to patient; also emphasized that residents, students are part of our team. Routine obstetric precautions reviewed. Encouraged to seek out care at our office or emergency room [facility] MAU preferred) for urgent and/or emergent concerns. Return in about 4 weeks (around 08/01/2023) for OFFICE OB  VISIT (MD only), ?NIPS/Horizon.     Jaynie Collins, MD, FACOG Obstetrician & Gynecologist, [facility] for [employer], [facility]

## 2023-07-05 LAB — CBC/D/PLT+RPR+RH+ABO+RUBIGG...
Antibody Screen: NEGATIVE
Basophils Absolute: 0.1 10*3/uL (ref 0.0–0.2)
Basos: 1 %
EOS (ABSOLUTE): 0.2 10*3/uL (ref 0.0–0.4)
Eos: 3 %
HCV Ab: NONREACTIVE
HIV Screen 4th Generation wRfx: NONREACTIVE
Hematocrit: 39.2 % (ref 34.0–46.6)
Hemoglobin: 12.3 g/dL (ref 11.1–15.9)
Hepatitis B Surface Ag: NEGATIVE
Immature Grans (Abs): 0 10*3/uL (ref 0.0–0.1)
Immature Granulocytes: 0 %
Lymphocytes Absolute: 1.5 10*3/uL (ref 0.7–3.1)
Lymphs: 29 %
MCH: 25.5 pg — ABNORMAL LOW (ref 26.6–33.0)
MCHC: 31.4 g/dL — ABNORMAL LOW (ref 31.5–35.7)
MCV: 81 fL (ref 79–97)
Monocytes Absolute: 0.4 10*3/uL (ref 0.1–0.9)
Monocytes: 8 %
Neutrophils Absolute: 3.1 10*3/uL (ref 1.4–7.0)
Neutrophils: 59 %
Platelets: 259 10*3/uL (ref 150–450)
RBC: 4.82 x10E6/uL (ref 3.77–5.28)
RDW: 18.6 % — ABNORMAL HIGH (ref 11.7–15.4)
RPR Ser Ql: NONREACTIVE
Rh Factor: POSITIVE
Rubella Antibodies, IGG: 5.05 {index} (ref >0.99)
WBC: 5.2 10*3/uL (ref 3.4–10.8)

## 2023-07-05 LAB — COMPREHENSIVE METABOLIC PANEL
ALT: 8 [IU]/L (ref 0–32)
AST: 15 [IU]/L (ref 0–40)
Albumin: 4.2 g/dL (ref 3.9–4.9)
Alkaline Phosphatase: 69 [IU]/L (ref 44–121)
BUN/Creatinine Ratio: 11 (ref 9–23)
BUN: 7 mg/dL (ref 6–24)
Bilirubin Total: 0.5 mg/dL (ref 0.0–1.2)
CO2: 24 mmol/L (ref 20–29)
Calcium: 9.1 mg/dL (ref 8.7–10.2)
Chloride: 100 mmol/L (ref 96–106)
Creatinine, Ser: 0.63 mg/dL (ref 0.57–1.00)
Globulin, Total: 2.9 g/dL (ref 1.5–4.5)
Glucose: 93 mg/dL (ref 70–99)
Potassium: 4.2 mmol/L (ref 3.5–5.2)
Sodium: 133 mmol/L — ABNORMAL LOW (ref 134–144)
Total Protein: 7.1 g/dL (ref 6.0–8.5)
eGFR: 115 mL/min/{1.73_m2} (ref >59)

## 2023-07-05 LAB — HEMOGLOBIN A1C
Est. average glucose Bld gHb Est-mCnc: 117 mg/dL
Hgb A1c MFr Bld: 5.7 % — ABNORMAL HIGH (ref 4.8–5.6)

## 2023-07-05 LAB — HCV INTERPRETATION

## 2023-07-05 LAB — TSH RFX ON ABNORMAL TO FREE T4: TSH: 1.92 u[IU]/mL (ref 0.450–4.500)

## 2023-07-05 NOTE — Addendum Note (Signed)
Addended by: Mikey Bussing on: 07/05/2023 08:19 AM   Modules accepted: Orders

## 2023-07-06 LAB — PROTEIN / CREATININE RATIO, URINE
Creatinine, Urine: 81.2 mg/dL
Protein, Ur: <4 mg/dL

## 2023-07-06 LAB — CERVICOVAGINAL ANCILLARY ONLY
Bacterial Vaginitis (gardnerella): POSITIVE — AB
Candida Glabrata: NEGATIVE
Candida Vaginitis: POSITIVE — AB
Comment: NEGATIVE
Comment: NEGATIVE
Comment: NEGATIVE

## 2023-07-06 MED ORDER — METRONIDAZOLE 500 MG PO TABS: 500.0000 mg | ORAL_TABLET | Freq: Two times a day (BID) | ORAL | 0 refills | Status: AC

## 2023-07-06 MED ORDER — TERCONAZOLE 0.4 % VA CREA: 1.0000 | TOPICAL_CREAM | Freq: Every day | VAGINAL | 0 refills | Status: DC

## 2023-07-06 NOTE — Addendum Note (Signed)
Addended by: Jaynie Collins A on: 07/06/2023 08:46 PM   Modules accepted: Orders

## 2023-07-08 LAB — CULTURE, OB URINE

## 2023-07-08 LAB — URINE CULTURE, OB REFLEX

## 2023-07-10 LAB — CYTOLOGY - PAP
Chlamydia: NEGATIVE
Comment: NEGATIVE
Comment: NEGATIVE
Comment: NORMAL
Diagnosis: NEGATIVE
Diagnosis: REACTIVE
High risk HPV: NEGATIVE
Neisseria Gonorrhea: NEGATIVE

## 2023-07-11 LAB — PANORAMA PRENATAL TEST FULL PANEL:PANORAMA TEST PLUS 5 ADDITIONAL MICRODELETIONS: FETAL FRACTION: 3.8

## 2023-07-17 ENCOUNTER — Encounter: Payer: Self-pay | Admitting: Obstetrics & Gynecology

## 2023-07-17 DIAGNOSIS — Z148 Genetic carrier of other disease: Secondary | ICD-10-CM | POA: Insufficient documentation

## 2023-07-17 LAB — HORIZON CUSTOM: REPORT SUMMARY: POSITIVE — AB

## 2023-07-23 ENCOUNTER — Telehealth: Payer: Self-pay

## 2023-07-23 NOTE — Telephone Encounter (Signed)
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 Unable to leave vm. Mychart message was sent to the patient.  Tammy Mcgrath l Deshone Lyssy, CMA

## 2023-07-23 NOTE — Telephone Encounter (Signed)
-----   Message from Jaynie Collins sent at 07/19/2023  6:03 AM EST ----- Regarding: RE: Sickle Cell testing You could offer it but she really needs FOB testing more, they will recommend this too ----- Message ----- From: Mikey Bussing, CMA Sent: 07/18/2023   3:18 PM EST To: Tereso Newcomer, MD Subject: RE: Sickle Cell testing                        I forgot to ask, will the patient need a referral to Genetic Counseling? ----- Message ----- From: Tereso Newcomer, MD Sent: 07/18/2023   7:12 AM EST To: Mikey Bussing, CMA Subject: RE: Sickle Cell testing                        Sure. Or could get [place] partner testing. ----- Message ----- From: Mikey Bussing, CMA Sent: 07/17/2023   4:47 PM EST To: Tereso Newcomer, MD Subject: Sickle Cell testing                            Should we recommend partner testing at the [facility] Department?

## 2023-07-23 NOTE — Telephone Encounter (Signed)
Patient called the office back and was made aware that she is a carrier for sickle cell and spinal muscular atrophy. Offered partner testing for sickle cell. Patient states she will let us know what her partner decides. Understanding was voiced. Lyndall Windt l Daeshawn Redmann, CMA

## 2023-07-25 ENCOUNTER — Other Ambulatory Visit: Payer: Self-pay

## 2023-07-25 DIAGNOSIS — I10 Essential (primary) hypertension: Secondary | ICD-10-CM

## 2023-07-25 MED ORDER — NIFEDIPINE ER OSMOTIC RELEASE 60 MG PO TB24: 60.0000 mg | ORAL_TABLET | Freq: Two times a day (BID) | ORAL | 0 refills | Status: DC

## 2023-07-25 NOTE — Progress Notes (Signed)
Patient sent Mychart message requesting a refill of Nifedipine. Nifedipine 60 mg take 1 tablet BID was sent to the patient's pharmacy. A Mychart message was sent to the patient. Neno Hohensee l Larance Ratledge, CMA

## 2023-08-03 ENCOUNTER — Ambulatory Visit (INDEPENDENT_AMBULATORY_CARE_PROVIDER_SITE_OTHER): Payer: 59 | Admitting: Obstetrics and Gynecology

## 2023-08-03 VITALS — BP 119/80 | HR 94 | Wt 172.0 lb

## 2023-08-03 DIAGNOSIS — O10919 Unspecified pre-existing hypertension complicating pregnancy, unspecified trimester: Secondary | ICD-10-CM

## 2023-08-03 DIAGNOSIS — O99019 Anemia complicating pregnancy, unspecified trimester: Secondary | ICD-10-CM

## 2023-08-03 DIAGNOSIS — D573 Sickle-cell trait: Secondary | ICD-10-CM

## 2023-08-03 DIAGNOSIS — O10012 Pre-existing essential hypertension complicating pregnancy, second trimester: Secondary | ICD-10-CM

## 2023-08-03 DIAGNOSIS — Z3A14 14 weeks gestation of pregnancy: Secondary | ICD-10-CM

## 2023-08-03 DIAGNOSIS — Z148 Genetic carrier of other disease: Secondary | ICD-10-CM

## 2023-08-03 DIAGNOSIS — O99012 Anemia complicating pregnancy, second trimester: Secondary | ICD-10-CM

## 2023-08-03 NOTE — Progress Notes (Signed)
   PRENATAL VISIT NOTE  Subjective:  Tammy Mcgrath is a 42 y.o. [id number] at [redacted]w[redacted]d being seen today for ongoing prenatal care.  She is currently monitored for the following issues for this high-risk pregnancy and has AMA (advanced maternal age) multigravida 38+; Supervision of high-risk pregnancy; Chronic hypertension in pregnancy; Essential hypertension; Chronic migraine without aura, with intractable migraine, so stated, with status migrainosus; Hearing loss; Atopic dermatitis; Depression, recurrent (HCC); Otitis externa of both ears; Fibroids; and Carrier of genetic disorder on their problem list.  Patient reports no complaints.  Contractions: Not present. Vag. Bleeding: None.  Movement: Absent. Denies leaking of fluid.   Concerns about SMA - was told that baby has the disease.   The following portions of the patient's history were reviewed and updated as appropriate: allergies, current medications, past family history, past medical history, past social history, past surgical history and problem list.   Objective:   Vitals:   08/03/23 1117  BP: 119/80  Pulse: 94  Weight: 172 lb (78 kg)    Fetal Status: Fetal Heart Rate (bpm): 159   Movement: Absent     General:  Alert, oriented and cooperative. Patient is in no acute distress.  Skin: Skin is warm and dry. No rash noted.   Cardiovascular: Normal heart rate noted  Respiratory: Normal respiratory effort, no problems with respiration noted  Abdomen: Soft, gravid, appropriate for gestational age.  Pain/Pressure: Present     Pelvic: Cervical exam deferred        Extremities: Normal range of motion.  Edema: None  Mental Status: Normal mood and affect. Normal behavior. Normal judgment and thought content.   Assessment and Plan:  Pregnancy: [id number] at [redacted]w[redacted]d 1. [redacted] weeks gestation of pregnancy (Primary) FHR wnl Recommend early GTT due to elevated A1c  2. Chronic hypertension in pregnancy Taking ASA BP wnl today  3. Carrier of  spinal muscular atrophy Partner testing today. Noted that detailed anatomy scan will also look for any abnormalities that raise suspicion of baby being affected and not just a carrier  4. Sickle cell trait in mother affecting pregnancy [facility]) Both parents are carriers, prior children are carriers  Noted that this is also part of universal newborn screening   Preterm labor symptoms and general obstetric precautions including but not limited to vaginal bleeding, contractions, leaking of fluid and fetal movement were reviewed in detail with the patient. Please refer to After Visit Summary for other counseling recommendations.   No follow-ups on file.  Future Appointments  Date Time Provider Department Center  08/24/2023  1:40 PM Thomasene Ripple, DO [facility] None  08/30/2023 11:15 AM Levie Heritage, DO [facility] None  09/07/2023  8:15 AM [facility] NURSE [facility]  09/07/2023  8:30 AM [facility] US5 [facility]  09/10/2023 10:00 AM Gilmore Laroche, FNP [facility]    Lorriane Shire, MD

## 2023-08-15 NOTE — L&D Delivery Note (Signed)
 OB/GYN Faculty Practice Delivery Note  Tammy Mcgrath is a 43 y.o. [id number] s/p SVD at [redacted]w[redacted]d. She was admitted for induction of labor for chronic hypertension.   ROM: 0h 33m with clear fluid GBS Status: PRESUMPTIVE NEGATIVE/-- (05/30 0659) Maximum Maternal Temperature: 99.75F   Labor Progress: Initial SVE: 2/50/-3. She was given Cytotec  and Pitocin  and AROM was performed for her induction. She then progressed to complete. Prior to my arrival into the delivery room, bedside RN noted palm sized clot present.  Delivery Date/Time: 01/11/24 1647 Delivery: Called to room and patient was complete and pushing. Head delivered ROA. A loose nuchal was reduced at the perineum. Shoulder and body delivered in usual fashion. Infant with spontaneous cry, placed on mother's abdomen, dried and stimulated. Cord clamped x 2 after 1-minute delay, and cut by father of baby, Zavere. Cord blood drawn. Following delivery of infant, there was several large clots and brisk bleeding noted. Placenta delivered quickly with gentle cord traction. Fundus firm with massage and Pitocin . TXA administered. Labia, perineum, vagina, and cervix inspected inspected with a first degree laceration noted which was repaired in the usual fashion. Her uterus was boggy between fundal rubs, so fundal massage performed and bladder emptied with in-and-out catherization w removal of 50cc clear urine. Despite these measures, she continued to have bogginess so JADA was placed per manufacturer protocol and rectal Cytotec  was administered. Will leave JADA in place x 2 hours then reassess prior to removal. A foley catheter was also placed in bladder.  Baby Weight: pending  Placenta: Sent to pathology Complications: Suspected placental abruption, postpartum uterine atony Lacerations: First degree, repaired EBL: 461 mL Analgesia: Epidural   Infant:  APGAR (1 MIN): 7  APGAR (5 MINS): 8  [name], MD OB Fellow, Faculty Practice [facility], Center for [facility]

## 2023-08-24 ENCOUNTER — Ambulatory Visit (INDEPENDENT_AMBULATORY_CARE_PROVIDER_SITE_OTHER): Payer: 59 | Admitting: Cardiology

## 2023-08-24 ENCOUNTER — Encounter: Payer: Self-pay | Admitting: Cardiology

## 2023-08-24 VITALS — BP 100/70 | HR 76 | Ht 69.0 in | Wt 174.6 lb

## 2023-08-24 DIAGNOSIS — R0789 Other chest pain: Secondary | ICD-10-CM | POA: Diagnosis not present

## 2023-08-24 DIAGNOSIS — O09522 Supervision of elderly multigravida, second trimester: Secondary | ICD-10-CM

## 2023-08-24 DIAGNOSIS — R0602 Shortness of breath: Secondary | ICD-10-CM

## 2023-08-24 DIAGNOSIS — I10 Essential (primary) hypertension: Secondary | ICD-10-CM

## 2023-08-24 DIAGNOSIS — Z3A17 17 weeks gestation of pregnancy: Secondary | ICD-10-CM

## 2023-08-24 DIAGNOSIS — O10919 Unspecified pre-existing hypertension complicating pregnancy, unspecified trimester: Secondary | ICD-10-CM | POA: Diagnosis not present

## 2023-08-24 NOTE — Progress Notes (Signed)
 Cardio-Obstetrics Clinic  New Evaluation  Date:  08/24/2023   ID:  Ashland, Osmer 1980-12-25, MRN [id number]  PCP:  Zarwolo, Gloria, FNP   [facility] Providers Cardiologist:  Kryssa Risenhoover, DO  Electrophysiologist:  None       Referring MD: Herchel Gloris LABOR, MD   Chief Complaint:  I am doing ok   History of Present Illness:    Tammy Mcgrath is a 43 y.o. female [G7P2042] who is being seen today for the evaluation of chronic hypertension in pregnancy at the request of Anyanwu, Ugonna A, MD.   She is currently [redacted] weeks pregnant with a history of hypertension diagnosed during her last pregnancy.  The patient reports feeling unwell and has been experiencing chest pain and shortness of breath, particularly during stressful situations at work. She has since stopped working, which has led to a significant reduction in these symptoms. The patient also has a hearing disability, which contributes to her stress levels, particularly when she is required to communicate verbally.   Prior CV Studies Reviewed: The following studies were reviewed today:   Past Medical History:  Diagnosis Date   Chronic hypertension    Depression    not a formal dx, is sad, not suicidal   Eczema    Fibroid    Headache    Migraines    Sickle cell trait (HCC)    UTI (urinary tract infection)     Past Surgical History:  Procedure Laterality Date   BREAST SURGERY     reduction   DILATION AND CURETTAGE OF UTERUS        OB History     Gravida  7   Para  2   Term  2   Preterm      AB  4   Living  2      SAB  4   IAB      Ectopic      Multiple  0   Live Births  2               Current Medications: Current Meds  Medication Sig   aspirin  EC 81 MG tablet Take 2 tablets (162 mg total) by mouth at bedtime. Start taking when you are [redacted] weeks pregnant for rest of pregnancy for prevention of preeclampsia   NIFEdipine  (PROCARDIA  XL/NIFEDICAL XL) 60 MG 24 hr  tablet Take 1 tablet (60 mg total) by mouth 2 (two) times daily.   terconazole  (TERAZOL 7 ) 0.4 % vaginal cream Place 1 applicator vaginally at bedtime. Use for seven days     Allergies:   Patient has no known allergies.   Social History   Socioeconomic History   Marital status: Single    Spouse name: Not on file   Number of children: Not on file   Years of education: Not on file   Highest education level: Not on file  Occupational History   Not on file  Tobacco Use   Smoking status: Never   Smokeless tobacco: Never  Vaping Use   Vaping status: Never Used  Substance and Sexual Activity   Alcohol use: Not Currently   Drug use: Not Currently    Types: Marijuana    Comment: last was ~3 months ago   Sexual activity: Not Currently  Other Topics Concern   Not on file  Social History Narrative   Not on file   Social Drivers of Health   Financial Resource Strain: Not on file  Food Insecurity: No  Food Insecurity (08/24/2023)   Hunger Vital Sign    Worried About Running Out of Food in the Last Year: Never true    Ran Out of Food in the Last Year: Never true  Transportation Needs: No Transportation Needs (08/24/2023)   PRAPARE - Administrator, Civil Service (Medical): No    Lack of Transportation (Non-Medical): No  Physical Activity: Not on file  Stress: Not on file  Social Connections: Not on file      Family History  Problem Relation Age of Onset   Other Mother        believes she has problems, but doens't go to dr or talk about it   Hypertension Father    Stroke Father       ROS:   Please see the history of present illness.     All other systems reviewed and are negative.   Labs/EKG Reviewed:    EKG:   None today   Recent Labs: 07/04/2023: ALT 8; BUN 7; Creatinine, Ser 0.63; Hemoglobin 12.3; Platelets 259; Potassium 4.2; Sodium 133; TSH 1.920   Recent Lipid Panel Lab Results  Component Value Date/Time   CHOL 199 12/06/2022 11:30 AM   TRIG 76  12/06/2022 11:30 AM   HDL 63 12/06/2022 11:30 AM   CHOLHDL 3.2 12/06/2022 11:30 AM   LDLCALC 122 (H) 12/06/2022 11:30 AM    Physical Exam:    VS:  BP 100/70 (BP Location: Left Arm, Patient Position: Sitting, Cuff Size: Normal)   Pulse 76   Ht 5' 9 (1.753 m)   Wt 174 lb 9.6 oz (79.2 kg)   LMP 05/09/2023   BMI 25.78 kg/m     Wt Readings from Last 3 Encounters:  08/24/23 174 lb 9.6 oz (79.2 kg)  08/03/23 172 lb (78 kg)  07/04/23 169 lb (76.7 kg)     GEN:  Well nourished, well developed in no acute distress HEENT: Normal NECK: No JVD; No carotid bruits LYMPHATICS: No lymphadenopathy CARDIAC: RRR, no murmurs, rubs, gallops RESPIRATORY:  Clear to auscultation without rales, wheezing or rhonchi  ABDOMEN: Soft, non-tender, non-distended MUSCULOSKELETAL:  No edema; No deformity  SKIN: Warm and dry NEUROLOGIC:  Alert and oriented x 3 PSYCHIATRIC:  Normal affect    Risk Assessment/Risk Calculators:     CARPREG II Risk Prediction Index Score:  1.  The patient's risk for a primary cardiac event is 5%.   Modified World Health Organization [employer]) Classification of Maternal CV Risk   Class I         ASSESSMENT & PLAN:    Pregnancy with history of Hypertension Diagnosed during last pregnancy, currently controlled. Patient reports stress as a significant factor in previous hypertensive episodes. Currently on Aspirin  and Nifedipine . -Continue Aspirin  and Nifedipine . -Monitor blood pressure at home and upload readings to MyChart every two weeks. -Plan for follow-up in 12 weeks.  History of Multiple Pregnancy Losses Patient reports loss of twin during previous pregnancy and two subsequent losses. Currently [redacted] weeks pregnant and reports anxiety related to potential loss.   Shortness of Breath and atypical chest pain Patient reports episodes of shortness of breath, particularly during periods of high stress at work. Symptoms have improved since leaving work. -Plan for  echocardiogram  General Health Maintenance -Encouraged patient to maintain open communication via MyChart for any concerns or changes in condition.  Patient Instructions  Medication Instructions:  Your physician recommends that you continue on your current medications as directed. Please refer to the Current  Medication list given to you today.  *If you need a refill on your cardiac medications before your next appointment, please call your pharmacy*  Testing/Procedures: Your physician has requested that you have an echocardiogram - OB. Echocardiography is a painless test that uses sound waves to create images of your heart. It provides your doctor with information about the size and shape of your heart and how well your heart's chambers and valves are working. This procedure takes approximately one hour. There are no restrictions for this procedure. Please do NOT wear cologne, perfume, aftershave, or lotions (deodorant is allowed). Please arrive 15 minutes prior to your appointment time.  Please note: We ask at that you not bring children with you during ultrasound (echo/ vascular) testing. Due to room size and safety concerns, children are not allowed in the ultrasound rooms during exams. Our front office staff cannot provide observation of children in our lobby area while testing is being conducted. An adult accompanying a patient to their appointment will only be allowed in the ultrasound room at the discretion of the ultrasound technician under special circumstances. We apologize for any inconvenience.  Follow-Up: At [facility], you and your health needs are our priority.  As part of our continuing mission to provide you with exceptional heart care, we have created designated Provider Care Teams.  These Care Teams include your primary Cardiologist (physician) and Advanced Practice Providers (APPs -  Physician Assistants and Nurse Practitioners) who all work together to provide you  with the care you need, when you need it.  Your next appointment:   12 week(s)  Provider:   Aidin Doane, DO [address], [place], [place] [address] Other Instructions Please take your blood pressure daily for 2 weeks and send in a MyChart message. Please include heart rates. (One message at the end of the 2 weeks).   HOW TO TAKE YOUR BLOOD PRESSURE: Rest 5 minutes before taking your blood pressure. Don't smoke or drink caffeinated beverages for at least 30 minutes before. Take your blood pressure before (not after) you eat. Sit comfortably with your back supported and both feet on the floor (don't cross your legs). Elevate your arm to heart level on a table or a desk. Use the proper sized cuff. It should fit smoothly and snugly around your bare upper arm. There should be enough room to slip a fingertip under the cuff. The bottom edge of the cuff should be 1 inch above the crease of the elbow. Ideally, take 3 measurements at one sitting and record the average.      Dispo:  No follow-ups on file.   Medication Adjustments/Labs and Tests Ordered: Current medicines are reviewed at length with the patient today.  Concerns regarding medicines are outlined above.  Tests Ordered: Orders Placed This Encounter  Procedures   EKG 12-Lead   ECHOCARDIOGRAM COMPLETE   Medication Changes: No orders of the defined types were placed in this encounter.

## 2023-08-24 NOTE — Patient Instructions (Addendum)
 Medication Instructions:  Your physician recommends that you continue on your current medications as directed. Please refer to the Current Medication list given to you today.  *If you need a refill on your cardiac medications before your next appointment, please call your pharmacy*  Testing/Procedures: Your physician has requested that you have an echocardiogram - OB. Echocardiography is a painless test that uses sound waves to create images of your heart. It provides your doctor with information about the size and shape of your heart and how well your heart's chambers and valves are working. This procedure takes approximately one hour. There are no restrictions for this procedure. Please do NOT wear cologne, perfume, aftershave, or lotions (deodorant is allowed). Please arrive 15 minutes prior to your appointment time.  Please note: We ask at that you not bring children with you during ultrasound (echo/ vascular) testing. Due to room size and safety concerns, children are not allowed in the ultrasound rooms during exams. Our front office staff cannot provide observation of children in our lobby area while testing is being conducted. An adult accompanying a patient to their appointment will only be allowed in the ultrasound room at the discretion of the ultrasound technician under special circumstances. We apologize for any inconvenience.  Follow-Up: At [facility], you and your health needs are our priority.  As part of our continuing mission to provide you with exceptional heart care, we have created designated Provider Care Teams.  These Care Teams include your primary Cardiologist (physician) and Advanced Practice Providers (APPs -  Physician Assistants and Nurse Practitioners) who all work together to provide you with the care you need, when you need it.  Your next appointment:   12 week(s)  Provider:   Kardie Tobb, DO [address], [place], [place] [address] Other  Instructions Please take your blood pressure daily for 2 weeks and send in a MyChart message. Please include heart rates. (One message at the end of the 2 weeks).   HOW TO TAKE YOUR BLOOD PRESSURE: Rest 5 minutes before taking your blood pressure. Don't smoke or drink caffeinated beverages for at least 30 minutes before. Take your blood pressure before (not after) you eat. Sit comfortably with your back supported and both feet on the floor (don't cross your legs). Elevate your arm to heart level on a table or a desk. Use the proper sized cuff. It should fit smoothly and snugly around your bare upper arm. There should be enough room to slip a fingertip under the cuff. The bottom edge of the cuff should be 1 inch above the crease of the elbow. Ideally, take 3 measurements at one sitting and record the average.

## 2023-08-30 ENCOUNTER — Other Ambulatory Visit (HOSPITAL_COMMUNITY)
Admission: RE | Admit: 2023-08-30 | Discharge: 2023-08-30 | Disposition: A | Discharge status: Home / Self Care | Payer: 59 | Source: Ambulatory Visit | Attending: Family Medicine | Admitting: Family Medicine

## 2023-08-30 ENCOUNTER — Ambulatory Visit (INDEPENDENT_AMBULATORY_CARE_PROVIDER_SITE_OTHER): Payer: 59 | Admitting: Family Medicine

## 2023-08-30 VITALS — BP 121/77 | HR 92 | Wt 176.0 lb

## 2023-08-30 DIAGNOSIS — O10919 Unspecified pre-existing hypertension complicating pregnancy, unspecified trimester: Secondary | ICD-10-CM

## 2023-08-30 DIAGNOSIS — O09523 Supervision of elderly multigravida, third trimester: Secondary | ICD-10-CM

## 2023-08-30 DIAGNOSIS — N898 Other specified noninflammatory disorders of vagina: Secondary | ICD-10-CM | POA: Diagnosis present

## 2023-08-30 DIAGNOSIS — Z148 Genetic carrier of other disease: Secondary | ICD-10-CM

## 2023-08-30 DIAGNOSIS — O0990 Supervision of high risk pregnancy, unspecified, unspecified trimester: Secondary | ICD-10-CM

## 2023-08-30 DIAGNOSIS — D219 Benign neoplasm of connective and other soft tissue, unspecified: Secondary | ICD-10-CM

## 2023-08-30 DIAGNOSIS — Z3A17 17 weeks gestation of pregnancy: Secondary | ICD-10-CM

## 2023-08-30 NOTE — Progress Notes (Signed)
   PRENATAL VISIT NOTE  Subjective:  Tammy Mcgrath is a 43 y.o. [id number] at [redacted]w[redacted]d being seen today for ongoing prenatal care.  She is currently monitored for the following issues for this high-risk pregnancy and has AMA (advanced maternal age) multigravida 24+; Supervision of high-risk pregnancy; Chronic hypertension in pregnancy; Essential hypertension; Chronic migraine without aura, with intractable migraine, so stated, with status migrainosus; Hearing loss; Atopic dermatitis; Depression, recurrent (HCC); Otitis externa of both ears; Fibroids; and Carrier of genetic disorder on their problem list.  Patient reports no complaints.  Contractions: Not present. Vag. Bleeding: None.  Movement: Present. Denies leaking of fluid.   The following portions of the patient's history were reviewed and updated as appropriate: allergies, current medications, past family history, past medical history, past social history, past surgical history and problem list.   Objective:   Vitals:   08/30/23 1135  BP: 121/77  Pulse: 92  Weight: 176 lb (79.8 kg)    Fetal Status: Fetal Heart Rate (bpm): 155   Movement: Present     General:  Alert, oriented and cooperative. Patient is in no acute distress.  Skin: Skin is warm and dry. No rash noted.   Cardiovascular: Normal heart rate noted  Respiratory: Normal respiratory effort, no problems with respiration noted  Abdomen: Soft, gravid, appropriate for gestational age.  Pain/Pressure: Present     Pelvic: Cervical exam deferred        Extremities: Normal range of motion.  Edema: None  Mental Status: Normal mood and affect. Normal behavior. Normal judgment and thought content.   Assessment and Plan:  Pregnancy: [id number] at [redacted]w[redacted]d 1. [redacted] weeks gestation of pregnancy (Primary)  2. Supervision of high risk pregnancy, antepartum FHT normal  3. Multigravida of advanced maternal age in third trimester ASA 81mg   4. Chronic hypertension in pregnancy On  nifedipine  5. 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  Does not want to do amnio  6. Fibroids  7. Vaginal discharge - Cervicovaginal ancillary only( [facility])   Preterm labor symptoms and general obstetric precautions including but not limited to vaginal bleeding, contractions, leaking of fluid and fetal movement were reviewed in detail with the patient. Please refer to After Visit Summary for other counseling recommendations.   No follow-ups on file.  Future Appointments  Date Time Provider Department Center  09/07/2023  8:15 AM [facility] NURSE [facility]  09/07/2023  8:30 AM [facility] US5 [facility]  09/10/2023 10:00 AM Gilmore Laroche, FNP [facility]  09/18/2023 11:20 AM [facility] ECHO 2 [id number]  10/02/2023 10:55 AM Ralene Muskrat, PA-C [facility] None  11/01/2023 10:35 AM Levie Heritage, DO [facility] None  11/16/2023 11:00 AM Tobb, Lavona Mound, DO [facility] None    Levie Heritage, DO

## 2023-08-31 DIAGNOSIS — D573 Sickle-cell trait: Secondary | ICD-10-CM | POA: Insufficient documentation

## 2023-09-03 LAB — CERVICOVAGINAL ANCILLARY ONLY
Bacterial Vaginitis (gardnerella): POSITIVE — AB
Candida Glabrata: NEGATIVE
Candida Vaginitis: POSITIVE — AB
Comment: NEGATIVE
Comment: NEGATIVE
Comment: NEGATIVE

## 2023-09-04 ENCOUNTER — Encounter: Payer: Self-pay | Admitting: Family Medicine

## 2023-09-04 MED ORDER — METRONIDAZOLE 1.3 % VA GEL: 1.0000 | Freq: Every day | VAGINAL | 0 refills | Status: AC

## 2023-09-04 MED ORDER — FLUCONAZOLE 150 MG PO TABS
150.0000 mg | ORAL_TABLET | Freq: Once | ORAL | 0 refills | Status: AC
Start: 2023-09-04 — End: 2023-09-04

## 2023-09-04 NOTE — Addendum Note (Signed)
Addended by: Levie Heritage on: 09/04/2023 08:52 AM   Modules accepted: Orders

## 2023-09-07 ENCOUNTER — Ambulatory Visit (HOSPITAL_BASED_OUTPATIENT_CLINIC_OR_DEPARTMENT_OTHER): Payer: 59 | Admitting: Obstetrics

## 2023-09-07 ENCOUNTER — Ambulatory Visit: Payer: 59 | Admitting: *Deleted

## 2023-09-07 ENCOUNTER — Other Ambulatory Visit: Payer: Self-pay | Admitting: *Deleted

## 2023-09-07 ENCOUNTER — Ambulatory Visit: Payer: 59 | Attending: Obstetrics & Gynecology

## 2023-09-07 ENCOUNTER — Encounter: Payer: Self-pay | Admitting: *Deleted

## 2023-09-07 VITALS — BP 121/77 | HR 88

## 2023-09-07 DIAGNOSIS — O10912 Unspecified pre-existing hypertension complicating pregnancy, second trimester: Secondary | ICD-10-CM | POA: Diagnosis present

## 2023-09-07 DIAGNOSIS — O9902 Anemia complicating childbirth: Secondary | ICD-10-CM

## 2023-09-07 DIAGNOSIS — O09522 Supervision of elderly multigravida, second trimester: Secondary | ICD-10-CM

## 2023-09-07 DIAGNOSIS — D573 Sickle-cell trait: Secondary | ICD-10-CM

## 2023-09-07 DIAGNOSIS — O99019 Anemia complicating pregnancy, unspecified trimester: Secondary | ICD-10-CM | POA: Diagnosis present

## 2023-09-07 DIAGNOSIS — O0990 Supervision of high risk pregnancy, unspecified, unspecified trimester: Secondary | ICD-10-CM

## 2023-09-07 DIAGNOSIS — O10919 Unspecified pre-existing hypertension complicating pregnancy, unspecified trimester: Secondary | ICD-10-CM

## 2023-09-07 DIAGNOSIS — O09529 Supervision of elderly multigravida, unspecified trimester: Secondary | ICD-10-CM

## 2023-09-07 DIAGNOSIS — Z3A19 19 weeks gestation of pregnancy: Secondary | ICD-10-CM | POA: Diagnosis present

## 2023-09-07 DIAGNOSIS — Z3A09 9 weeks gestation of pregnancy: Secondary | ICD-10-CM

## 2023-09-07 DIAGNOSIS — Z148 Genetic carrier of other disease: Secondary | ICD-10-CM | POA: Insufficient documentation

## 2023-09-07 NOTE — Progress Notes (Signed)
MFM Consult Note  Tammy Mcgrath is currently at 19 weeks and 0 days.  She was seen due to advanced maternal age (43 years old) and chronic hypertension currently treated with nifedipine 60 mg daily.  Both she and the father of the baby are carriers for the sickle cell trait.  She denies any problems in her current pregnancy.    She had a cell free DNA test earlier in her pregnancy which indicated a low risk for trisomy 67, 70, and 13. A female fetus is predicted.   She was informed that the fetal growth and amniotic fluid level were appropriate for her gestational age.   There were no obvious fetal anomalies noted on today's ultrasound exam.  The limitations of ultrasound in the detection of all anomalies was discussed.    The following were discussed during today's consultation:   Advanced maternal age in pregnancy and carrier of sickle cell trait  The increased risk of fetal aneuploidy due to advanced maternal age was discussed.   The patient was advised that as she and the FOB are carriers of the sickle cell trait, there is a 25% chance that their child may be affected with sickle cell disease.  She understands that an amniocentesis is available for definitive prenatal diagnosis of fetal chromosomal abnormalities and sickle cell disease.    She declined the amniocentesis today.  She is comfortable with the low risk for Down syndrome indicated by her cell free DNA test.  Chronic hypertension in pregnancy  The patient was advised to continue taking nifedipine for blood pressure control throughout her pregnancy.    The goal for her blood pressures are 140/90 or less.    The increased risk for superimposed preeclampsia and IUGR associated with chronic hypertension was discussed.    She should continue taking a daily baby aspirin for preeclampsia prophylaxis.  We will continue to follow her with monthly growth ultrasounds.    Weekly fetal testing should be started at around 32  weeks.  Delivery should probably occur at between 37 to 39 weeks depending on the level of her blood pressure control.    The patient stated that all of her questions were answered today.    A follow-up exam was scheduled in 4 weeks.  A total of 30 minutes was spent counseling and coordinating the care for this patient.  Greater than 50% of the time was spent in direct face-to-face contact.

## 2023-09-10 ENCOUNTER — Ambulatory Visit: Payer: 59 | Admitting: Family Medicine

## 2023-09-18 ENCOUNTER — Encounter: Payer: Self-pay | Admitting: Cardiology

## 2023-09-18 ENCOUNTER — Ambulatory Visit (HOSPITAL_COMMUNITY): Payer: 59 | Attending: Cardiovascular Disease

## 2023-09-18 DIAGNOSIS — R0602 Shortness of breath: Secondary | ICD-10-CM | POA: Insufficient documentation

## 2023-09-18 LAB — ECHOCARDIOGRAM COMPLETE
Area-P 1/2: 3.07 cm2
S' Lateral: 2.6 cm

## 2023-09-27 ENCOUNTER — Ambulatory Visit: Payer: 59 | Admitting: Family Medicine

## 2023-10-01 NOTE — Progress Notes (Unsigned)
   PRENATAL VISIT NOTE  Subjective:  Tammy Mcgrath is a 43 y.o. [id number] at [redacted]w[redacted]d being seen today for ongoing prenatal care.  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  Patient reports {[id number]}.   .  .   . Denies leaking of fluid, vaginal bleeding, contractions.  Endorses fetal movement.  The following portions of the patient's history were reviewed and updated as appropriate: allergies, current medications, past family history, past medical history, past social history, past surgical history and problem list.   Objective:  There were no vitals filed for this visit.  Fetal Status:           General:  Alert, oriented and cooperative. Patient is in no acute distress.  Skin: Skin is warm and dry. No rash noted.   Cardiovascular: Normal heart rate noted  Respiratory: Normal respiratory effort, no problems with respiration noted  Abdomen: Soft, gravid, appropriate for gestational age.        Pelvic: Cervical exam deferred        Extremities: Normal range of motion.     Mental Status: Normal mood and affect. Normal behavior. Normal judgment and thought content.   Assessment and Plan:  Pregnancy: [id number] at [redacted]w[redacted]d  1. [redacted] weeks gestation of pregnancy Patient is doing well, feeling regular fetal movement BP, FHR, FH appropriate Discussed Tdap vaccination  2. Supervision of high risk pregnancy, antepartum (Primary) Anticipatory guidance about next visits/weeks of pregnancy given.   3. Chronic hypertension in pregnancy On nifedipine 60 mg twice daily Monthly growth U/S Weekly fetal testing starting 32  weeks Delivery 37-39 weeks depending on BP control  4. AMA (advanced maternal age) multigravida 35+, second trimester Continue ASA 81 mg  5. 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  Please refer to After Visit Summary for other counseling recommendations.   No follow-ups on file.  Future Appointments  Date Time Provider Department Center  10/02/2023 10:55 AM Ralene Muskrat, [place] [facility] None  10/05/2023  3:15 PM [facility] NURSE [facility]  10/05/2023  3:30 PM [facility] US4 [facility]  10/25/2023  1:40 PM Gilmore Laroche, FNP [facility]  11/01/2023 10:35 AM Levie Heritage, DO [facility] None  11/16/2023 11:00 AM Tobb, Lavona [place], DO [facility] None    Ralene Muskrat, [place]

## 2023-10-02 ENCOUNTER — Ambulatory Visit (INDEPENDENT_AMBULATORY_CARE_PROVIDER_SITE_OTHER): Payer: 59 | Admitting: Physician Assistant

## 2023-10-02 ENCOUNTER — Other Ambulatory Visit (HOSPITAL_COMMUNITY)
Admission: RE | Admit: 2023-10-02 | Discharge: 2023-10-02 | Disposition: A | Discharge status: Home / Self Care | Payer: 59 | Source: Ambulatory Visit | Attending: Physician Assistant | Admitting: Physician Assistant

## 2023-10-02 VITALS — BP 124/82 | HR 102 | Wt 180.0 lb

## 2023-10-02 DIAGNOSIS — O09522 Supervision of elderly multigravida, second trimester: Secondary | ICD-10-CM | POA: Diagnosis not present

## 2023-10-02 DIAGNOSIS — Z3A22 22 weeks gestation of pregnancy: Secondary | ICD-10-CM | POA: Diagnosis not present

## 2023-10-02 DIAGNOSIS — O0990 Supervision of high risk pregnancy, unspecified, unspecified trimester: Secondary | ICD-10-CM

## 2023-10-02 DIAGNOSIS — O10919 Unspecified pre-existing hypertension complicating pregnancy, unspecified trimester: Secondary | ICD-10-CM

## 2023-10-02 DIAGNOSIS — Z148 Genetic carrier of other disease: Secondary | ICD-10-CM

## 2023-10-02 DIAGNOSIS — N898 Other specified noninflammatory disorders of vagina: Secondary | ICD-10-CM

## 2023-10-03 ENCOUNTER — Encounter: Payer: Self-pay | Admitting: Physician Assistant

## 2023-10-03 LAB — CERVICOVAGINAL ANCILLARY ONLY
Bacterial Vaginitis (gardnerella): NEGATIVE
Candida Glabrata: NEGATIVE
Candida Vaginitis: NEGATIVE
Chlamydia: NEGATIVE
Comment: NEGATIVE
Comment: NEGATIVE
Comment: NEGATIVE
Comment: NEGATIVE
Comment: NEGATIVE
Comment: NORMAL
Neisseria Gonorrhea: NEGATIVE
Trichomonas: NEGATIVE

## 2023-10-05 ENCOUNTER — Ambulatory Visit: Payer: 59

## 2023-10-05 ENCOUNTER — Ambulatory Visit: Payer: 59 | Attending: Obstetrics | Admitting: *Deleted

## 2023-10-05 ENCOUNTER — Other Ambulatory Visit: Payer: Self-pay | Admitting: *Deleted

## 2023-10-05 VITALS — BP 116/77 | HR 101

## 2023-10-05 DIAGNOSIS — O09522 Supervision of elderly multigravida, second trimester: Secondary | ICD-10-CM

## 2023-10-05 DIAGNOSIS — Z3A23 23 weeks gestation of pregnancy: Secondary | ICD-10-CM | POA: Insufficient documentation

## 2023-10-05 DIAGNOSIS — Z148 Genetic carrier of other disease: Secondary | ICD-10-CM | POA: Insufficient documentation

## 2023-10-05 DIAGNOSIS — O10012 Pre-existing essential hypertension complicating pregnancy, second trimester: Secondary | ICD-10-CM | POA: Insufficient documentation

## 2023-10-05 DIAGNOSIS — O10912 Unspecified pre-existing hypertension complicating pregnancy, second trimester: Secondary | ICD-10-CM

## 2023-10-05 DIAGNOSIS — O285 Abnormal chromosomal and genetic finding on antenatal screening of mother: Secondary | ICD-10-CM | POA: Diagnosis not present

## 2023-10-05 DIAGNOSIS — O99012 Anemia complicating pregnancy, second trimester: Secondary | ICD-10-CM

## 2023-10-05 DIAGNOSIS — D573 Sickle-cell trait: Secondary | ICD-10-CM

## 2023-10-05 DIAGNOSIS — Z862 Personal history of diseases of the blood and blood-forming organs and certain disorders involving the immune mechanism: Secondary | ICD-10-CM | POA: Insufficient documentation

## 2023-10-25 ENCOUNTER — Ambulatory Visit (INDEPENDENT_AMBULATORY_CARE_PROVIDER_SITE_OTHER): Payer: 59 | Admitting: Family Medicine

## 2023-10-25 ENCOUNTER — Encounter: Payer: Self-pay | Admitting: Family Medicine

## 2023-10-25 VITALS — BP 116/75 | HR 103 | Ht 69.0 in | Wt 183.1 lb

## 2023-10-25 DIAGNOSIS — I10 Essential (primary) hypertension: Secondary | ICD-10-CM

## 2023-10-25 NOTE — Assessment & Plan Note (Signed)
 Continue Continue treatment regimen as is A low-sodium diet of less than 2,300 mg daily is recommended, along with moderate-intensity physical activity for at least 150 minutes per week. The patient is encouraged to maintain these lifestyle modifications to help manage her blood pressure effectively.  Long-term considerations were discussed, emphasizing that uncontrolled hypertension increases the risk of cardiovascular diseases, including stroke, coronary artery disease, and heart failure.  The patient is encouraged to seek emergency care if blood pressure exceeds 180/120 and is accompanied by symptoms such as headaches, chest pain, palpitations, blurred vision, or dizziness. She verbalized understanding and will follow up as scheduled.  BP Readings from Last 3 Encounters:  10/25/23 116/75  10/05/23 116/77  10/02/23 124/82

## 2023-10-25 NOTE — Progress Notes (Signed)
 Established Patient Office Visit  Subjective:  Patient ID: Tammy Mcgrath, female    DOB: [birth year]  Age: 43 y.o. MRN: [id number]  CC:  Chief Complaint  Patient presents with   Care Management    5 month f/u, reports doing well.     HPI Tammy Mcgrath is a 43 y.o. female with past medical history of essential hypertension presents for f/u of  chronic medical conditions. For the details of today's visit, please refer to the assessment and plan.     Past Medical History:  Diagnosis Date   Atopic dermatitis 04/11/2022   Chronic hypertension    Chronic migraine without aura, with intractable migraine, so stated, with status migrainosus 03/26/2022   Depression    not a formal dx, "is sad, not suicidal"   Eczema    Fibroid    Headache    Migraines    Otitis externa of both ears 12/07/2022   Sickle cell trait (HCC)    UTI (urinary tract infection)     Past Surgical History:  Procedure Laterality Date   BREAST SURGERY     reduction   DILATION AND CURETTAGE OF UTERUS      Family History  Problem Relation Age of Onset   Other Mother        believes she has problems, but doens't go to dr or talk about it   Hypertension Father    Stroke Father     Social History   Socioeconomic History   Marital status: Single    Spouse name: Not on file   Number of children: Not on file   Years of education: Not on file   Highest education level: Not on file  Occupational History   Not on file  Tobacco Use   Smoking status: Never   Smokeless tobacco: Never  Vaping Use   Vaping status: Never Used  Substance and Sexual Activity   Alcohol use: Not Currently   Drug use: Not Currently    Types: Marijuana    Comment: last was ~3 months ago   Sexual activity: Not Currently  Other Topics Concern   Not on file  Social History Narrative   Not on file   Social Drivers of Health   Financial Resource Strain: Not on file  Food Insecurity: No Food Insecurity (08/24/2023)    Hunger Vital Sign    Worried About Running Out of Food in the Last Year: Never true    Ran Out of Food in the Last Year: Never true  Transportation Needs: No Transportation Needs (08/24/2023)   PRAPARE - Administrator, Civil Service (Medical): No    Lack of Transportation (Non-Medical): No  Physical Activity: Not on file  Stress: Not on file  Social Connections: Not on file  Intimate Partner Violence: Not on file    Outpatient Medications Prior to Visit  Medication Sig Dispense Refill   aspirin EC 81 MG tablet Take 2 tablets (162 mg total) by mouth at bedtime. Start taking when you are [redacted] weeks pregnant for rest of pregnancy for prevention of preeclampsia 300 tablet 2   NIFEdipine (PROCARDIA XL/NIFEDICAL XL) 60 MG 24 hr tablet Take 1 tablet (60 mg total) by mouth 2 (two) times daily. 90 tablet 0   Prenatal Vit-Fe Fumarate-FA (PRENATAL VITAMIN PO) Take by mouth.     No facility-administered medications prior to visit.    No Known Allergies  ROS Review of Systems  Constitutional:  Negative for chills and fever.  Eyes:  Negative for visual disturbance.  Respiratory:  Negative for chest tightness and shortness of breath.   Neurological:  Negative for dizziness and headaches.      Objective:    Physical Exam HENT:     Head: Normocephalic.     Mouth/Throat:     Mouth: Mucous membranes are moist.  Cardiovascular:     Rate and Rhythm: Normal rate.     Heart sounds: Normal heart sounds.  Pulmonary:     Effort: Pulmonary effort is normal.     Breath sounds: Normal breath sounds.  Neurological:     Mental Status: She is alert.     BP 116/75   Pulse (!) 103   Ht 5\' 9"  (1.753 m)   Wt 183 lb 1.9 oz (83.1 kg)   LMP 05/09/2023   SpO2 96%   BMI 27.04 kg/m  Wt Readings from Last 3 Encounters:  10/25/23 183 lb 1.9 oz (83.1 kg)  10/02/23 180 lb (81.6 kg)  08/30/23 176 lb (79.8 kg)    Lab Results  Component Value Date   TSH 1.920 07/04/2023   Lab Results   Component Value Date   WBC 5.2 07/04/2023   HGB 12.3 07/04/2023   HCT 39.2 07/04/2023   MCV 81 07/04/2023   PLT 259 07/04/2023   Lab Results  Component Value Date   NA 133 (L) 07/04/2023   K 4.2 07/04/2023   CO2 24 07/04/2023   GLUCOSE 93 07/04/2023   BUN 7 07/04/2023   CREATININE 0.63 07/04/2023   BILITOT 0.5 07/04/2023   ALKPHOS 69 07/04/2023   AST 15 07/04/2023   ALT 8 07/04/2023   PROT 7.1 07/04/2023   ALBUMIN 4.2 07/04/2023   CALCIUM 9.1 07/04/2023   ANIONGAP 7 01/19/2022   EGFR 115 07/04/2023   Lab Results  Component Value Date   CHOL 199 12/06/2022   Lab Results  Component Value Date   HDL 63 12/06/2022   Lab Results  Component Value Date   LDLCALC 122 (H) 12/06/2022   Lab Results  Component Value Date   TRIG 76 12/06/2022   Lab Results  Component Value Date   CHOLHDL 3.2 12/06/2022   Lab Results  Component Value Date   HGBA1C 5.7 (H) 07/04/2023      Assessment & Plan:  Essential hypertension Assessment & Plan: Continue Continue treatment regimen as is A low-sodium diet of less than 2,300 mg daily is recommended, along with moderate-intensity physical activity for at least 150 minutes per week. The patient is encouraged to maintain these lifestyle modifications to help manage her blood pressure effectively.  Long-term considerations were discussed, emphasizing that uncontrolled hypertension increases the risk of cardiovascular diseases, including stroke, coronary artery disease, and heart failure.  The patient is encouraged to seek emergency care if blood pressure exceeds 180/120 and is accompanied by symptoms such as headaches, chest pain, palpitations, blurred vision, or dizziness. She verbalized understanding and will follow up as scheduled.  BP Readings from Last 3 Encounters:  10/25/23 116/75  10/05/23 116/77  10/02/23 124/82      Note: This chart has been completed using [employer] software, and while attempts have been  made to ensure accuracy, certain words and phrases may not be transcribed as intended.    Follow-up: Return in about 6 months (around 04/26/2024).   Gilmore Laroche, FNP

## 2023-10-25 NOTE — Patient Instructions (Signed)
I appreciate the opportunity to provide care to you today!    Follow up:  6 months   Attached with your AVS, you will find valuable resources for self-education. I highly recommend dedicating some time to thoroughly examine them.   Please continue to a heart-healthy diet and increase your physical activities. Try to exercise for at least five days a week.    It was a pleasure to see you and I look forward to continuing to work together on your health and well-being. Please do not hesitate to call the office if you need care or have questions about your care.  In case of emergency, please visit the Emergency Department for urgent care, or contact our clinic at [phone] to schedule an appointment. We're here to help you!   Have a wonderful day and week. With Gratitude, Gilmore Laroche MSN, FNP-BC

## 2023-11-01 ENCOUNTER — Other Ambulatory Visit (HOSPITAL_COMMUNITY)
Admission: RE | Admit: 2023-11-01 | Discharge: 2023-11-01 | Disposition: A | Discharge status: Home / Self Care | Source: Ambulatory Visit | Attending: Family Medicine | Admitting: Family Medicine

## 2023-11-01 ENCOUNTER — Ambulatory Visit: Payer: 59 | Admitting: Family Medicine

## 2023-11-01 VITALS — BP 109/81 | HR 97 | Wt 183.0 lb

## 2023-11-01 DIAGNOSIS — Z3A26 26 weeks gestation of pregnancy: Secondary | ICD-10-CM | POA: Diagnosis not present

## 2023-11-01 DIAGNOSIS — O10919 Unspecified pre-existing hypertension complicating pregnancy, unspecified trimester: Secondary | ICD-10-CM

## 2023-11-01 DIAGNOSIS — O09523 Supervision of elderly multigravida, third trimester: Secondary | ICD-10-CM

## 2023-11-01 DIAGNOSIS — O99012 Anemia complicating pregnancy, second trimester: Secondary | ICD-10-CM

## 2023-11-01 DIAGNOSIS — D573 Sickle-cell trait: Secondary | ICD-10-CM

## 2023-11-01 DIAGNOSIS — N898 Other specified noninflammatory disorders of vagina: Secondary | ICD-10-CM | POA: Insufficient documentation

## 2023-11-01 DIAGNOSIS — O0990 Supervision of high risk pregnancy, unspecified, unspecified trimester: Secondary | ICD-10-CM

## 2023-11-01 DIAGNOSIS — I10 Essential (primary) hypertension: Secondary | ICD-10-CM

## 2023-11-01 DIAGNOSIS — O26813 Pregnancy related exhaustion and fatigue, third trimester: Secondary | ICD-10-CM

## 2023-11-01 DIAGNOSIS — Z148 Genetic carrier of other disease: Secondary | ICD-10-CM

## 2023-11-01 DIAGNOSIS — O99019 Anemia complicating pregnancy, unspecified trimester: Secondary | ICD-10-CM

## 2023-11-01 DIAGNOSIS — D509 Iron deficiency anemia, unspecified: Secondary | ICD-10-CM

## 2023-11-01 NOTE — Progress Notes (Signed)
   PRENATAL VISIT NOTE  Subjective:  Tammy Mcgrath is a 43 y.o. [id number] at [redacted]w[redacted]d being seen today for ongoing prenatal care.  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  Patient reports  fatigue. Went for [facility] appt and was told that her Hg is 7.7 .  Contractions: Not present. Vag. Bleeding: None.  Movement: Present. Denies leaking of fluid.   The following portions of the patient's history were reviewed and updated as appropriate: allergies, current medications, past family history, past medical history, past social history, past surgical history and problem list.   Objective:   Vitals:   11/01/23 1050  BP: 109/81  Pulse: 97  Weight: 183 lb (83 kg)    Fetal Status: Fetal Heart Rate (bpm): 159   Movement: Present     General:  Alert, oriented and cooperative. Patient is in no acute distress.  Skin: Skin is warm and dry. No rash noted.   Cardiovascular: Normal heart rate noted  Respiratory: Normal respiratory effort, no problems with respiration noted  Abdomen: Soft, gravid, appropriate for gestational age.  Pain/Pressure: Present     Pelvic: Cervical exam deferred        Extremities: Normal range of motion.  Edema: None  Mental Status: Normal mood and affect. Normal behavior. Normal judgment and thought content.   Assessment and Plan:  Pregnancy: [id number] at [redacted]w[redacted]d 1. [redacted] weeks gestation of pregnancy (Primary)  2. Supervision of high risk pregnancy, antepartum - CBC - TSH Rfx on Abnormal to Free T4 - Vitamin B12 - Folate - Iron and TIBC - Ferritin  3. Multigravida of advanced maternal age in third  trimester ASA 81mg   4. Chronic hypertension in pregnancy BP normal EFW 61%  5. Essential hypertension  6. Carrier of spinal muscular atrophy-^ risk  7. 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 Pregnancy related fatigue in third trimester Check Anemia labs - CBC - TSH Rfx on Abnormal to Free T4 - Vitamin B12 - Folate - Iron and TIBC - Ferritin  9. Vaginal discharge Has increased vaginal discharge - Cervicovaginal ancillary only( [place])  Preterm labor symptoms and general obstetric precautions including but not limited to vaginal bleeding, contractions, leaking of fluid and fetal movement were reviewed in detail with the patient. Please refer to After Visit Summary for other counseling recommendations.   No follow-ups on file.  Future Appointments  Date Time Provider Department Center  11/09/2023  3:15 PM [facility] NURSE [facility]  11/09/2023  3:30 PM [facility] US5 [facility]  11/16/2023 11:00 AM Tobb, Kardie, DO [facility] None  11/27/2023 10:15 AM Sue Lush, FNP [facility] None  12/11/2023 10:35 AM Sue Lush, FNP [facility] None  12/26/2023 10:15 AM Shea Evans, Gillian Scarce, MD [facility] None  01/09/2024 10:35 AM Levie Heritage, DO [facility] None  04/28/2024  3:00 PM Gilmore Laroche, FNP [facility]    Levie Heritage, DO

## 2023-11-02 ENCOUNTER — Encounter: Payer: Self-pay | Admitting: Family Medicine

## 2023-11-02 DIAGNOSIS — D509 Iron deficiency anemia, unspecified: Secondary | ICD-10-CM | POA: Insufficient documentation

## 2023-11-02 DIAGNOSIS — O99012 Anemia complicating pregnancy, second trimester: Secondary | ICD-10-CM

## 2023-11-02 HISTORY — DX: Iron deficiency anemia, unspecified: D50.9

## 2023-11-02 HISTORY — DX: Iron deficiency anemia, unspecified: O99.012

## 2023-11-02 LAB — CERVICOVAGINAL ANCILLARY ONLY
Bacterial Vaginitis (gardnerella): NEGATIVE
Candida Glabrata: NEGATIVE
Candida Vaginitis: NEGATIVE
Comment: NEGATIVE
Comment: NEGATIVE
Comment: NEGATIVE

## 2023-11-02 LAB — IRON AND TIBC
Iron Saturation: 8 % — CL (ref 15–55)
Iron: 38 ug/dL (ref 27–159)
Total Iron Binding Capacity: 493 ug/dL — ABNORMAL HIGH (ref 250–450)
UIBC: 455 ug/dL — ABNORMAL HIGH (ref 131–425)

## 2023-11-02 LAB — VITAMIN B12: Vitamin B-12: 676 pg/mL (ref 232–1245)

## 2023-11-02 LAB — CBC
Hematocrit: 30.4 % — ABNORMAL LOW (ref 34.0–46.6)
Hemoglobin: 9.9 g/dL — ABNORMAL LOW (ref 11.1–15.9)
MCH: 25.1 pg — ABNORMAL LOW (ref 26.6–33.0)
MCHC: 32.6 g/dL (ref 31.5–35.7)
MCV: 77 fL — ABNORMAL LOW (ref 79–97)
Platelets: 248 10*3/uL (ref 150–450)
RBC: 3.94 x10E6/uL (ref 3.77–5.28)
RDW: 14.4 % (ref 11.7–15.4)
WBC: 5.4 10*3/uL (ref 3.4–10.8)

## 2023-11-02 LAB — TSH RFX ON ABNORMAL TO FREE T4: TSH: 1.44 u[IU]/mL (ref 0.450–4.500)

## 2023-11-02 LAB — FERRITIN: Ferritin: 8 ng/mL — ABNORMAL LOW (ref 15–150)

## 2023-11-02 LAB — FOLATE: Folate: 19.2 ng/mL

## 2023-11-02 NOTE — Addendum Note (Signed)
 Addended by: Levie Heritage on: 11/02/2023 03:29 PM   Modules accepted: Orders

## 2023-11-05 ENCOUNTER — Telehealth: Payer: Self-pay

## 2023-11-05 NOTE — Telephone Encounter (Signed)
 Dr. Adrian Blackwater, patient will be scheduled as soon as possible.  Auth Submission: NO AUTH NEEDED Site of care: Site of care: [facility] Payer: [employer] and [employer] medicaid Medication & CPT/J Code(s) submitted: Venofer (Iron Sucrose) J1756 Route of submission (phone, fax, portal): portal Phone # Fax # Auth type: Buy/Bill PB Units/visits requested: 500mg  x 2 doses Reference number:  Approval from: 11/05/23 to 05/07/24

## 2023-11-06 ENCOUNTER — Ambulatory Visit

## 2023-11-06 VITALS — BP 130/87 | HR 91 | Temp 98.1°F | Resp 16 | Ht 69.0 in | Wt 183.6 lb

## 2023-11-06 DIAGNOSIS — Z3A27 27 weeks gestation of pregnancy: Secondary | ICD-10-CM

## 2023-11-06 DIAGNOSIS — D509 Iron deficiency anemia, unspecified: Secondary | ICD-10-CM

## 2023-11-06 DIAGNOSIS — O99012 Anemia complicating pregnancy, second trimester: Secondary | ICD-10-CM

## 2023-11-06 DIAGNOSIS — D508 Other iron deficiency anemias: Secondary | ICD-10-CM | POA: Diagnosis not present

## 2023-11-06 MED ORDER — SODIUM CHLORIDE 0.9 % IV SOLN
500.0000 mg | Freq: Once | INTRAVENOUS | Status: AC
  Administered 2023-11-06: 500 mg via INTRAVENOUS
  Filled 2023-11-06: qty 25

## 2023-11-06 NOTE — Progress Notes (Signed)
 Diagnosis: Iron Deficiency Anemia  Provider:  Chilton Greathouse MD  Procedure: IV Infusion  IV Type: Peripheral, IV Location: R Antecubital  Venofer (Iron Sucrose), Dose: 500 mg  Infusion Start Time: 1013  Infusion Stop Time: 1437  Post Infusion IV Care: Observation period completed and Peripheral IV Discontinued  Discharge: Condition: Good, Destination: Home . AVS Provided  Performed by:  Rico Ala, LPN

## 2023-11-09 ENCOUNTER — Ambulatory Visit: Payer: 59

## 2023-11-16 ENCOUNTER — Ambulatory Visit: Payer: 59 | Admitting: Cardiology

## 2023-11-20 ENCOUNTER — Ambulatory Visit (INDEPENDENT_AMBULATORY_CARE_PROVIDER_SITE_OTHER)

## 2023-11-20 VITALS — BP 130/85 | HR 87 | Temp 98.2°F | Resp 18 | Ht 69.0 in | Wt 186.0 lb

## 2023-11-20 DIAGNOSIS — O99013 Anemia complicating pregnancy, third trimester: Secondary | ICD-10-CM | POA: Diagnosis not present

## 2023-11-20 DIAGNOSIS — Z3A29 29 weeks gestation of pregnancy: Secondary | ICD-10-CM

## 2023-11-20 DIAGNOSIS — D508 Other iron deficiency anemias: Secondary | ICD-10-CM

## 2023-11-20 DIAGNOSIS — D509 Iron deficiency anemia, unspecified: Secondary | ICD-10-CM

## 2023-11-20 MED ORDER — SODIUM CHLORIDE 0.9 % IV SOLN
500.0000 mg | Freq: Once | INTRAVENOUS | Status: AC
  Administered 2023-11-20: 500 mg via INTRAVENOUS
  Filled 2023-11-20: qty 25

## 2023-11-20 NOTE — Progress Notes (Signed)
 Diagnosis: Iron Deficiency Anemia  Provider:  Chilton Greathouse MD  Procedure: IV Infusion  IV Type: Peripheral, IV Location: R Antecubital  Venofer (Iron Sucrose), Dose: 500 mg  Infusion Start Time: 0909  Infusion Stop Time: 1258  Post Infusion IV Care: Observation period completed and Peripheral IV Discontinued  Discharge: Condition: Good, Destination: Home . AVS Declined  Performed by:  Loney Hering, LPN

## 2023-11-27 ENCOUNTER — Ambulatory Visit: Payer: 59 | Admitting: Obstetrics and Gynecology

## 2023-11-27 VITALS — BP 143/89 | HR 92 | Wt 189.0 lb

## 2023-11-27 DIAGNOSIS — D573 Sickle-cell trait: Secondary | ICD-10-CM

## 2023-11-27 DIAGNOSIS — Z148 Genetic carrier of other disease: Secondary | ICD-10-CM

## 2023-11-27 DIAGNOSIS — O10919 Unspecified pre-existing hypertension complicating pregnancy, unspecified trimester: Secondary | ICD-10-CM | POA: Diagnosis not present

## 2023-11-27 DIAGNOSIS — Z3A30 30 weeks gestation of pregnancy: Secondary | ICD-10-CM | POA: Diagnosis not present

## 2023-11-27 DIAGNOSIS — O99019 Anemia complicating pregnancy, unspecified trimester: Secondary | ICD-10-CM

## 2023-11-27 DIAGNOSIS — O99012 Anemia complicating pregnancy, second trimester: Secondary | ICD-10-CM

## 2023-11-27 DIAGNOSIS — O09523 Supervision of elderly multigravida, third trimester: Secondary | ICD-10-CM | POA: Diagnosis not present

## 2023-11-27 DIAGNOSIS — O0990 Supervision of high risk pregnancy, unspecified, unspecified trimester: Secondary | ICD-10-CM | POA: Diagnosis not present

## 2023-11-27 DIAGNOSIS — D509 Iron deficiency anemia, unspecified: Secondary | ICD-10-CM

## 2023-11-27 NOTE — Progress Notes (Signed)
   PRENATAL VISIT NOTE  Subjective:  Tammy Mcgrath is a 43 y.o. [id number] at [redacted]w[redacted]d being seen today for ongoing prenatal care.  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  Patient reports .  Contractions: Not present. Vag. Bleeding: None.  Movement: Present. Denies leaking of fluid.   The following portions of the patient's history were reviewed and updated as appropriate: allergies, current medications, past family history, past medical history, past social history, past surgical history and problem list.   Objective:   Vitals:   11/27/23 1025 11/27/23 1028  BP: (!) 130/95 (!) 143/89  Pulse: 92   Weight: 189 lb (85.7 kg)     Fetal Status: Fetal Heart Rate (bpm): 148   Movement: Present     General:  Alert, oriented and cooperative. Patient is in no acute distress.  Skin: Skin is warm and dry. No rash noted.   Cardiovascular: Normal heart rate noted  Respiratory: Normal respiratory effort, no problems with respiration noted  Abdomen: Soft, gravid, appropriate for gestational age.  Pain/Pressure: Absent     Pelvic: Cervical exam deferred        Extremities: Normal range of motion.  Edema: None  Mental Status: Normal mood and affect. Normal behavior. Normal judgment and thought content.   Assessment and Plan:  Pregnancy: [id number] at [redacted]w[redacted]d 1. Supervision of high risk pregnancy, antepartum (Primary) FHR normal  feeling regular movement    2. [redacted] weeks gestation of pregnancy   3. Multigravida of advanced maternal age in third  trimester Continue ASA 2/21 efw 61 Follow up u/s 4/11  4. Chronic hypertension in pregnancy On procardia 60 mg daily, elevated today has not taken medicine yet. Encouraged to take medicine and recheck at home  Precautions given   5. Carrier of spinal muscular atrophy-^ risk   6. 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 Maternal iron deficiency anemia affecting pregnancy in second trimester, antepartum S/p iv iron x2, still having fatigue check blood counts next visit   Preterm labor symptoms and general obstetric precautions including but not limited to vaginal bleeding, contractions, leaking of fluid and fetal movement were reviewed in detail with the patient. Please refer to After Visit Summary for other counseling recommendations.   Return lab visit glucose test  asap.  Future Appointments  Date Time Provider Department Center  11/29/2023  7:00 AM [facility] PROVIDER 1 [facility]  11/29/2023  7:30 AM [facility] US1 [facility]  12/11/2023 10:35 AM Sue Lush, FNP [facility] None  12/26/2023 10:15 AM Shea Evans, Gillian Scarce, MD [facility] None  01/09/2024 10:35 AM Levie Heritage, DO [facility] None  01/16/2024 10:15 AM Levie Heritage, DO [facility] None  01/23/2024 10:55 AM Shea Evans, Gillian Scarce, MD [facility] None  01/30/2024 10:35 AM Shea Evans, Gillian Scarce, MD [facility] None  02/29/2024  9:20 AM Thomasene Ripple, DO [facility] None  04/28/2024  3:00 PM Gilmore Laroche, FNP [facility]    Albertine Grates, FNP

## 2023-11-29 ENCOUNTER — Ambulatory Visit: Attending: Obstetrics

## 2023-11-29 ENCOUNTER — Other Ambulatory Visit: Payer: Self-pay | Admitting: *Deleted

## 2023-11-29 ENCOUNTER — Ambulatory Visit (HOSPITAL_BASED_OUTPATIENT_CLINIC_OR_DEPARTMENT_OTHER): Admitting: Maternal & Fetal Medicine

## 2023-11-29 VITALS — BP 122/79 | HR 89

## 2023-11-29 DIAGNOSIS — O99012 Anemia complicating pregnancy, second trimester: Secondary | ICD-10-CM | POA: Diagnosis present

## 2023-11-29 DIAGNOSIS — Z3A30 30 weeks gestation of pregnancy: Secondary | ICD-10-CM | POA: Insufficient documentation

## 2023-11-29 DIAGNOSIS — O09523 Supervision of elderly multigravida, third trimester: Secondary | ICD-10-CM

## 2023-11-29 DIAGNOSIS — O09522 Supervision of elderly multigravida, second trimester: Secondary | ICD-10-CM | POA: Diagnosis present

## 2023-11-29 DIAGNOSIS — Z148 Genetic carrier of other disease: Secondary | ICD-10-CM | POA: Diagnosis present

## 2023-11-29 DIAGNOSIS — D509 Iron deficiency anemia, unspecified: Secondary | ICD-10-CM

## 2023-11-29 DIAGNOSIS — O10013 Pre-existing essential hypertension complicating pregnancy, third trimester: Secondary | ICD-10-CM

## 2023-11-29 DIAGNOSIS — O10912 Unspecified pre-existing hypertension complicating pregnancy, second trimester: Secondary | ICD-10-CM | POA: Insufficient documentation

## 2023-11-29 DIAGNOSIS — O10913 Unspecified pre-existing hypertension complicating pregnancy, third trimester: Secondary | ICD-10-CM

## 2023-11-29 NOTE — Progress Notes (Signed)
   Patient information  Patient Name: Tammy Mcgrath  Patient MRN:   [id number]  Referring practice: MFM Referring Provider: [facility] ([facility])  MFM CONSULT  Tammy Mcgrath is a 43 y.o. [id number] at [redacted]w[redacted]d here for ultrasound and consultation. 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  RE CHTN:  Her blood pressure is well controlled on Procardia 60 mg every day. Today BP is at goal (<140/90) with a reading of 122/79. She has no other concerning signs of symptoms.    Sonographic findings Single intrauterine pregnancy at 30w 6d.  Fetal cardiac activity:  Observed and appears normal. Presentation: Cephalic. Interval fetal anatomy appears normal. Fetal biometry shows the estimated fetal weight at the 34 percentile. Amniotic fluid volume: Within normal limits. MVP: 3.97 cm. Placenta: Posterior.  There are limitations of prenatal ultrasound such as the inability to detect certain abnormalities due to poor visualization. Various factors such as fetal position, gestational age and maternal body habitus may increase the difficulty in visualizing the fetal anatomy.     Recommendations -Serial growth ultrasounds every 4-6 weeks until delivery -Antenatal testing to start around 32 weeks  -Delivery around [redacted] weeks gestation or sooner if indicated  Review of Systems: A review of systems was performed  and was negative except per HPI   Vitals and Physical Exam    11/29/2023    7:19 AM 11/27/2023   10:28 AM 11/27/2023   10:25 AM  Vitals with BMI  Weight   189 lbs  BMI   27.9  Systolic 122 143 147  Diastolic 79 89 95  Pulse 89  92    Sitting comfortably on the sonogram table Nonlabored breathing Normal rate and rhythm Abdomen is nontender  Past pregnancies OB History  Gravida Para Term Preterm AB Living  7 2 2  4 2   SAB IAB Ectopic Multiple Live Births  4   0 2    # Outcome Date GA Lbr Len/2nd Weight Sex Type Anes PTL Lv  7 Current           6 SAB 02/2023          5 SAB 07/14/22 [redacted]w[redacted]d         4 Term 06/22/21 [redacted]w[redacted]d 02:05 / 00:44 7 lb (3.175 kg) F Vag-Spont EPI  LIV  3 Term 09/10/09    F Vag-Spont   LIV  2 SAB           1 SAB              I spent 10 minutes reviewing the patients chart, including labs and images as well as counseling the patient about her medical conditions. Greater than 50% of the time was spent in direct face-to-face patient counseling.  Braxton Feathers  MFM, [facility]   11/29/2023  8:03 AM

## 2023-12-03 ENCOUNTER — Ambulatory Visit

## 2023-12-03 DIAGNOSIS — O0990 Supervision of high risk pregnancy, unspecified, unspecified trimester: Secondary | ICD-10-CM

## 2023-12-03 DIAGNOSIS — Z3A30 30 weeks gestation of pregnancy: Secondary | ICD-10-CM

## 2023-12-03 NOTE — Progress Notes (Signed)
 Patient here to pick up Rx for labs.  Tammy Mcgrath

## 2023-12-04 ENCOUNTER — Encounter: Payer: Self-pay | Admitting: Obstetrics and Gynecology

## 2023-12-04 LAB — GLUCOSE TOLERANCE, 2 HOURS W/ 1HR
Glucose, 1 hour: 218 mg/dL — ABNORMAL HIGH (ref 70–179)
Glucose, 2 hour: 164 mg/dL — ABNORMAL HIGH (ref 70–152)
Glucose, Fasting: 102 mg/dL — ABNORMAL HIGH (ref 70–91)

## 2023-12-05 ENCOUNTER — Other Ambulatory Visit: Payer: Self-pay

## 2023-12-05 DIAGNOSIS — O24410 Gestational diabetes mellitus in pregnancy, diet controlled: Secondary | ICD-10-CM

## 2023-12-05 MED ORDER — ACCU-CHEK GUIDE W/DEVICE KIT: 1.0000 | PACK | Freq: Four times a day (QID) | 0 refills | Status: DC

## 2023-12-05 MED ORDER — ACCU-CHEK SOFTCLIX LANCETS MISC: 12 refills | Status: DC

## 2023-12-05 MED ORDER — GLUCOSE BLOOD VI STRP: ORAL_STRIP | 12 refills | Status: DC

## 2023-12-06 ENCOUNTER — Ambulatory Visit: Attending: Maternal & Fetal Medicine | Admitting: Obstetrics and Gynecology

## 2023-12-06 ENCOUNTER — Ambulatory Visit (HOSPITAL_BASED_OUTPATIENT_CLINIC_OR_DEPARTMENT_OTHER)

## 2023-12-06 VITALS — BP 126/78 | HR 86

## 2023-12-06 DIAGNOSIS — O09523 Supervision of elderly multigravida, third trimester: Secondary | ICD-10-CM

## 2023-12-06 DIAGNOSIS — O10919 Unspecified pre-existing hypertension complicating pregnancy, unspecified trimester: Secondary | ICD-10-CM | POA: Diagnosis not present

## 2023-12-06 DIAGNOSIS — Z3A31 31 weeks gestation of pregnancy: Secondary | ICD-10-CM

## 2023-12-06 DIAGNOSIS — O10013 Pre-existing essential hypertension complicating pregnancy, third trimester: Secondary | ICD-10-CM | POA: Insufficient documentation

## 2023-12-06 NOTE — Progress Notes (Signed)
 After review, MFM consult with provider is not indicated for today  Cassandria Clever, MD 12/06/2023 6:30 PM  [facility]

## 2023-12-06 NOTE — Procedures (Signed)
 Angellina Whistler 24-Oct-1980 [redacted]w[redacted]d  Fetus A Non-Stress Test Interpretation for 12/06/23  Indication: Chronic Hypertenstion and Advanced Maternal Age >40 years - NST only  Fetal Heart Rate A Mode: External Baseline Rate (A): 140 bpm Variability: Moderate Accelerations: 15 x 15 Decelerations: None Multiple birth?: No  Uterine Activity Mode: Palpation, Toco Contraction Frequency (min): none noted Resting Tone Palpated: Relaxed  Interpretation (Fetal Testing) Nonstress Test Interpretation: Reactive Comments: Reviewed with Dr. Arnie Bibber

## 2023-12-06 NOTE — Progress Notes (Signed)
 Patient was seen on 12/12/2023 for Gestational Diabetes self-management class at the [facility]; Pt arrived to class late. The following learning objectives were met by the patient during this course:    States the definition of Gestational Diabetes States why dietary management is important in controlling blood glucose Describes the effects each nutrient has on blood glucose levels Demonstrates ability to create a balanced meal plan Demonstrates carbohydrate counting  States when to check blood glucose levels Demonstrates proper blood glucose monitoring techniques States the effect of stress and exercise on blood glucose levels States the importance of limiting caffeine and abstaining from alcohol and smoking   Patient has a meter prior to visit. Patient is instructed to testing pre breakfast and 2 hours after each meal. Blood glucose today in class 91 mg/dL, reported as ~ 2 hour post prandial per pt reporting  Patient instructed to monitor glucose levels: QID FBS: 60 - <90 1 hour: <140 2 hour: <120  *Patient received handouts: Nutrition Diabetes and Pregnancy Carbohydrate Counting List Blood glucose log Snack ideas for diabetes during pregnancy  Patient will be seen for follow-up as needed.

## 2023-12-10 NOTE — Progress Notes (Unsigned)
   PRENATAL VISIT NOTE  Subjective:  Tammy Mcgrath is a 43 y.o. [id number] at [redacted]w[redacted]d being seen today for ongoing prenatal care.  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  Patient reports {[id number]}.   .  .   . Denies leaking of fluid.   The following portions of the patient's history were reviewed and updated as appropriate: allergies, current medications, past family history, past medical history, past social history, past surgical history and problem list.   Objective:  There were no vitals filed for this visit.  Fetal Status:           General:  Alert, oriented and cooperative. Patient is in no acute distress.  Skin: Skin is warm and dry. No rash noted.   Cardiovascular: Normal heart rate noted  Respiratory: Normal respiratory effort, no problems with respiration noted  Abdomen: Soft, gravid, appropriate for gestational age.        Pelvic: Cervical exam deferred        Extremities: Normal range of motion.     Mental Status: Normal mood and affect. Normal behavior. Normal judgment and thought content.   Assessment and Plan:  Pregnancy: [id number] at [redacted]w[redacted]d 1. Supervision of high risk pregnancy, antepartum   2. Diet controlled gestational diabetes mellitus (GDM) in third trimester (Primary)  4/17 u/s normal afi, normal growth -Serial growth, Antenatal testing to start around 32 weeks  -Delivery around [redacted] weeks gestation or sooner if indicated   3. Multigravida of advanced maternal age in third trimester Following MFM  4.  Chronic hypertension in pregnancy On procardia  60 mg daily Continue ASA  5. Maternal iron  deficiency anemia affecting pregnancy in second trimester, antepartum S/p iv iron   6. [redacted] weeks gestation of pregnancy   Preterm labor symptoms and general obstetric precautions including but not limited to vaginal bleeding, contractions, leaking of fluid and fetal movement were reviewed in detail with the patient. Please refer to After Visit Summary for other counseling recommendations.   No follow-ups on file.  Future Appointments  Date Time Provider Department Center  12/11/2023 10:35 AM Zelma Hidden, FNP [facility] None  12/12/2023  9:05 AM [facility] GDM CLASS [facility] NDM  12/14/2023  8:30 AM [facility] PROVIDER 1 [facility]  12/14/2023  8:45 AM [facility] NST [facility]  12/20/2023  8:30 AM [facility] PROVIDER 1 [facility]  12/20/2023  8:45 AM [facility] NST [facility]  12/26/2023 10:15 AM Dunn, Jodelle Mungo, MD [facility] None  12/27/2023  8:00 AM [facility] PROVIDER 1 [facility]  12/27/2023  8:30 AM [facility] US4 [facility]  01/09/2024 10:35 AM Malka Sea, DO [facility] None  01/16/2024 10:15 AM Malka Sea, DO [facility] None  01/23/2024 10:55 AM Alto Atta, Jodelle Mungo, MD [facility] None  01/30/2024 10:35 AM Alto Atta, Jodelle Mungo, MD [facility] None  02/29/2024  9:20 AM Tobb, Kardie, DO [facility]  04/28/2024  3:00 PM Zarwolo, Gloria, FNP [facility]    Susi Eric, FNP

## 2023-12-11 ENCOUNTER — Ambulatory Visit (INDEPENDENT_AMBULATORY_CARE_PROVIDER_SITE_OTHER): Admitting: Obstetrics and Gynecology

## 2023-12-11 VITALS — BP 135/86 | HR 78 | Wt 191.0 lb

## 2023-12-11 DIAGNOSIS — D509 Iron deficiency anemia, unspecified: Secondary | ICD-10-CM

## 2023-12-11 DIAGNOSIS — O10919 Unspecified pre-existing hypertension complicating pregnancy, unspecified trimester: Secondary | ICD-10-CM

## 2023-12-11 DIAGNOSIS — O10913 Unspecified pre-existing hypertension complicating pregnancy, third trimester: Secondary | ICD-10-CM

## 2023-12-11 DIAGNOSIS — O99013 Anemia complicating pregnancy, third trimester: Secondary | ICD-10-CM | POA: Diagnosis not present

## 2023-12-11 DIAGNOSIS — O0990 Supervision of high risk pregnancy, unspecified, unspecified trimester: Secondary | ICD-10-CM

## 2023-12-11 DIAGNOSIS — O09523 Supervision of elderly multigravida, third trimester: Secondary | ICD-10-CM | POA: Diagnosis not present

## 2023-12-11 DIAGNOSIS — O24410 Gestational diabetes mellitus in pregnancy, diet controlled: Secondary | ICD-10-CM | POA: Diagnosis not present

## 2023-12-11 DIAGNOSIS — Z3A32 32 weeks gestation of pregnancy: Secondary | ICD-10-CM

## 2023-12-11 MED ORDER — DEXCOM G7 SENSOR MISC: 3 refills | Status: DC

## 2023-12-12 ENCOUNTER — Encounter: Attending: Obstetrics and Gynecology | Admitting: Dietician

## 2023-12-12 DIAGNOSIS — O24410 Gestational diabetes mellitus in pregnancy, diet controlled: Secondary | ICD-10-CM | POA: Insufficient documentation

## 2023-12-14 ENCOUNTER — Ambulatory Visit (HOSPITAL_BASED_OUTPATIENT_CLINIC_OR_DEPARTMENT_OTHER): Admitting: *Deleted

## 2023-12-14 ENCOUNTER — Ambulatory Visit: Attending: Maternal & Fetal Medicine | Admitting: Maternal & Fetal Medicine

## 2023-12-14 VITALS — BP 124/88 | HR 88

## 2023-12-14 DIAGNOSIS — O10013 Pre-existing essential hypertension complicating pregnancy, third trimester: Secondary | ICD-10-CM | POA: Insufficient documentation

## 2023-12-14 DIAGNOSIS — O24410 Gestational diabetes mellitus in pregnancy, diet controlled: Secondary | ICD-10-CM

## 2023-12-14 DIAGNOSIS — Z3A33 33 weeks gestation of pregnancy: Secondary | ICD-10-CM

## 2023-12-14 DIAGNOSIS — Z148 Genetic carrier of other disease: Secondary | ICD-10-CM | POA: Diagnosis not present

## 2023-12-14 DIAGNOSIS — O09523 Supervision of elderly multigravida, third trimester: Secondary | ICD-10-CM | POA: Insufficient documentation

## 2023-12-14 DIAGNOSIS — O99012 Anemia complicating pregnancy, second trimester: Secondary | ICD-10-CM

## 2023-12-14 DIAGNOSIS — D509 Iron deficiency anemia, unspecified: Secondary | ICD-10-CM

## 2023-12-14 DIAGNOSIS — O10913 Unspecified pre-existing hypertension complicating pregnancy, third trimester: Secondary | ICD-10-CM

## 2023-12-14 DIAGNOSIS — O10919 Unspecified pre-existing hypertension complicating pregnancy, unspecified trimester: Secondary | ICD-10-CM | POA: Diagnosis not present

## 2023-12-14 DIAGNOSIS — D259 Leiomyoma of uterus, unspecified: Secondary | ICD-10-CM

## 2023-12-14 DIAGNOSIS — D573 Sickle-cell trait: Secondary | ICD-10-CM

## 2023-12-14 DIAGNOSIS — O99019 Anemia complicating pregnancy, unspecified trimester: Secondary | ICD-10-CM

## 2023-12-14 NOTE — Procedures (Addendum)
 Tammy Mcgrath June 16, 1981 [redacted]w[redacted]d  Fetus A Non-Stress Test Interpretation for 12/14/23-NST only  Indication: Chronic Hypertenstion and Advanced Maternal Age >40 years  Fetal Heart Rate A Mode: External Baseline Rate (A): 135 bpm Variability: Moderate Accelerations: 15 x 15 Decelerations: None Multiple birth?: No  Uterine Activity Mode: Toco Contraction Frequency (min): none Resting Tone Palpated: Relaxed  Interpretation (Fetal Testing) Nonstress Test Interpretation: Reactive Comments: Tracing reviewed by Dr. Grayland Le  [name] has been followed due to advanced maternal age, chronic hypertension treated with nifedipine , and recently diagnosed diet-controlled gestational diabetes.    She reports that her fasting fingersticks are elevated in the low 100s to the 120s range.    She was advised that the goals for her fingerstick values are in the range of between 90-95 and her 2-hour postprandial fingerstick values are 120 or less.    Should her fasting blood glucose values remain elevated, she may need to be started on metformin at bedtime to help lower the fasting fingerstick values.    She will return in 1 week for another NST.

## 2023-12-14 NOTE — Addendum Note (Signed)
 Addended by: Sharla Davis on: 12/14/2023 10:05 AM   Modules accepted: Level of Service

## 2023-12-17 NOTE — Progress Notes (Signed)
 After review, MFM consult with provider is not indicated for today  Tammy Bowling, DO 12/17/2023 4:19 PM  [facility]

## 2023-12-20 ENCOUNTER — Ambulatory Visit

## 2023-12-26 ENCOUNTER — Encounter: Payer: Self-pay | Admitting: Family Medicine

## 2023-12-26 ENCOUNTER — Other Ambulatory Visit (HOSPITAL_COMMUNITY): Payer: Self-pay

## 2023-12-26 ENCOUNTER — Ambulatory Visit (INDEPENDENT_AMBULATORY_CARE_PROVIDER_SITE_OTHER): Admitting: Obstetrics and Gynecology

## 2023-12-26 VITALS — BP 133/94 | HR 99 | Wt 193.0 lb

## 2023-12-26 DIAGNOSIS — O0990 Supervision of high risk pregnancy, unspecified, unspecified trimester: Secondary | ICD-10-CM

## 2023-12-26 DIAGNOSIS — O10919 Unspecified pre-existing hypertension complicating pregnancy, unspecified trimester: Secondary | ICD-10-CM | POA: Diagnosis not present

## 2023-12-26 DIAGNOSIS — O99019 Anemia complicating pregnancy, unspecified trimester: Secondary | ICD-10-CM

## 2023-12-26 DIAGNOSIS — O24410 Gestational diabetes mellitus in pregnancy, diet controlled: Secondary | ICD-10-CM | POA: Diagnosis not present

## 2023-12-26 DIAGNOSIS — D509 Iron deficiency anemia, unspecified: Secondary | ICD-10-CM

## 2023-12-26 NOTE — Progress Notes (Signed)
 PRENATAL VISIT NOTE  Subjective:  Tammy Mcgrath is a 43 y.o. [id number] at [redacted]w[redacted]d being seen today for ongoing prenatal care.  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  Patient reports no complaints.  Contractions: Not present. Vag. Bleeding: None.  Movement: Present. Denies leaking of fluid.   Did not take BP med this morning yet. Bps largely within normal limits at home per pt Reports fasting BS 100-110, rarely 90-95. Postprandials are within normal limits.    The following portions of the patient's history were reviewed and updated as appropriate: allergies, current medications, past family history, past medical history, past social history, past surgical history and problem list.   Objective:   Vitals:   12/26/23 1027 12/26/23 1039  BP: (!) 119/90 (!) 133/94  Pulse: 99   Weight: 193 lb (87.5 kg)    Body mass index is 28.5 kg/m. Total weight gain: 28 lb (12.7 kg)   Fetal Status: Fetal Heart Rate (bpm): 146   Movement: Present     General:  Alert, oriented and cooperative. Patient is in no acute distress.  Skin: Skin is warm and dry. No rash noted.   Cardiovascular: Normal heart rate noted  Respiratory: Normal respiratory effort, no problems with respiration noted  Abdomen: Soft, gravid, appropriate for gestational age.  Pain/Pressure: Present     Pelvic: Cervical exam deferred        Extremities: Normal range of motion.  Edema: Trace  Mental  Status: Normal mood and affect. Normal behavior. Normal judgment and thought content.   Assessment and Plan:  Pregnancy: [id number] at 104w5d 1. Supervision of high risk pregnancy, antepartum (Primary) Routine care  2. Chronic hypertension in pregnancy Good control on procardia  60 mg daily Go home and take BP med, recheck BP in 1 hr, if not improved/worse, to MAU Last growth 4/17, EFW 34%, AFI normal Serial growth & antenatal testing Next growth tomorrow Delivery at 38 wks  3. Diet controlled gestational diabetes mellitus (GDM) in third trimester Suboptimal control for fastings. Reviewed risks of uncontrolled blood sugars including maternal/fetal/neonatal complications, macrosomia, stillbirth. Recommend insulin at bedtime, she declines. Recommend metformin at bedtime, she declines. Counseled on risks of complications. She will consider. Dietary guidance provided.  4. Iron  deficiency anemia during pregnancy S/p IV iron , recheck today - CBC - Ferritin    Preterm labor symptoms and general obstetric precautions including but not limited to vaginal bleeding, contractions, leaking of fluid and fetal movement were reviewed in detail with the patient. Please refer to After Visit Summary for other counseling recommendations.   Return in about 2 weeks (around 01/09/2024).  Future Appointments  Date Time Provider Department Center  12/27/2023  8:00 AM [facility] PROVIDER 1 [facility]  12/27/2023  8:30 AM [facility] US4 [facility]  01/09/2024 10:35 AM Malka Sea, DO [facility] None  01/16/2024 10:15 AM Malka Sea, DO [facility] None  01/23/2024 10:55 AM Alto Atta, Jodelle Mungo, MD [facility] None  01/30/2024 10:35 AM [place] Atta, Jodelle Mungo, MD [facility] None  02/29/2024  9:20 AM Emmette Harms, Kardie, DO [facility]  04/28/2024  3:00 PM Zarwolo, Gloria,  FNP [facility]    Marci Setter, MD

## 2023-12-27 ENCOUNTER — Other Ambulatory Visit: Payer: Self-pay | Admitting: *Deleted

## 2023-12-27 ENCOUNTER — Ambulatory Visit (HOSPITAL_BASED_OUTPATIENT_CLINIC_OR_DEPARTMENT_OTHER): Admitting: Obstetrics and Gynecology

## 2023-12-27 ENCOUNTER — Ambulatory Visit: Attending: Obstetrics and Gynecology

## 2023-12-27 VITALS — BP 131/90 | HR 97

## 2023-12-27 DIAGNOSIS — O10013 Pre-existing essential hypertension complicating pregnancy, third trimester: Secondary | ICD-10-CM | POA: Diagnosis not present

## 2023-12-27 DIAGNOSIS — Z148 Genetic carrier of other disease: Secondary | ICD-10-CM | POA: Insufficient documentation

## 2023-12-27 DIAGNOSIS — O24410 Gestational diabetes mellitus in pregnancy, diet controlled: Secondary | ICD-10-CM | POA: Diagnosis not present

## 2023-12-27 DIAGNOSIS — O09523 Supervision of elderly multigravida, third trimester: Secondary | ICD-10-CM

## 2023-12-27 DIAGNOSIS — O10919 Unspecified pre-existing hypertension complicating pregnancy, unspecified trimester: Secondary | ICD-10-CM

## 2023-12-27 DIAGNOSIS — O10913 Unspecified pre-existing hypertension complicating pregnancy, third trimester: Secondary | ICD-10-CM | POA: Diagnosis present

## 2023-12-27 DIAGNOSIS — D509 Iron deficiency anemia, unspecified: Secondary | ICD-10-CM | POA: Insufficient documentation

## 2023-12-27 DIAGNOSIS — Z3A34 34 weeks gestation of pregnancy: Secondary | ICD-10-CM

## 2023-12-27 DIAGNOSIS — O99012 Anemia complicating pregnancy, second trimester: Secondary | ICD-10-CM

## 2023-12-27 LAB — FERRITIN: Ferritin: 52 ng/mL (ref 15–150)

## 2023-12-27 NOTE — Progress Notes (Signed)
 After review, MFM consult with provider is not indicated for today  Cassandria Clever, MD 12/27/2023 10:39 AM  [facility]

## 2023-12-28 ENCOUNTER — Telehealth: Payer: Self-pay

## 2023-12-28 NOTE — Telephone Encounter (Signed)
 Scheduled NST only for patient on 5/22 per Dr. Arnie Bibber.

## 2023-12-28 NOTE — Telephone Encounter (Signed)
-----   Message from Gloster C sent at 12/28/2023  8:01 AM EDT ----- Regarding: NST Good morning  Patient is to have an NST next week per Dr. Arnie Bibber.  Totti

## 2024-01-01 ENCOUNTER — Encounter: Payer: Self-pay | Admitting: Family Medicine

## 2024-01-01 ENCOUNTER — Inpatient Hospital Stay (HOSPITAL_COMMUNITY)
Admission: AD | Admit: 2024-01-01 | Discharge: 2024-01-01 | Disposition: A | Discharge status: Home / Self Care | Attending: Obstetrics and Gynecology | Admitting: Obstetrics and Gynecology

## 2024-01-01 ENCOUNTER — Other Ambulatory Visit: Payer: Self-pay

## 2024-01-01 DIAGNOSIS — O99013 Anemia complicating pregnancy, third trimester: Secondary | ICD-10-CM | POA: Diagnosis not present

## 2024-01-01 DIAGNOSIS — O09523 Supervision of elderly multigravida, third trimester: Secondary | ICD-10-CM | POA: Insufficient documentation

## 2024-01-01 DIAGNOSIS — O99343 Other mental disorders complicating pregnancy, third trimester: Secondary | ICD-10-CM | POA: Insufficient documentation

## 2024-01-01 DIAGNOSIS — O24410 Gestational diabetes mellitus in pregnancy, diet controlled: Secondary | ICD-10-CM | POA: Diagnosis not present

## 2024-01-01 DIAGNOSIS — O10913 Unspecified pre-existing hypertension complicating pregnancy, third trimester: Secondary | ICD-10-CM

## 2024-01-01 DIAGNOSIS — Z3A35 35 weeks gestation of pregnancy: Secondary | ICD-10-CM | POA: Diagnosis not present

## 2024-01-01 DIAGNOSIS — D573 Sickle-cell trait: Secondary | ICD-10-CM | POA: Insufficient documentation

## 2024-01-01 DIAGNOSIS — I10 Essential (primary) hypertension: Secondary | ICD-10-CM

## 2024-01-01 DIAGNOSIS — R519 Headache, unspecified: Secondary | ICD-10-CM

## 2024-01-01 DIAGNOSIS — F32A Depression, unspecified: Secondary | ICD-10-CM | POA: Insufficient documentation

## 2024-01-01 LAB — CBC WITH DIFFERENTIAL/PLATELET
Abs Immature Granulocytes: 0.04 10*3/uL (ref 0.00–0.07)
Basophils Absolute: 0 10*3/uL (ref 0.0–0.1)
Basophils Relative: 1 %
Eosinophils Absolute: 0.1 10*3/uL (ref 0.0–0.5)
Eosinophils Relative: 2 %
HCT: 34.8 % — ABNORMAL LOW (ref 36.0–46.0)
Hemoglobin: 12 g/dL (ref 12.0–15.0)
Immature Granulocytes: 1 %
Lymphocytes Relative: 19 %
Lymphs Abs: 1 10*3/uL (ref 0.7–4.0)
MCH: 28.4 pg (ref 26.0–34.0)
MCHC: 34.5 g/dL (ref 30.0–36.0)
MCV: 82.3 fL (ref 80.0–100.0)
Monocytes Absolute: 0.4 10*3/uL (ref 0.1–1.0)
Monocytes Relative: 8 %
Neutro Abs: 3.9 10*3/uL (ref 1.7–7.7)
Neutrophils Relative %: 69 %
Platelets: 187 10*3/uL (ref 150–400)
RBC: 4.23 MIL/uL (ref 3.87–5.11)
RDW: 20.6 % — ABNORMAL HIGH (ref 11.5–15.5)
WBC: 5.6 10*3/uL (ref 4.0–10.5)
nRBC: 0 % (ref 0.0–0.2)

## 2024-01-01 LAB — COMPREHENSIVE METABOLIC PANEL WITH GFR
ALT: 14 U/L (ref 0–44)
AST: 28 U/L (ref 15–41)
Albumin: 2.4 g/dL — ABNORMAL LOW (ref 3.5–5.0)
Alkaline Phosphatase: 103 U/L (ref 38–126)
Anion gap: 10 (ref 5–15)
BUN: 8 mg/dL (ref 6–20)
CO2: 17 mmol/L — ABNORMAL LOW (ref 22–32)
Calcium: 8.6 mg/dL — ABNORMAL LOW (ref 8.9–10.3)
Chloride: 107 mmol/L (ref 98–111)
Creatinine, Ser: 0.65 mg/dL (ref 0.44–1.00)
GFR, Estimated: >60 mL/min (ref >60)
Glucose, Bld: 145 mg/dL — ABNORMAL HIGH (ref 70–99)
Potassium: 3.5 mmol/L (ref 3.5–5.1)
Sodium: 134 mmol/L — ABNORMAL LOW (ref 135–145)
Total Bilirubin: 0.3 mg/dL (ref 0.0–1.2)
Total Protein: 5.6 g/dL — ABNORMAL LOW (ref 6.5–8.1)

## 2024-01-01 LAB — PROTEIN / CREATININE RATIO, URINE
Creatinine, Urine: 35 mg/dL
Total Protein, Urine: <6 mg/dL

## 2024-01-01 MED ORDER — CYCLOBENZAPRINE HCL 5 MG PO TABS
5.0000 mg | ORAL_TABLET | Freq: Three times a day (TID) | ORAL | Status: DC | PRN
  Administered 2024-01-01: 5 mg via ORAL
  Filled 2024-01-01: qty 1

## 2024-01-01 MED ORDER — ACETAMINOPHEN-CAFFEINE 500-65 MG PO TABS
2.0000 | ORAL_TABLET | Freq: Once | ORAL | Status: AC
  Administered 2024-01-01: 2 via ORAL
  Filled 2024-01-01: qty 2

## 2024-01-01 MED ORDER — NIFEDIPINE 10 MG PO CAPS: 20.0000 mg | ORAL_CAPSULE | ORAL | Status: DC | PRN

## 2024-01-01 MED ORDER — NIFEDIPINE 10 MG PO CAPS: 10.0000 mg | ORAL_CAPSULE | ORAL | Status: DC | PRN

## 2024-01-01 MED ORDER — LABETALOL HCL 5 MG/ML IV SOLN: 40.0000 mg | INTRAVENOUS | Status: DC | PRN

## 2024-01-01 NOTE — MAU Provider Note (Addendum)
 MAU Provider Note  Chief Complaint: Hypertension and Headache  SUBJECTIVE HPI: Tammy Mcgrath is a 43 y.o. [id number] at [redacted]w[redacted]d by early ultrasound who presents to maternity admissions reporting severe range BP at home with an associated headache.  Pregnancy c/b AMA, CHTN on Procardia  60mg  BID, hearing loss, depression, A1GDM, Sickle cell trait.   Receives [facility] with [facility] [place].   HPI  Past Medical History:  Diagnosis Date   Atopic dermatitis 04/11/2022   Chronic hypertension    Chronic migraine without aura, with intractable migraine, so stated, with status migrainosus 03/26/2022   Depression    not a formal dx, "is sad, not suicidal"   Eczema    Fibroid    Headache    Maternal iron  deficiency anemia affecting pregnancy in second trimester, antepartum 11/02/2023   Migraines    Otitis externa of both ears 12/07/2022   Sickle cell trait (HCC)    UTI (urinary tract infection)    Past Surgical History:  Procedure Laterality Date   BREAST SURGERY     reduction   DILATION AND CURETTAGE OF UTERUS     Social History   Socioeconomic History   Marital status: Single    Spouse name: Not on file   Number of children: Not on file   Years of education: Not on file   Highest education level: Not on file  Occupational History   Not on file  Tobacco Use   Smoking status: Never   Smokeless tobacco: Never  Vaping Use   Vaping status: Never Used  Substance and Sexual Activity   Alcohol use: Not Currently   Drug use: Not Currently    Types: Marijuana    Comment: last was ~3 months ago   Sexual activity: Not Currently  Other Topics Concern   Not on file  Social History Narrative   Not on file   Social Drivers of Health   Financial Resource Strain: Not on file  Food Insecurity: No Food Insecurity (01/01/2024)   Hunger Vital Sign    Worried About Running Out of Food in the Last Year: Never true    Ran Out of Food in the Last Year: Never true  Transportation Needs: No  Transportation Needs (01/01/2024)   PRAPARE - Administrator, Civil Service (Medical): No    Lack of Transportation (Non-Medical): No  Physical Activity: Not on file  Stress: Not on file  Social Connections: Not on file  Intimate Partner Violence: Not At Risk (01/01/2024)   Humiliation, Afraid, Rape, and Kick questionnaire    Fear of Current or Ex-Partner: No    Emotionally Abused: No    Physically Abused: No    Sexually Abused: No   No current facility-administered medications on file prior to encounter.   Current Outpatient Medications on File Prior to Encounter  Medication Sig Dispense Refill   Accu-Chek Softclix Lancets lancets Use as instructed 100 each 12   aspirin  EC 81 MG tablet Take 2 tablets (162 mg total) by mouth at bedtime. Start taking when you are [redacted] weeks pregnant for rest of pregnancy for prevention of preeclampsia 300 tablet 2   glucose blood test strip Use as instructed 100 each 12   NIFEdipine  (PROCARDIA  XL/NIFEDICAL XL) 60 MG 24 hr tablet Take 1 tablet (60 mg total) by mouth 2 (two) times daily. 90 tablet 0   Prenatal Vit-Fe Fumarate-FA (PRENATAL VITAMIN PO) Take by mouth.     Blood Glucose Monitoring Suppl (ACCU-CHEK GUIDE) w/Device KIT 1 Device  by Does not apply route 4 (four) times daily. 1 kit 0   Continuous Glucose Sensor (DEXCOM G7 SENSOR) MISC Place one sensor on every 10 days 3 each 3   No Known Allergies  ROS:  Pertinent positives/negatives listed above.  I have reviewed patient's Past Medical Hx, Surgical Hx, Family Hx, Social Hx, medications and allergies.   Physical Exam  Patient Vitals for the past 24 hrs:  BP Temp Temp src Pulse Resp SpO2  01/01/24 1541 119/73 98 F (36.7 C) Oral 94 16 99 %  01/01/24 1429 (!) 131/93 -- -- (!) 110 -- --  01/01/24 1400 138/85 -- -- (!) 103 -- --  01/01/24 1343 136/86 -- -- (!) 101 -- 98 %  01/01/24 1326 (!) 144/94 98.2 F (36.8 C) Oral (!) 104 16 98 %   Constitutional: Well-developed,  well-nourished female in no acute distress  Cardiovascular: normal rate Respiratory: normal effort GI: Abd soft, non-tender MS: Extremities nontender, no edema, normal ROM Neurologic: Alert and oriented x 4  GU: Neg CVAT.  FHT:  Baseline 150, moderate variability, accelerations present, no decelerations Contractions: none  LAB RESULTS Results for orders placed or performed during the hospital encounter of 01/01/24 (from the past 24 hours)  Protein / creatinine ratio, urine     Status: None   Collection Time: 01/01/24  1:12 PM  Result Value Ref Range   Creatinine, Urine 35 mg/dL   Total Protein, Urine <6 mg/dL   Protein Creatinine Ratio        0.00 - 0.15 mg/mg[Cre]  Comprehensive metabolic panel     Status: Abnormal   Collection Time: 01/01/24  1:51 PM  Result Value Ref Range   Sodium 134 (L) 135 - 145 mmol/L   Potassium 3.5 3.5 - 5.1 mmol/L   Chloride 107 98 - 111 mmol/L   CO2 17 (L) 22 - 32 mmol/L   Glucose, Bld 145 (H) 70 - 99 mg/dL   BUN 8 6 - 20 mg/dL   Creatinine, Ser 1.61 0.44 - 1.00 mg/dL   Calcium 8.6 (L) 8.9 - 10.3 mg/dL   Total Protein 5.6 (L) 6.5 - 8.1 g/dL   Albumin 2.4 (L) 3.5 - 5.0 g/dL   AST 28 15 - 41 U/L   ALT 14 0 - 44 U/L   Alkaline Phosphatase 103 38 - 126 U/L   Total Bilirubin 0.3 0.0 - 1.2 mg/dL   GFR, Estimated >09 >60 mL/min   Anion gap 10 5 - 15  CBC with Differential/Platelet     Status: Abnormal   Collection Time: 01/01/24  1:51 PM  Result Value Ref Range   WBC 5.6 4.0 - 10.5 K/uL   RBC 4.23 3.87 - 5.11 MIL/uL   Hemoglobin 12.0 12.0 - 15.0 g/dL   HCT 45.4 (L) 09.8 - 11.9 %   MCV 82.3 80.0 - 100.0 fL   MCH 28.4 26.0 - 34.0 pg   MCHC 34.5 30.0 - 36.0 g/dL   RDW 14.7 (H) 82.9 - 56.2 %   Platelets 187 150 - 400 K/uL   nRBC 0.0 0.0 - 0.2 %   Neutrophils Relative % 69 %   Neutro Abs 3.9 1.7 - 7.7 K/uL   Lymphocytes Relative 19 %   Lymphs Abs 1.0 0.7 - 4.0 K/uL   Monocytes Relative 8 %   Monocytes Absolute 0.4 0.1 - 1.0 K/uL   Eosinophils  Relative 2 %   Eosinophils Absolute 0.1 0.0 - 0.5 K/uL   Basophils Relative 1 %  Basophils Absolute 0.0 0.0 - 0.1 K/uL   Immature Granulocytes 1 %   Abs Immature Granulocytes 0.04 0.00 - 0.07 K/uL    B/Positive/-- (11/20 1540)  IMAGING US  MFM OB FOLLOW UP Result Date: 12/27/2023 ----------------------------------------------------------------------  OBSTETRICS REPORT                       (Signed Final 12/27/2023 10:42 am) ---------------------------------------------------------------------- Patient Info  ID #:       [id number]                          D.O.B.:  03/22/1981 (42 yrs)(F)  Name:       Tammy Kand Tarlton                Visit Date: 12/27/2023 08:36 am ---------------------------------------------------------------------- Performed By  Attending:        Cassandria Clever MD        Ref. Address:     [address]                                                             [address]  Performed By:     Marybeth Smock         [facility]                                  [facility]  Referred By:      [facility] [place] ---------------------------------------------------------------------- Orders  #  Description                           Code        Ordered By  1  US  MFM OB FOLLOW UP                   76816.01    BURK SCHAIBLE  2  US  MFM FETAL BPP WO NON               78295.62    BURK SCHAIBLE     STRESS ----------------------------------------------------------------------  #  Order #                     Accession #  Episode #  1  [id number]                   [id number]                 [id number]  2  [id number]                   [id number]                 [id number] ---------------------------------------------------------------------- Indications  [redacted] weeks gestation of pregnancy                Z3A.60  Advanced  maternal age multigravida 3+,        O75.523  third trimester (42)  Hypertension - Chronic/Pre-existing            O10.019  (procardia )  History of sickle cell trait (FOB also a carrier) Z86.2  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 Cord Insertion:      Previously seen  Amniotic Fluid  AFI FV:      Within normal limits  AFI Sum(cm)     %Tile       Largest Pocket(cm)  9.08            13          4.02  RUQ(cm)       RLQ(cm)       LUQ(cm)        LLQ(cm)  0             2.36          2.7            4.02 ---------------------------------------------------------------------- Biophysical Evaluation  Amniotic F.V:   Within normal limits       F. Tone:        Observed  F. Movement:    Observed                   Score:          8/8  F. Breathing:   Observed ---------------------------------------------------------------------- Biometry  BPD:      88.1  mm     G. Age:  35w 4d         73  %    CI:        78.15   %    70 - 86                                                          FL/HC:      21.9   %    20.1 - 22.3  HC:      315.3  mm  G. Age:  35w 3d         28  %    HC/AC:      1.01        0.93 - 1.11  AC:      313.5  mm     G. Age:  35w 2d         69  %    FL/BPD:     78.3   %    71 - 87  FL:         69  mm     G. Age:  35w 3d         58  %    FL/AC:      22.0   %    20 - 24  LV:        2.8  mm  Est. FW:    2659  gm    5 lb 14 oz      61  % ---------------------------------------------------------------------- OB History  Gravidity:    7         Term:   2        Prem:   0         SAB:   4  TOP:          0       Ectopic:  0        Living: 2 ---------------------------------------------------------------------- Gestational Age  LMP:           33w 1d        Date:  05/09/23                  EDD:   02/13/24  U/S Today:     35w 3d                                        EDD:   01/28/24  Best:          34w 6d     Det. By:  Delphine Fiedler         EDD:   02/01/24                                      (06/20/23) ---------------------------------------------------------------------- Anatomy  Ventricles:            Appears normal         Stomach:                Appears normal, left                                                                        sided  Heart:                 Appears normal         Kidneys:                Appear normal                         (  4CH, axis, and                         situs)  Diaphragm:             Appears normal         Bladder:                Appears normal ---------------------------------------------------------------------- Cervix Uterus Adnexa  Cervix  Not visualized (advanced GA >24wks)  Uterus  Single fibroid noted, see table below.  Right Ovary  Not visualized.  Left Ovary  Not visualized.  Cul De Sac  No free fluid seen.  Adnexa  No abnormality visualized ---------------------------------------------------------------------- Myomas  Site                     L(cm)      W(cm)      D(cm)       Location  Mid anterior             3.85       3.18       4.36 ----------------------------------------------------------------------  Blood Flow                  RI       PI       Comments ---------------------------------------------------------------------- Impression  Let goChronic hypertension.  Patient takes nifedipine  XL 60  mg twice daily.  Blood pressure today at our office is 131/90  mmHg.  She does not have signs and symptoms of severe  features of preeclampsia.  Fetal growth is appropriate for gestational age.  Amniotic fluid  is normal good fetal activity seen.   Cephalic presentation.  Antenatal testing is reassuring.  BPP 8/8. ---------------------------------------------------------------------- Recommendations  Continue weekly antenatal testing till delivery . ----------------------------------------------------------------------                 Cassandria Clever, MD Electronically Signed Final Report   12/27/2023 10:42 am ----------------------------------------------------------------------   US  MFM FETAL BPP WO NON STRESS Result Date: 12/27/2023 ----------------------------------------------------------------------  OBSTETRICS REPORT                       (Signed Final 12/27/2023 10:42 am) ---------------------------------------------------------------------- Patient Info  ID #:       [id number]                          D.O.B.:  12-28-1980 (42 yrs)(F)  Name:       Tammy Mcgrath                Visit Date: 12/27/2023 08:36 am ---------------------------------------------------------------------- Performed By  Attending:        Cassandria Clever MD        Ref. Address:     [address]  Performed By:     Marybeth Smock         [facility]  Referred By:      [facility] [place] ---------------------------------------------------------------------- Orders  #  Description                           Code        Ordered By  1  US  MFM OB FOLLOW UP                   [id number]    BURK SCHAIBLE  2  US  MFM FETAL BPP WO NON               76819.01    [facility]     STRESS ----------------------------------------------------------------------  #  Order #                     Accession #                Episode #  1  [id number]                   [id number]                 [id number]  2  [id number]                    [id number]                 [id number] ---------------------------------------------------------------------- Indications  [redacted] weeks gestation of pregnancy                Z3A.39  Advanced maternal age multigravida 67+,        O25.523  third trimester (42)  Hypertension - Chronic/Pre-existing            O10.019  (procardia )  History of sickle cell trait (FOB also a carrier) Z86.2  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 Cord Insertion:      Previously seen  Amniotic Fluid  AFI FV:      Within normal limits  AFI Sum(cm)     %Tile       Largest Pocket(cm)  9.08  13          4.02  RUQ(cm)       RLQ(cm)       LUQ(cm)        LLQ(cm)  0             2.36          2.7            4.02 ---------------------------------------------------------------------- Biophysical Evaluation  Amniotic F.V:   Within normal limits       F. Tone:        Observed  F. Movement:    Observed                   Score:          8/8  F. Breathing:   Observed ---------------------------------------------------------------------- Biometry  BPD:      88.1  mm     G. Age:  35w 4d         73  %    CI:        78.15   %    70 - 86                                                          FL/HC:      21.9   %    20.1 - 22.3  HC:      315.3  mm     G. Age:  35w 3d         28  %    HC/AC:      1.01        0.93 - 1.11  AC:      313.5  mm     G. Age:  35w 2d         69  %    FL/BPD:     78.3   %    71 - 87  FL:         69  mm     G. Age:  35w 3d         58  %    FL/AC:      22.0   %    20 - 24  LV:         2.8  mm  Est. FW:    2659  gm    5 lb 14 oz      61  % ---------------------------------------------------------------------- OB History  Gravidity:    7         Term:   2        Prem:   0        SAB:   4  TOP:          0       Ectopic:  0        Living: 2 ---------------------------------------------------------------------- Gestational Age  LMP:           33w 1d        Date:  05/09/23                  EDD:   02/13/24  U/S Today:     35w 3d  EDD:   01/28/24  Best:          34w 6d     Det. By:  Early Ultrasound         EDD:   02/01/24                                      (06/20/23) ---------------------------------------------------------------------- Anatomy  Ventricles:            Appears normal         Stomach:                Appears normal, left                                                                        sided  Heart:                 Appears normal         Kidneys:                Appear normal                         (4CH, axis, and                         situs)  Diaphragm:             Appears normal         Bladder:                Appears normal ---------------------------------------------------------------------- Cervix Uterus Adnexa  Cervix  Not visualized (advanced GA >24wks)  Uterus  Single fibroid noted, see table below.  Right Ovary  Not visualized.  Left Ovary  Not visualized.  Cul De Sac  No free fluid seen.  Adnexa  No abnormality visualized ---------------------------------------------------------------------- Myomas  Site                     L(cm)      W(cm)      D(cm)       Location  Mid anterior             3.85       3.18       4.36 ----------------------------------------------------------------------  Blood Flow                  RI       PI       Comments ---------------------------------------------------------------------- Impression  Let goChronic hypertension.  Patient takes nifedipine  XL 60  mg twice daily.  Blood pressure today at our office  is 131/90  mmHg.  She does not have signs and symptoms of severe  features of preeclampsia.  Fetal growth is appropriate for gestational age.  Amniotic fluid  is normal good fetal activity seen.  Cephalic presentation.  Antenatal testing is reassuring.  BPP 8/8. ---------------------------------------------------------------------- Recommendations  Continue weekly antenatal testing till delivery . ----------------------------------------------------------------------                 Cassandria Clever, MD Electronically Signed Final Report   12/27/2023 10:42 am ----------------------------------------------------------------------  MAU Management/MDM: Orders Placed This Encounter  Procedures   Comprehensive metabolic panel   CBC with Differential/Platelet   Protein / creatinine ratio, urine   Notify physician (specify) Confirmatory reading of BP> 160/110 15 minutes later   Apply Hypertensive Disorders of Pregnancy Care Plan   Measure blood pressure   Discharge patient Discharge disposition: 01-Home or Self Care; Discharge patient date: 01/01/2024    Meds ordered this encounter  Medications   AND Linked Order Group    NIFEdipine  (PROCARDIA ) capsule 10 mg    NIFEdipine  (PROCARDIA ) capsule 20 mg    NIFEdipine  (PROCARDIA ) capsule 20 mg    labetalol  (NORMODYNE ) injection 40 mg   acetaminophen -caffeine (EXCEDRIN TENSION HEADACHE) 500-65 MG per tablet 2 tablet   cyclobenzaprine  (FLEXERIL ) tablet 5 mg     Available prenatal records reviewed.   ASSESSMENT 1. [redacted] weeks gestation of pregnancy   2. Chronic hypertension   3. Acute nonintractable headache, unspecified headache type   Mild range BP during encounter, headache resolved after Tylenol  and flexeril . Labs wnl.   PLAN Discharge home with strict return precautions. Continue prenatal care as scheduled  Allergies as of 01/01/2024   No Known Allergies      Medication List     TAKE these medications    Accu-Chek Guide w/Device Kit 1  Device by Does not apply route 4 (four) times daily.   Accu-Chek Softclix Lancets lancets Use as instructed   aspirin  EC 81 MG tablet Take 2 tablets (162 mg total) by mouth at bedtime. Start taking when you are [redacted] weeks pregnant for rest of pregnancy for prevention of preeclampsia   Dexcom G7 Sensor Misc Place one sensor on every 10 days   glucose blood test strip Use as instructed   NIFEdipine  60 MG 24 hr tablet Commonly known as: PROCARDIA  XL/NIFEDICAL XL Take 1 tablet (60 mg total) by mouth 2 (two) times daily.   PRENATAL VITAMIN PO Take by mouth.        Follow-up Information     [facility] [place] Follow up.   Specialty: Obstetrics and Gynecology Why: Continue routine prenatal care as scheduled Contact information: [address] [place]   [address] [phone]                [place], MD FMOB Fellow, Faculty practice [facility], [facility]  01/01/2024  3:56 PM

## 2024-01-01 NOTE — MAU Note (Signed)
 Tammy Mcgrath is a 44 y.o. at [redacted]w[redacted]d here in MAU reporting elevated blood pressures of 170s/100s and a headache rated at a 5/10. She reports taken her procardia  this morning at approx.1030. She retook her BP at approx 1230 where it was155/105. She denies visual changes, RUQ pain, and edema. She further denies LOF and VB. She reports active fetal movement and occasional contractions primarily at night.   Onset of complaint: 1030   Pain score: 5/10, HA  Vitals:   01/01/24 1326 01/01/24 1343  BP: (!) 144/94 136/86  Pulse: (!) 104 (!) 101  Resp: 16   Temp: 98.2 F (36.8 C)   SpO2: 98%    Doppler: 139 bpm

## 2024-01-03 ENCOUNTER — Ambulatory Visit (INDEPENDENT_AMBULATORY_CARE_PROVIDER_SITE_OTHER)

## 2024-01-03 VITALS — BP 125/95 | HR 117 | Wt 192.0 lb

## 2024-01-03 DIAGNOSIS — O09523 Supervision of elderly multigravida, third trimester: Secondary | ICD-10-CM

## 2024-01-03 DIAGNOSIS — Z3689 Encounter for other specified antenatal screening: Secondary | ICD-10-CM

## 2024-01-03 DIAGNOSIS — Z3A35 35 weeks gestation of pregnancy: Secondary | ICD-10-CM | POA: Diagnosis not present

## 2024-01-04 NOTE — Progress Notes (Signed)
 Patient presents for NST. Avi Kerschner l Kymia Simi, CMA

## 2024-01-09 ENCOUNTER — Other Ambulatory Visit (HOSPITAL_COMMUNITY)
Admission: RE | Admit: 2024-01-09 | Discharge: 2024-01-09 | Disposition: A | Discharge status: Home / Self Care | Source: Ambulatory Visit | Attending: Family Medicine | Admitting: Family Medicine

## 2024-01-09 ENCOUNTER — Ambulatory Visit: Admitting: Family Medicine

## 2024-01-09 VITALS — BP 150/98 | HR 98 | Wt 193.0 lb

## 2024-01-09 DIAGNOSIS — O0993 Supervision of high risk pregnancy, unspecified, third trimester: Secondary | ICD-10-CM

## 2024-01-09 DIAGNOSIS — O24410 Gestational diabetes mellitus in pregnancy, diet controlled: Secondary | ICD-10-CM

## 2024-01-09 DIAGNOSIS — O99012 Anemia complicating pregnancy, second trimester: Secondary | ICD-10-CM

## 2024-01-09 DIAGNOSIS — O10919 Unspecified pre-existing hypertension complicating pregnancy, unspecified trimester: Secondary | ICD-10-CM

## 2024-01-09 DIAGNOSIS — D509 Iron deficiency anemia, unspecified: Secondary | ICD-10-CM

## 2024-01-09 DIAGNOSIS — O09523 Supervision of elderly multigravida, third trimester: Secondary | ICD-10-CM | POA: Diagnosis not present

## 2024-01-09 DIAGNOSIS — O10913 Unspecified pre-existing hypertension complicating pregnancy, third trimester: Secondary | ICD-10-CM

## 2024-01-09 DIAGNOSIS — Z3A36 36 weeks gestation of pregnancy: Secondary | ICD-10-CM | POA: Diagnosis not present

## 2024-01-09 DIAGNOSIS — O0990 Supervision of high risk pregnancy, unspecified, unspecified trimester: Secondary | ICD-10-CM | POA: Diagnosis present

## 2024-01-09 NOTE — Progress Notes (Signed)
   PRENATAL VISIT NOTE  Subjective:  Tammy Mcgrath is a 43 y.o. [id number] at [redacted]w[redacted]d being seen today for ongoing prenatal care.  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  Patient reports mild headache.  Contractions: Irritability. Vag. Bleeding: None.  Movement: Present. Denies leaking of fluid.   The following portions of the patient's history were reviewed and updated as appropriate: allergies, current medications, past family history, past medical history, past social history, past surgical history and problem list.   Objective:    Vitals:   01/09/24 1111 01/09/24 1114  BP: (!) 131/99 (!) 150/98  Pulse: (!) 102 98  Weight: 193 lb (87.5 kg)     Fetal Status:  Fetal Heart Rate (bpm): 147   Movement: Present    General: Alert, oriented and cooperative. Patient is in no acute distress.  Skin: Skin is warm and dry. No rash noted.   Cardiovascular: Normal heart rate noted  Respiratory: Normal respiratory effort, no problems with respiration noted  Abdomen: Soft, gravid, appropriate for gestational age.  Pain/Pressure: Present     Pelvic: Cervical exam deferred        Extremities: Normal range of motion.  Edema: Trace  Mental Status: Normal mood and affect. Normal behavior. Normal judgment and thought content.   Assessment and Plan:  Pregnancy: [id number] at [redacted]w[redacted]d 1. [redacted] weeks gestation of pregnancy (Primary)  2. Supervision of high risk pregnancy,  antepartum FHT normal Cultures today  3. Chronic hypertension in pregnancy Increasing BPs. Doubled procardia  last week, but still having elevated BP. Having mild headaches - nothing severe. Had an episode of blurred vision, but nothing else. Due to escalating BP - this is concerning for superimposed GHTN or Preeclampsia. Will schedule for induction at 37 weeks.  4. Multigravida of advanced maternal age in third trimester ASA 81mg   5. Diet controlled gestational diabetes mellitus (GDM) in third trimester  6. Maternal iron  deficiency anemia affecting pregnancy in second trimester, antepartum   Preterm labor symptoms and general obstetric precautions including but not limited to vaginal bleeding, contractions, leaking of fluid and fetal movement were reviewed in detail with the patient. Please refer to After Visit Summary for other counseling recommendations.   No follow-ups on file.  Future Appointments  Date Time Provider Department Center  01/11/2024  9:00 AM [facility] PROVIDER 1 [facility]  01/11/2024  9:30 AM [facility] US7 [facility]  01/16/2024 10:15 AM Malka Sea, DO [facility] None  01/23/2024 10:55 AM Alto Atta, Jodelle Mungo, MD [facility] None  01/30/2024 10:35 AM Alto Atta, Jodelle Mungo, MD [facility] None  02/29/2024  9:20 AM Jerryl Morin, DO [facility] H&V  04/28/2024  3:00 PM Zarwolo, Gloria, FNP [facility] RPC    Lurline Caver J Dewain Platz, DO

## 2024-01-10 ENCOUNTER — Telehealth (HOSPITAL_COMMUNITY): Payer: Self-pay | Admitting: *Deleted

## 2024-01-10 ENCOUNTER — Encounter (HOSPITAL_COMMUNITY): Payer: Self-pay | Admitting: *Deleted

## 2024-01-10 LAB — GC/CHLAMYDIA PROBE AMP (~~LOC~~) NOT AT ARMC
Chlamydia: NEGATIVE
Comment: NEGATIVE
Comment: NORMAL
Neisseria Gonorrhea: NEGATIVE

## 2024-01-10 NOTE — Telephone Encounter (Signed)
 Preadmission screen

## 2024-01-11 ENCOUNTER — Encounter (HOSPITAL_COMMUNITY): Payer: Self-pay | Admitting: Family Medicine

## 2024-01-11 ENCOUNTER — Other Ambulatory Visit

## 2024-01-11 ENCOUNTER — Inpatient Hospital Stay (HOSPITAL_COMMUNITY): Admitting: Anesthesiology

## 2024-01-11 ENCOUNTER — Other Ambulatory Visit: Payer: Self-pay

## 2024-01-11 ENCOUNTER — Ambulatory Visit

## 2024-01-11 ENCOUNTER — Inpatient Hospital Stay (HOSPITAL_COMMUNITY)
Admission: RE | Admit: 2024-01-11 | Discharge: 2024-01-13 | DRG: 768 | Disposition: A | Discharge status: Home / Self Care | Attending: Obstetrics and Gynecology | Admitting: Obstetrics and Gynecology

## 2024-01-11 ENCOUNTER — Inpatient Hospital Stay (HOSPITAL_COMMUNITY)

## 2024-01-11 DIAGNOSIS — D573 Sickle-cell trait: Secondary | ICD-10-CM | POA: Diagnosis present

## 2024-01-11 DIAGNOSIS — O24420 Gestational diabetes mellitus in childbirth, diet controlled: Secondary | ICD-10-CM

## 2024-01-11 DIAGNOSIS — O9902 Anemia complicating childbirth: Secondary | ICD-10-CM | POA: Diagnosis present

## 2024-01-11 DIAGNOSIS — Z3490 Encounter for supervision of normal pregnancy, unspecified, unspecified trimester: Principal | ICD-10-CM | POA: Diagnosis present

## 2024-01-11 DIAGNOSIS — O1404 Mild to moderate pre-eclampsia, complicating childbirth: Secondary | ICD-10-CM

## 2024-01-11 DIAGNOSIS — O0990 Supervision of high risk pregnancy, unspecified, unspecified trimester: Secondary | ICD-10-CM

## 2024-01-11 DIAGNOSIS — Z3A37 37 weeks gestation of pregnancy: Secondary | ICD-10-CM | POA: Diagnosis not present

## 2024-01-11 DIAGNOSIS — O1092 Unspecified pre-existing hypertension complicating childbirth: Secondary | ICD-10-CM | POA: Diagnosis present

## 2024-01-11 DIAGNOSIS — Z833 Family history of diabetes mellitus: Secondary | ICD-10-CM

## 2024-01-11 DIAGNOSIS — O10919 Unspecified pre-existing hypertension complicating pregnancy, unspecified trimester: Secondary | ICD-10-CM

## 2024-01-11 DIAGNOSIS — O114 Pre-existing hypertension with pre-eclampsia, complicating childbirth: Secondary | ICD-10-CM | POA: Diagnosis present

## 2024-01-11 DIAGNOSIS — O4593 Premature separation of placenta, unspecified, third trimester: Secondary | ICD-10-CM | POA: Diagnosis present

## 2024-01-11 DIAGNOSIS — Z8249 Family history of ischemic heart disease and other diseases of the circulatory system: Secondary | ICD-10-CM

## 2024-01-11 DIAGNOSIS — I10 Essential (primary) hypertension: Secondary | ICD-10-CM

## 2024-01-11 LAB — COMPREHENSIVE METABOLIC PANEL WITH GFR
ALT: 23 U/L (ref 0–44)
ALT: 23 U/L (ref 0–44)
AST: 27 U/L (ref 15–41)
AST: 27 U/L (ref 15–41)
Albumin: 2.7 g/dL — ABNORMAL LOW (ref 3.5–5.0)
Albumin: 2.5 g/dL — ABNORMAL LOW (ref 3.5–5.0)
Alkaline Phosphatase: 125 U/L (ref 38–126)
Alkaline Phosphatase: 119 U/L (ref 38–126)
Anion gap: 10 (ref 5–15)
Anion gap: 8 (ref 5–15)
BUN: 8 mg/dL (ref 6–20)
BUN: 8 mg/dL (ref 6–20)
CO2: 19 mmol/L — ABNORMAL LOW (ref 22–32)
CO2: 20 mmol/L — ABNORMAL LOW (ref 22–32)
Calcium: 8.7 mg/dL — ABNORMAL LOW (ref 8.9–10.3)
Calcium: 8.4 mg/dL — ABNORMAL LOW (ref 8.9–10.3)
Chloride: 105 mmol/L (ref 98–111)
Chloride: 106 mmol/L (ref 98–111)
Creatinine, Ser: 0.79 mg/dL (ref 0.44–1.00)
Creatinine, Ser: 0.86 mg/dL (ref 0.44–1.00)
GFR, Estimated: >60 mL/min (ref >60)
GFR, Estimated: >60 mL/min (ref >60)
Glucose, Bld: 93 mg/dL (ref 70–99)
Glucose, Bld: 94 mg/dL (ref 70–99)
Potassium: 4.3 mmol/L (ref 3.5–5.1)
Potassium: 4.4 mmol/L (ref 3.5–5.1)
Sodium: 134 mmol/L — ABNORMAL LOW (ref 135–145)
Sodium: 134 mmol/L — ABNORMAL LOW (ref 135–145)
Total Bilirubin: 0.6 mg/dL (ref 0.0–1.2)
Total Bilirubin: 0.6 mg/dL (ref 0.0–1.2)
Total Protein: 6.2 g/dL — ABNORMAL LOW (ref 6.5–8.1)
Total Protein: 5.8 g/dL — ABNORMAL LOW (ref 6.5–8.1)

## 2024-01-11 LAB — CBC
HCT: 35 % — ABNORMAL LOW (ref 36.0–46.0)
HCT: 38.3 % (ref 36.0–46.0)
HCT: 36.5 % (ref 36.0–46.0)
Hemoglobin: 11.9 g/dL — ABNORMAL LOW (ref 12.0–15.0)
Hemoglobin: 13.1 g/dL (ref 12.0–15.0)
Hemoglobin: 12.6 g/dL (ref 12.0–15.0)
MCH: 28.1 pg (ref 26.0–34.0)
MCH: 28.8 pg (ref 26.0–34.0)
MCH: 29.2 pg (ref 26.0–34.0)
MCHC: 34 g/dL (ref 30.0–36.0)
MCHC: 34.2 g/dL (ref 30.0–36.0)
MCHC: 34.5 g/dL (ref 30.0–36.0)
MCV: 82.7 fL (ref 80.0–100.0)
MCV: 84.2 fL (ref 80.0–100.0)
MCV: 84.7 fL (ref 80.0–100.0)
Platelets: 189 10*3/uL (ref 150–400)
Platelets: 179 10*3/uL (ref 150–400)
Platelets: 184 10*3/uL (ref 150–400)
RBC: 4.23 MIL/uL (ref 3.87–5.11)
RBC: 4.55 MIL/uL (ref 3.87–5.11)
RBC: 4.31 MIL/uL (ref 3.87–5.11)
RDW: 19.9 % — ABNORMAL HIGH (ref 11.5–15.5)
RDW: 19.9 % — ABNORMAL HIGH (ref 11.5–15.5)
RDW: 20 % — ABNORMAL HIGH (ref 11.5–15.5)
WBC: 6 10*3/uL (ref 4.0–10.5)
WBC: 5.8 10*3/uL (ref 4.0–10.5)
WBC: 7.4 10*3/uL (ref 4.0–10.5)
nRBC: 0 % (ref 0.0–0.2)
nRBC: 0 % (ref 0.0–0.2)
nRBC: 0 % (ref 0.0–0.2)

## 2024-01-11 LAB — PROTEIN / CREATININE RATIO, URINE
Creatinine, Urine: 135 mg/dL
Protein Creatinine Ratio: 0.33 mg/mg{creat} — ABNORMAL HIGH (ref 0.00–0.15)
Total Protein, Urine: 45 mg/dL

## 2024-01-11 LAB — TYPE AND SCREEN
ABO/RH(D): B POS
Antibody Screen: NEGATIVE

## 2024-01-11 LAB — GLUCOSE, CAPILLARY
Glucose-Capillary: 101 mg/dL — ABNORMAL HIGH (ref 70–99)
Glucose-Capillary: 114 mg/dL — ABNORMAL HIGH (ref 70–99)

## 2024-01-11 LAB — RPR: RPR Ser Ql: NONREACTIVE

## 2024-01-11 LAB — GROUP B STREP BY PCR: Group B strep by PCR: NEGATIVE

## 2024-01-11 MED ORDER — LABETALOL HCL 5 MG/ML IV SOLN: 80.0000 mg | INTRAVENOUS | Status: DC | PRN

## 2024-01-11 MED ORDER — OXYTOCIN-SODIUM CHLORIDE 30-0.9 UT/500ML-% IV SOLN
1.0000 m[IU]/min | INTRAVENOUS | Status: DC
  Administered 2024-01-11: 2 m[IU]/min via INTRAVENOUS
  Filled 2024-01-11: qty 500

## 2024-01-11 MED ORDER — OXYTOCIN-SODIUM CHLORIDE 30-0.9 UT/500ML-% IV SOLN: 2.5000 [IU]/h | INTRAVENOUS | Status: DC

## 2024-01-11 MED ORDER — FENTANYL-BUPIVACAINE-NACL 0.5-0.125-0.9 MG/250ML-% EP SOLN
12.0000 mL/h | EPIDURAL | Status: DC | PRN
  Administered 2024-01-11: 12 mL/h via EPIDURAL
  Filled 2024-01-11: qty 250

## 2024-01-11 MED ORDER — SODIUM CHLORIDE 0.9% FLUSH
3.0000 mL | Freq: Two times a day (BID) | INTRAVENOUS | Status: DC
  Administered 2024-01-12 (×2): 3 mL via INTRAVENOUS

## 2024-01-11 MED ORDER — FUROSEMIDE 20 MG PO TABS: 20.0000 mg | ORAL_TABLET | Freq: Every day | ORAL | Status: DC

## 2024-01-11 MED ORDER — TRANEXAMIC ACID-NACL 1000-0.7 MG/100ML-% IV SOLN
INTRAVENOUS | Status: AC
  Administered 2024-01-11: 1000 mg via INTRAVENOUS
  Filled 2024-01-11: qty 100

## 2024-01-11 MED ORDER — DIPHENHYDRAMINE HCL 50 MG/ML IJ SOLN: 12.5000 mg | INTRAMUSCULAR | Status: DC | PRN

## 2024-01-11 MED ORDER — SENNOSIDES-DOCUSATE SODIUM 8.6-50 MG PO TABS
2.0000 | ORAL_TABLET | ORAL | Status: DC
  Administered 2024-01-12 – 2024-01-13 (×2): 2 via ORAL
  Filled 2024-01-11 (×2): qty 2

## 2024-01-11 MED ORDER — SODIUM CHLORIDE 0.9 % IV SOLN: 250.0000 mL | INTRAVENOUS | Status: DC | PRN

## 2024-01-11 MED ORDER — POTASSIUM CHLORIDE CRYS ER 20 MEQ PO TBCR
20.0000 meq | EXTENDED_RELEASE_TABLET | Freq: Every day | ORAL | Status: DC
  Administered 2024-01-12 – 2024-01-13 (×2): 20 meq via ORAL
  Filled 2024-01-11 (×2): qty 1

## 2024-01-11 MED ORDER — IBUPROFEN 600 MG PO TABS
600.0000 mg | ORAL_TABLET | Freq: Four times a day (QID) | ORAL | Status: DC
  Administered 2024-01-11 – 2024-01-12 (×3): 600 mg via ORAL
  Filled 2024-01-11 (×6): qty 1

## 2024-01-11 MED ORDER — TERBUTALINE SULFATE 1 MG/ML IJ SOLN: 0.2500 mg | Freq: Once | INTRAMUSCULAR | Status: DC | PRN

## 2024-01-11 MED ORDER — MISOPROSTOL 25 MCG QUARTER TABLET
25.0000 ug | ORAL_TABLET | ORAL | Status: DC | PRN
Start: 2024-01-11 — End: 2024-01-11

## 2024-01-11 MED ORDER — MAGNESIUM SULFATE 40 GM/1000ML IV SOLN
2.0000 g/h | INTRAVENOUS | Status: DC
Start: 2024-01-11 — End: 2024-01-11
  Administered 2024-01-11: 2 g/h via INTRAVENOUS

## 2024-01-11 MED ORDER — LIDOCAINE HCL (PF) 1 % IJ SOLN
30.0000 mL | INTRAMUSCULAR | Status: DC | PRN
Start: 2024-01-11 — End: 2024-01-11

## 2024-01-11 MED ORDER — LACTATED RINGERS IV SOLN
INTRAVENOUS | Status: AC
Start: 2024-01-11 — End: 2024-01-12

## 2024-01-11 MED ORDER — COCONUT OIL OIL
1.0000 | TOPICAL_OIL | Status: DC | PRN
Start: 2024-01-11 — End: 2024-01-13

## 2024-01-11 MED ORDER — MISOPROSTOL 50MCG HALF TABLET
50.0000 ug | ORAL_TABLET | Freq: Once | ORAL | Status: AC
  Administered 2024-01-11: 50 ug via ORAL
  Filled 2024-01-11 (×2): qty 1

## 2024-01-11 MED ORDER — ACETAMINOPHEN 325 MG PO TABS: 650.0000 mg | ORAL_TABLET | ORAL | Status: DC | PRN

## 2024-01-11 MED ORDER — DIBUCAINE (PERIANAL) 1 % EX OINT: 1.0000 | TOPICAL_OINTMENT | CUTANEOUS | Status: DC | PRN

## 2024-01-11 MED ORDER — LABETALOL HCL 5 MG/ML IV SOLN
INTRAVENOUS | Status: AC
  Administered 2024-01-11: 20 mg via INTRAVENOUS
  Filled 2024-01-11: qty 4

## 2024-01-11 MED ORDER — ONDANSETRON HCL 4 MG/2ML IJ SOLN: 4.0000 mg | INTRAMUSCULAR | Status: DC | PRN

## 2024-01-11 MED ORDER — CEFAZOLIN SODIUM-DEXTROSE 2-4 GM/100ML-% IV SOLN
2.0000 g | Freq: Once | INTRAVENOUS | Status: AC
  Administered 2024-01-11: 2 g via INTRAVENOUS
  Filled 2024-01-11: qty 100

## 2024-01-11 MED ORDER — FENTANYL CITRATE (PF) 100 MCG/2ML IJ SOLN
50.0000 ug | INTRAMUSCULAR | Status: DC | PRN
  Administered 2024-01-11 (×3): 100 ug via INTRAVENOUS
  Filled 2024-01-11 (×3): qty 2

## 2024-01-11 MED ORDER — LACTATED RINGERS IV SOLN
INTRAVENOUS | Status: DC
  Administered 2024-01-11: 125 mL via INTRAVENOUS

## 2024-01-11 MED ORDER — EPHEDRINE 5 MG/ML INJ: 10.0000 mg | INTRAVENOUS | Status: DC | PRN

## 2024-01-11 MED ORDER — LACTATED RINGERS IV SOLN: 500.0000 mL | INTRAVENOUS | Status: DC | PRN

## 2024-01-11 MED ORDER — BENZOCAINE-MENTHOL 20-0.5 % EX AERO: 1.0000 | INHALATION_SPRAY | CUTANEOUS | Status: DC | PRN

## 2024-01-11 MED ORDER — ONDANSETRON HCL 4 MG/2ML IJ SOLN
4.0000 mg | Freq: Four times a day (QID) | INTRAMUSCULAR | Status: DC | PRN
Start: 2024-01-11 — End: 2024-01-11
  Administered 2024-01-11: 4 mg via INTRAVENOUS
  Filled 2024-01-11: qty 2

## 2024-01-11 MED ORDER — WITCH HAZEL-GLYCERIN EX PADS: 1.0000 | MEDICATED_PAD | CUTANEOUS | Status: DC | PRN

## 2024-01-11 MED ORDER — ONDANSETRON HCL 4 MG PO TABS: 4.0000 mg | ORAL_TABLET | ORAL | Status: DC | PRN

## 2024-01-11 MED ORDER — DIPHENHYDRAMINE HCL 25 MG PO CAPS: 25.0000 mg | ORAL_CAPSULE | Freq: Four times a day (QID) | ORAL | Status: DC | PRN

## 2024-01-11 MED ORDER — LACTATED RINGERS IV SOLN: 500.0000 mL | Freq: Once | INTRAVENOUS | Status: DC

## 2024-01-11 MED ORDER — MAGNESIUM SULFATE 40 GM/1000ML IV SOLN
2.0000 g/h | INTRAVENOUS | Status: AC
  Administered 2024-01-12: 2 g/h via INTRAVENOUS
  Filled 2024-01-11: qty 1000

## 2024-01-11 MED ORDER — OXYCODONE-ACETAMINOPHEN 5-325 MG PO TABS: 2.0000 | ORAL_TABLET | ORAL | Status: DC | PRN

## 2024-01-11 MED ORDER — HYDRALAZINE HCL 20 MG/ML IJ SOLN: 10.0000 mg | INTRAMUSCULAR | Status: DC | PRN

## 2024-01-11 MED ORDER — MAGNESIUM SULFATE BOLUS VIA INFUSION
4.0000 g | Freq: Once | INTRAVENOUS | Status: AC
  Filled 2024-01-11: qty 1000

## 2024-01-11 MED ORDER — PHENYLEPHRINE 80 MCG/ML (10ML) SYRINGE FOR IV PUSH (FOR BLOOD PRESSURE SUPPORT): 80.0000 ug | PREFILLED_SYRINGE | INTRAVENOUS | Status: DC | PRN

## 2024-01-11 MED ORDER — NIFEDIPINE ER OSMOTIC RELEASE 60 MG PO TB24
60.0000 mg | ORAL_TABLET | Freq: Two times a day (BID) | ORAL | Status: DC
  Administered 2024-01-11 (×2): 60 mg via ORAL
  Filled 2024-01-11: qty 1
  Filled 2024-01-11: qty 2

## 2024-01-11 MED ORDER — MISOPROSTOL 25 MCG QUARTER TABLET
25.0000 ug | ORAL_TABLET | Freq: Once | ORAL | Status: AC
  Administered 2024-01-11: 25 ug via VAGINAL
  Filled 2024-01-11 (×2): qty 1

## 2024-01-11 MED ORDER — PRENATAL MULTIVITAMIN CH
1.0000 | ORAL_TABLET | Freq: Every day | ORAL | Status: DC
  Administered 2024-01-12 – 2024-01-13 (×2): 1 via ORAL
  Filled 2024-01-11 (×2): qty 1

## 2024-01-11 MED ORDER — OXYTOCIN BOLUS FROM INFUSION
333.0000 mL | Freq: Once | INTRAVENOUS | Status: AC
Start: 2024-01-11 — End: 2024-01-11
  Administered 2024-01-11: 333 mL via INTRAVENOUS

## 2024-01-11 MED ORDER — OXYCODONE-ACETAMINOPHEN 5-325 MG PO TABS: 1.0000 | ORAL_TABLET | ORAL | Status: DC | PRN

## 2024-01-11 MED ORDER — MISOPROSTOL 200 MCG PO TABS: 1000.0000 ug | ORAL_TABLET | Freq: Once | ORAL | Status: AC

## 2024-01-11 MED ORDER — MAGNESIUM SULFATE 40 GM/1000ML IV SOLN
INTRAVENOUS | Status: AC
  Administered 2024-01-11: 4 g via INTRAVENOUS
  Filled 2024-01-11: qty 1000

## 2024-01-11 MED ORDER — ZOLPIDEM TARTRATE 5 MG PO TABS: 5.0000 mg | ORAL_TABLET | Freq: Every evening | ORAL | Status: DC | PRN

## 2024-01-11 MED ORDER — SODIUM CHLORIDE 0.9 % IV SOLN
12.5000 mg | Freq: Four times a day (QID) | INTRAVENOUS | Status: DC | PRN
  Administered 2024-01-11: 12.5 mg via INTRAVENOUS
  Filled 2024-01-11: qty 12.5

## 2024-01-11 MED ORDER — LIDOCAINE HCL (PF) 1 % IJ SOLN
INTRAMUSCULAR | Status: DC | PRN
Start: 2024-01-11 — End: 2024-01-11
  Administered 2024-01-11: 5 mL via EPIDURAL
  Administered 2024-01-11: 2 mL via EPIDURAL
  Administered 2024-01-11: 3 mL via EPIDURAL

## 2024-01-11 MED ORDER — LABETALOL HCL 5 MG/ML IV SOLN: 20.0000 mg | INTRAVENOUS | Status: DC | PRN

## 2024-01-11 MED ORDER — MISOPROSTOL 200 MCG PO TABS
ORAL_TABLET | ORAL | Status: AC
  Administered 2024-01-11: 1000 ug via RECTAL
  Filled 2024-01-11: qty 5

## 2024-01-11 MED ORDER — SODIUM CHLORIDE 0.9% FLUSH: 3.0000 mL | INTRAVENOUS | Status: DC | PRN

## 2024-01-11 MED ORDER — TETANUS-DIPHTH-ACELL PERTUSSIS 5-2.5-18.5 LF-MCG/0.5 IM SUSY: 0.5000 mL | PREFILLED_SYRINGE | Freq: Once | INTRAMUSCULAR | Status: DC

## 2024-01-11 MED ORDER — ACETAMINOPHEN 325 MG PO TABS
650.0000 mg | ORAL_TABLET | ORAL | Status: DC | PRN
Start: 2024-01-11 — End: 2024-01-11
  Administered 2024-01-11: 650 mg via ORAL
  Filled 2024-01-11: qty 2

## 2024-01-11 MED ORDER — SOD CITRATE-CITRIC ACID 500-334 MG/5ML PO SOLN: 30.0000 mL | ORAL | Status: DC | PRN

## 2024-01-11 MED ORDER — LABETALOL HCL 5 MG/ML IV SOLN
40.0000 mg | INTRAVENOUS | Status: DC | PRN
  Administered 2024-01-11: 40 mg via INTRAVENOUS
  Filled 2024-01-11: qty 8

## 2024-01-11 MED ORDER — SIMETHICONE 80 MG PO CHEW: 80.0000 mg | CHEWABLE_TABLET | ORAL | Status: DC | PRN

## 2024-01-11 MED ORDER — MEASLES, MUMPS & RUBELLA VAC IJ SOLR: 0.5000 mL | Freq: Once | INTRAMUSCULAR | Status: DC

## 2024-01-11 MED ORDER — TRANEXAMIC ACID-NACL 1000-0.7 MG/100ML-% IV SOLN: 1000.0000 mg | INTRAVENOUS | Status: AC

## 2024-01-11 NOTE — Progress Notes (Signed)
 Labor Progress Note Tammy Mcgrath is a 43 y.o. [id number] at [redacted]w[redacted]d presented for IOL for cHTN on Procardia .  S: Resting comfortably at bedside, feeling some cramping.   O:  BP (!) 154/93   Pulse 77   Temp 98.8 F (37.1 C) (Oral)   Resp 14   Ht 5\' 9"  (1.753 m)   Wt 89.8 kg   LMP 05/09/2023   BMI 29.24 kg/m  EFM: 135/mod/+a/-d  CVE: Dilation: 3.5 Effacement (%): 60, 70 Station: Ballotable Presentation: Vertex Exam by:: Dr. Hubert Madden   A&P: 43 y.o. [id number] [redacted]w[redacted]d here for IOL due to cHTN.  #Labor: Progressing well. Now 3.5cm, fetal head still ballotable, so will start Pitocin  2x2.  #Pain: Per pt request #FWB: Cat I #GBS PCR negative  #cHTN: BP have been moderate range  PEC labs wnl, PCR pending  #A1GDM: CBGs within range  baby is AGA   #MDD: early PPD screen   [name], MD 1:12 PM

## 2024-01-11 NOTE — Progress Notes (Addendum)
 Labor progress note:  At bedside to re-evaluate patient. Much more uncomfortable w contractions, having to breathe through them. Family members providing support at bedside. Cat I tracing, contracting regularly. On 6 of Pitocin . Cervix 5/80/-3, some bloody show. Pt very uncomfortable w cervical check, offered AROM, but would defer for now. Requesting epidural.   SIPE: PCR elevated, remainder of labs wnl  monitoring BP closely  A1GDM: Cont monitoring CBGs  MDD: Early PPD screen  [name], MD OB Fellow, Faculty Practice [facility], Center for [facility]

## 2024-01-11 NOTE — Discharge Summary (Signed)
 Postpartum Discharge Summary  Date of Service updated 01/13/2024     Patient Name: Tammy Mcgrath DOB: May 24, 1981 MRN: [id number]  Date of admission: 01/11/2024 Delivery date:01/11/2024 Delivering provider: [name] Date of discharge: 01/13/2024  Admitting diagnosis: Encounter for induction of labor [Z34.90] Intrauterine pregnancy: [redacted]w[redacted]d     Secondary diagnosis:  Principal Problem:   Encounter for induction of labor  Additional problems: non    Discharge diagnosis: Term Pregnancy Delivered, CHTN with superimposed preeclampsia, and GDM A1                                              Post partum procedures:no Augmentation: AROM and Pitocin  Complications: Suspected Placental Abruption  Hospital course: Induction of Labor With Vaginal Delivery   43 y.o. yo [id number] at [redacted]w[redacted]d was admitted to the hospital 01/11/2024 for induction of labor.  Indication for induction: A1 DM and SIPE w/ SF.  Patient had an labor course complicated by suspected placental abruption at delivery.  Also noted to have uterine atony requiring Jada placement w/o PPH.  Membrane Rupture Time/Date: 3:57 PM,01/11/2024  Delivery Method:Vaginal, Spontaneous Operative Delivery:N/A Episiotomy: None Lacerations:  1st degree;Perineal Details of delivery can be found in separate delivery note.  Patient had a postpartum course complicated by PPH . Patient is discharged home 01/13/2024   Newborn Data: Birth date:01/11/2024 Birth time:4:47 PM Gender:Female Living status:Living Apgars:7 ,8  Weight:3260 g  Magnesium  Sulfate received: Yes: Seizure prophylaxis BMZ received: No Rhophylac:N/A MMR:N/A T-DaP:Given prenatallyordered Flu: No RSV Vaccine received: No Transfusion:No  Immunizations received: Immunization History  Administered Date(s) Administered   Tdap 06/22/2021    Physical exam  Vitals:   01/12/24 1946 01/12/24 2303 01/13/24 0459 01/13/24 0917  BP: 125/81 131/84 129/75 128/77  Pulse: 92 98 83  82  Resp: 18 17 18 16   Temp: 98 F (36.7 C) 98.8 F (37.1 C) 98.8 F (37.1 C) 98.7 F (37.1 C)  TempSrc: Oral Oral Oral Oral  SpO2:  100% 100% 100%  Weight:      Height:       General: alert, cooperative, and no distress Lochia: appropriate Uterine Fundus: firm Incision: N/A DVT Evaluation: No evidence of DVT seen on physical exam. Labs: Lab Results  Component Value Date   WBC 8.1 01/12/2024   HGB 10.1 (L) 01/12/2024   HCT 28.9 (L) 01/12/2024   MCV 83.3 01/12/2024   PLT 154 01/12/2024      Latest Ref Rng & Units 01/12/2024    5:20 AM  CMP  Glucose 70 - 99 mg/dL 366   BUN 6 - 20 mg/dL 8   Creatinine 4.40 - 3.47 mg/dL 4.25   Sodium 956 - 387 mmol/L 135   Potassium 3.5 - 5.1 mmol/L 3.6   Chloride 98 - 111 mmol/L 104   CO2 22 - 32 mmol/L 21   Calcium 8.9 - 10.3 mg/dL 7.6   Total Protein 6.5 - 8.1 g/dL 5.0   Total Bilirubin 0.0 - 1.2 mg/dL 0.3   Alkaline Phos 38 - 126 U/L 90   AST 15 - 41 U/L 32   ALT 0 - 44 U/L 24    Edinburgh Score:    07/28/2021   11:36 AM  Edinburgh Postnatal Depression Scale Screening Tool  I have been able to laugh and see the funny side of things. 0  I have looked forward with  enjoyment to things. 0  I have blamed myself unnecessarily when things went wrong. 1  I have been anxious or worried for no good reason. 1  I have felt scared or panicky for no good reason. 1  Things have been getting on top of me. 0  I have been so unhappy that I have had difficulty sleeping. 1  I have felt sad or miserable. 1  I have been so unhappy that I have been crying. 0  The thought of harming myself has occurred to me. 0  Edinburgh Postnatal Depression Scale Total 5   No data recorded  After visit meds:  Allergies as of 01/13/2024   No Known Allergies      Medication List     STOP taking these medications    Accu-Chek Guide w/Device Kit   Accu-Chek Softclix Lancets lancets   aspirin  EC 81 MG tablet   Dexcom G7 Sensor Misc   glucose blood  test strip   PRENATAL VITAMIN PO       TAKE these medications    furosemide  20 MG tablet Commonly known as: LASIX  Take 1 tablet (20 mg total) by mouth daily.   ibuprofen  600 MG tablet Commonly known as: ADVIL  Take 1 tablet (600 mg total) by mouth every 6 (six) hours.   NIFEdipine  60 MG 24 hr tablet Commonly known as: ADALAT  CC Take 1 tablet (60 mg total) by mouth daily. What changed: when to take this         Discharge home in stable condition Infant Feeding: Breast Infant Disposition:home with mother Discharge instruction: per After Visit Summary and Postpartum booklet. Activity: Advance as tolerated. Pelvic rest for 6 weeks.  Diet: carb modified diet Future Appointments: Future Appointments  Date Time Provider Department Center  01/16/2024 10:15 AM Malka Sea, DO [facility] None  01/23/2024 10:55 AM Alto Atta, Jodelle Mungo, MD [facility] None  01/30/2024 10:35 AM Alto Atta, Jodelle Mungo, MD [facility] None  02/29/2024  9:20 AM Emmette Harms, Kardie, DO [facility] H&V  04/28/2024  3:00 PM Zarwolo, Gloria, FNP RPC-[facility] RPC   Follow up Visit:  Follow-up Information     [facility] [place] Follow up in 1 month(s).   Specialty: Obstetrics and Gynecology Contact information: [address] [place]  [address] [phone]               Message to Hpt 5/30  Please schedule this patient for a In person postpartum visit in 4 weeks with the following provider: Any provider. Additional Postpartum F/U:2 hour GTT and BP check 1 week  High risk pregnancy complicated by: GDM and HTN Delivery mode:  Vaginal, Spontaneous Anticipated Birth Control:  BTL, will discuss LBTL at f/u   01/13/2024 Onnie Bilis, MD

## 2024-01-11 NOTE — Anesthesia Procedure Notes (Signed)
 Epidural Patient location during procedure: OB Start time: 01/11/2024 2:35 PM End time: 01/11/2024 2:42 PM  Staffing Anesthesiologist: Peggy Bowens, MD Performed: anesthesiologist   Preanesthetic Checklist Completed: patient identified, IV checked, site marked, risks and benefits discussed, surgical consent, monitors and equipment checked, pre-op evaluation and timeout performed  Epidural Patient position: sitting Prep: DuraPrep and site prepped and draped Patient monitoring: continuous pulse ox and blood pressure Approach: midline Location: L4-L5 Injection technique: LOR air  Needle:  Needle type: Tuohy  Needle gauge: 17 G Needle length: 9 cm and 9 Needle insertion depth: 6 cm Catheter type: closed end flexible Catheter size: 19 Gauge Catheter at skin depth: 11 cm Test dose: negative  Assessment Events: blood not aspirated, no cerebrospinal fluid, injection not painful, no injection resistance, no paresthesia and negative IV test

## 2024-01-11 NOTE — H&P (Signed)
 Tammy Mcgrath is a 43 y.o. female [phone] with IUP at [redacted]w[redacted]d presenting for IOL for CHTN and A1DM. [facility] at [facility]  Prenatal History/Complications:  SVD X2 at term, 4 early SABs CHTN on procardia    EFW 61%  Past Medical History: Past Medical History:  Diagnosis Date   Atopic dermatitis 04/11/2022   Chronic hypertension    Chronic migraine without aura, with intractable migraine, so stated, with status migrainosus 03/26/2022   Depression    not a formal dx, "is sad, not suicidal"   Eczema    Fibroid    Gestational diabetes    Headache    Maternal iron  deficiency anemia affecting pregnancy in second trimester, antepartum 11/02/2023   Migraines    Otitis externa of both ears 12/07/2022   Sickle cell trait (HCC)    UTI (urinary tract infection)     Past Surgical History: Past Surgical History:  Procedure Laterality Date   BREAST SURGERY     reduction   DILATION AND CURETTAGE OF UTERUS      Obstetrical History: OB History     Gravida  7   Para  2   Term  2   Preterm      AB  4   Living  2      SAB  4   IAB      Ectopic      Multiple  0   Live Births  2           Social History: Social History   Socioeconomic History   Marital status: Single    Spouse name: Not on file   Number of children: Not on file   Years of education: Not on file   Highest education level: Not on file  Occupational History   Not on file  Tobacco Use   Smoking status: Never   Smokeless tobacco: Never  Vaping Use   Vaping status: Never Used  Substance and Sexual Activity   Alcohol use: Not Currently   Drug use: Not Currently    Types: Marijuana    Comment: last was ~3 months ago   Sexual activity: Not Currently  Other Topics Concern   Not on file  Social History Narrative   Not on file   Social Drivers of Health   Financial Resource Strain: Not on file  Food Insecurity: No Food Insecurity (01/11/2024)   Hunger Vital Sign    Worried About Running  Out of Food in the Last Year: Never true    Ran Out of Food in the Last Year: Never true  Transportation Needs: No Transportation Needs (01/11/2024)   PRAPARE - Administrator, Civil Service (Medical): No    Lack of Transportation (Non-Medical): No  Physical Activity: Not on file  Stress: Not on file  Social Connections: Not on file    Family History: Family History  Problem Relation Age of Onset   Other Mother        believes she has problems, but doens't go to dr or talk about it   Hypertension Father    Stroke Father    Diabetes Father     Allergies: No Known Allergies  Medications Prior to Admission  Medication Sig Dispense Refill Last Dose/Taking   NIFEdipine  (PROCARDIA  XL/NIFEDICAL XL) 60 MG 24 hr tablet Take 1 tablet (60 mg total) by mouth 2 (two) times daily. 90 tablet 0 01/10/2024   Prenatal Vit-Fe Fumarate-FA (PRENATAL VITAMIN PO) Take by mouth.  01/10/2024   Accu-Chek Softclix Lancets lancets Use as instructed 100 each 12    aspirin  EC 81 MG tablet Take 2 tablets (162 mg total) by mouth at bedtime. Start taking when you are [redacted] weeks pregnant for rest of pregnancy for prevention of preeclampsia 300 tablet 2 Unknown   Blood Glucose Monitoring Suppl (ACCU-CHEK GUIDE) w/Device KIT 1 Device by Does not apply route 4 (four) times daily. 1 kit 0    Continuous Glucose Sensor (DEXCOM G7 SENSOR) MISC Place one sensor on every 10 days 3 each 3    glucose blood test strip Use as instructed 100 each 12         Review of Systems   Constitutional: Negative for fever and chills Eyes: Negative for visual disturbances Respiratory: Negative for shortness of breath, dyspnea Cardiovascular: Negative for chest pain or palpitations  Gastrointestinal: Negative for abdominal pain, vomiting, diarrhea and constipation.   Genitourinary: Negative for dysuria and urgency Musculoskeletal: Negative for back pain, joint pain, myalgias  Neurological: Negative for dizziness and  headaches      Blood pressure (!) 157/94, pulse 84, height 5\' 9"  (1.753 m), weight 89.8 kg, last menstrual period 05/09/2023. General appearance: alert, cooperative, and no distress Lungs: normal respiratory effort Heart: regular rate and rhythm Abdomen: soft, non-tender; bowel sounds normal Extremities: Homans sign is negative, no sign of DVT DTR's 2+ Presentation: cephalic Fetal monitoring  Baseline: 140 bpm, Variability: Good {> 6 bpm), Accelerations: Reactive, and Decelerations: Absent Uterine activity  rare  Dilation: 2 Effacement (%): 50 Station: -3 Exam by:: Elton Ham RN   Prenatal labs: ABO, Rh: --/--/B POS (05/30 0431) Antibody: NEG (05/30 0431) Rubella: immune RPR: Non Reactive (11/20 1540)  HBsAg: Negative (11/20 1540)  HIV: Non Reactive (11/20 1540)  GBS:     NURSING  PROVIDER  Office Location [place] Dating by U/S at 7 wks  [facility] Model Traditional Anatomy U/S Normal  Initiated care at  [facility]  English              LAB RESULTS   Support Person Zavere(FOB)  Genetics NIPS: LR F AFP:     Carrier Screen Horizon: SMA carrier, Sickle cell trait  Rhogam  B/Positive/-- (11/20 1540) A1C/GTT Early: A1C 5.7>early 2 hr GTT Third trimester: ABNORMAL  Flu Vaccine Declines     TDaP Vaccine   Blood Type B/Positive/-- (11/20 1540)  Covid Vaccine  Antibody Negative (11/20 1540)  RSV Vaccine  Rubella 5.05 (11/20 1540)  Feeding Plan Both RPR Non Reactive (11/20 1540)  Contraception BTL  HBsAg Negative (11/20 1540)  Circumcision N/A HIV Non Reactive (11/20 1540)  Pediatrician  Kids zone  HCVAb Non Reactive (11/20 1540)  Prenatal Classes     BTL Consent  Pap Diagnosis  Date Value Ref Range Status  07/04/2023   Final   - Negative for Intraepithelial Lesions or Malignancy (NILM)  07/04/2023 - Benign reactive/reparative changes  Final    BTL Pre-payment  GC/CT Initial:  neg x 2 36wks:  -/-  VBAC Consent  GBS  PCR Pending       DME Rx [ ]  BP  cuff [ ]  Weight Scale Waterbirth  [ ]  Class [ ]  Consent [ ]  CNM visit  PHQ9 & GAD7 [  ] new OB [  ] 28 weeks  [  ] 36 weeks Induction  [ ]  Orders Entered [ ] Foley Y/N  Prenatal Transfer Tool  Maternal Diabetes: Yes:  Diabetes Type:  Diet controlled Genetic Screening: Normal Maternal Ultrasounds/Referrals: Normal Fetal Ultrasounds or other Referrals:  Referred to Materal Fetal Medicine  Maternal Substance Abuse:  No Significant Maternal Medications:  None Significant Maternal Lab Results: Other:  GBS culture still pending, will collect PCR   Results for orders placed or performed during the hospital encounter of 01/11/24 (from the past 24 hours)  CBC   Collection Time: 01/11/24  4:30 AM  Result Value Ref Range   WBC 6.0 4.0 - 10.5 K/uL   RBC 4.23 3.87 - 5.11 MIL/uL   Hemoglobin 11.9 (L) 12.0 - 15.0 g/dL   HCT 16.1 (L) 09.6 - 04.5 %   MCV 82.7 80.0 - 100.0 fL   MCH 28.1 26.0 - 34.0 pg   MCHC 34.0 30.0 - 36.0 g/dL   RDW 40.9 (H) 81.1 - 91.4 %   Platelets 189 150 - 400 K/uL   nRBC 0.0 0.0 - 0.2 %  Type and screen   Collection Time: 01/11/24  4:31 AM  Result Value Ref Range   ABO/RH(D) B POS    Antibody Screen NEG    Sample Expiration      01/14/2024,2359 Performed at [facility], [address], [place], [place] [address]   Glucose, capillary   Collection Time: 01/11/24  5:23 AM  Result Value Ref Range   Glucose-Capillary 101 (H) 70 - 99 mg/dL   Comment 1 Notify RN    Comment 2 Document in Chart     Assessment: Tammy Mcgrath is a 43 y.o. [id number] with an IUP at [redacted]w[redacted]d presenting for IOL for GBS u.  Plan: #Labor: Dual Cytotec >plan AROM #Pain:  Per request #FWB Cat 1 #ID: GBS: pcr collected  #MOF:  both #MOC: BTL (private insurance)    Majel Scott 01/11/2024, 6:53 AM

## 2024-01-11 NOTE — Anesthesia Preprocedure Evaluation (Signed)
 Anesthesia Evaluation  Patient identified by MRN, date of birth, ID band Patient awake    Reviewed: Allergy & Precautions, Patient's Chart, lab work & pertinent test results  Airway Mallampati: II  TM Distance: >3 FB     Dental   Pulmonary neg pulmonary ROS   Pulmonary exam normal        Cardiovascular hypertension (Pre-E), Normal cardiovascular exam     Neuro/Psych  Headaches    GI/Hepatic negative GI ROS, Neg liver ROS,,,  Endo/Other  diabetes, Type 2    Renal/GU negative Renal ROS     Musculoskeletal   Abdominal   Peds  Hematology  (+) Blood dyscrasia, Sickle cell trait and anemia   Anesthesia Other Findings   Reproductive/Obstetrics (+) Pregnancy                             Anesthesia Physical Anesthesia Plan  ASA: 3  Anesthesia Plan: Epidural   Post-op Pain Management:    Induction:   PONV Risk Score and Plan: 2 and Treatment may vary due to age or medical condition  Airway Management Planned: Natural Airway  Additional Equipment:   Intra-op Plan:   Post-operative Plan:   Informed Consent: I have reviewed the patients History and Physical, chart, labs and discussed the procedure including the risks, benefits and alternatives for the proposed anesthesia with the patient or authorized representative who has indicated his/her understanding and acceptance.       Plan Discussed with:   Anesthesia Plan Comments:        Anesthesia Quick Evaluation

## 2024-01-11 NOTE — Progress Notes (Signed)
 Labor progress note:  Pt having early decels, cat I tracing. Pt now comfortable w epidural. Cervix rechecked, bloody show noted - now 8.5/90/-1, offered AROM and pt consented verbally. AROM performed w moderate amount of clear/bloody fluid. Continue Pitocin . Anticipate SVD soon.  Superimposed pre-eclampsia w SF: Elevated BP x 2, will start Mag at this time, Labetalol  protocol started.   [name], MD OB Fellow, Faculty Practice [facility], Center for [facility]

## 2024-01-12 ENCOUNTER — Encounter (HOSPITAL_COMMUNITY)
Admission: RE | Disposition: A | Discharge status: Home / Self Care | Payer: Self-pay | Source: Home / Self Care | Attending: Obstetrics and Gynecology

## 2024-01-12 LAB — CBC
HCT: 28.9 % — ABNORMAL LOW (ref 36.0–46.0)
Hemoglobin: 10.1 g/dL — ABNORMAL LOW (ref 12.0–15.0)
MCH: 29.1 pg (ref 26.0–34.0)
MCHC: 34.9 g/dL (ref 30.0–36.0)
MCV: 83.3 fL (ref 80.0–100.0)
Platelets: 154 10*3/uL (ref 150–400)
RBC: 3.47 MIL/uL — ABNORMAL LOW (ref 3.87–5.11)
RDW: 20 % — ABNORMAL HIGH (ref 11.5–15.5)
WBC: 8.1 10*3/uL (ref 4.0–10.5)
nRBC: 0 % (ref 0.0–0.2)

## 2024-01-12 LAB — COMPREHENSIVE METABOLIC PANEL WITH GFR
ALT: 24 U/L (ref 0–44)
AST: 32 U/L (ref 15–41)
Albumin: 2.1 g/dL — ABNORMAL LOW (ref 3.5–5.0)
Alkaline Phosphatase: 90 U/L (ref 38–126)
Anion gap: 10 (ref 5–15)
BUN: 8 mg/dL (ref 6–20)
CO2: 21 mmol/L — ABNORMAL LOW (ref 22–32)
Calcium: 7.6 mg/dL — ABNORMAL LOW (ref 8.9–10.3)
Chloride: 104 mmol/L (ref 98–111)
Creatinine, Ser: 0.89 mg/dL (ref 0.44–1.00)
GFR, Estimated: >60 mL/min (ref >60)
Glucose, Bld: 153 mg/dL — ABNORMAL HIGH (ref 70–99)
Potassium: 3.6 mmol/L (ref 3.5–5.1)
Sodium: 135 mmol/L (ref 135–145)
Total Bilirubin: 0.3 mg/dL (ref 0.0–1.2)
Total Protein: 5 g/dL — ABNORMAL LOW (ref 6.5–8.1)

## 2024-01-12 LAB — GLUCOSE, CAPILLARY
Glucose-Capillary: 167 mg/dL — ABNORMAL HIGH (ref 70–99)
Glucose-Capillary: 109 mg/dL — ABNORMAL HIGH (ref 70–99)

## 2024-01-12 SURGERY — LIGATION, FALLOPIAN TUBE, POSTPARTUM
Anesthesia: Epidural | Laterality: Bilateral

## 2024-01-12 MED ORDER — NIFEDIPINE ER OSMOTIC RELEASE 60 MG PO TB24
60.0000 mg | ORAL_TABLET | Freq: Every day | ORAL | Status: DC
  Administered 2024-01-12: 60 mg via ORAL
  Filled 2024-01-12: qty 1

## 2024-01-12 MED ORDER — FUROSEMIDE 20 MG PO TABS
20.0000 mg | ORAL_TABLET | Freq: Every day | ORAL | Status: DC
  Administered 2024-01-12 – 2024-01-13 (×2): 20 mg via ORAL
  Filled 2024-01-12 (×2): qty 1

## 2024-01-12 NOTE — Lactation Note (Signed)
 This note was copied from a baby's chart. Lactation Consultation Note  Patient Name: Tammy Mcgrath [id number] Date: 01/12/2024 Age:41 hours  LC attempted to see MOB and infant but family was asleep at this time. LC services will follow up with family in morning.   Maternal Data    Feeding Nipple Type: Slow - flow  LATCH Score                    Lactation Tools Discussed/Used    Interventions    Discharge    Consult Status      Tammy Mcgrath 01/12/2024, 2:14 AM

## 2024-01-12 NOTE — Anesthesia Postprocedure Evaluation (Signed)
 Anesthesia Post Note  Patient: Tammy Mcgrath  Procedure(s) Performed: AN AD HOC LABOR EPIDURAL     Patient location during evaluation: Mother Baby Anesthesia Type: Epidural Level of consciousness: awake and alert Pain management: pain level controlled Vital Signs Assessment: post-procedure vital signs reviewed and stable Respiratory status: spontaneous breathing, nonlabored ventilation and respiratory function stable Cardiovascular status: stable Postop Assessment: no headache, no backache and epidural receding Anesthetic complications: no   No notable events documented.  Last Vitals:  Vitals:   01/12/24 0500 01/12/24 0530  BP:  119/75  Pulse:  87  Resp: 16   Temp:  36.6 C  SpO2:      Last Pain:  Vitals:   01/12/24 0530  TempSrc: Oral  PainSc:    Pain Goal:                   Charo Philipp

## 2024-01-12 NOTE — Plan of Care (Signed)
  Problem: Education: Goal: Knowledge of General Education information will improve Description: Including pain rating scale, medication(s)/side effects and non-pharmacologic comfort measures Outcome: Progressing   Problem: Health Behavior/Discharge Planning: Goal: Ability to manage health-related needs will improve Outcome: Progressing   Problem: Clinical Measurements: Goal: Ability to maintain clinical measurements within normal limits will improve Outcome: Progressing Goal: Will remain free from infection Outcome: Progressing Goal: Diagnostic test results will improve Outcome: Progressing Goal: Respiratory complications will improve Outcome: Progressing Goal: Cardiovascular complication will be avoided Outcome: Progressing   Problem: Activity: Goal: Risk for activity intolerance will decrease Outcome: Progressing   Problem: Nutrition: Goal: Adequate nutrition will be maintained Outcome: Progressing   Problem: Coping: Goal: Level of anxiety will decrease Outcome: Progressing   Problem: Elimination: Goal: Will not experience complications related to bowel motility Outcome: Progressing Goal: Will not experience complications related to urinary retention Outcome: Progressing   Problem: Pain Managment: Goal: General experience of comfort will improve and/or be controlled Outcome: Progressing   Problem: Safety: Goal: Ability to remain free from injury will improve Outcome: Progressing   Problem: Skin Integrity: Goal: Risk for impaired skin integrity will decrease Outcome: Progressing   Problem: Education: Goal: Knowledge of disease or condition will improve Outcome: Progressing Goal: Knowledge of the prescribed therapeutic regimen will improve Outcome: Progressing   Problem: Fluid Volume: Goal: Peripheral tissue perfusion will improve Outcome: Progressing   Problem: Clinical Measurements: Goal: Complications related to disease process, condition or treatment  will be avoided or minimized Outcome: Progressing   Problem: Education: Goal: Ability to describe self-care measures that may prevent or decrease complications (Diabetes Survival Skills Education) will improve Outcome: Progressing Goal: Individualized Educational Video(s) Outcome: Progressing   Problem: Coping: Goal: Ability to adjust to condition or change in health will improve Outcome: Progressing   Problem: Fluid Volume: Goal: Ability to maintain a balanced intake and output will improve Outcome: Progressing   Problem: Health Behavior/Discharge Planning: Goal: Ability to identify and utilize available resources and services will improve Outcome: Progressing Goal: Ability to manage health-related needs will improve Outcome: Progressing   Problem: Metabolic: Goal: Ability to maintain appropriate glucose levels will improve Outcome: Progressing   Problem: Nutritional: Goal: Maintenance of adequate nutrition will improve Outcome: Progressing Goal: Progress toward achieving an optimal weight will improve Outcome: Progressing   Problem: Skin Integrity: Goal: Risk for impaired skin integrity will decrease Outcome: Progressing   Problem: Tissue Perfusion: Goal: Adequacy of tissue perfusion will improve Outcome: Progressing   Problem: Education: Goal: Knowledge of condition will improve Outcome: Progressing Goal: Individualized Educational Video(s) Outcome: Progressing Goal: Individualized Newborn Educational Video(s) Outcome: Progressing   Problem: Activity: Goal: Will verbalize the importance of balancing activity with adequate rest periods Outcome: Progressing Goal: Ability to tolerate increased activity will improve Outcome: Progressing   Problem: Coping: Goal: Ability to identify and utilize available resources and services will improve Outcome: Progressing

## 2024-01-12 NOTE — Progress Notes (Signed)
 Post Partum Day 1 Subjective: no complaints, up ad lib, voiding, and tolerating PO Plans to discuss interval LBTL at [facility] visit Objective: Blood pressure 119/75, pulse 87, temperature 97.9 F (36.6 C), temperature source Oral, resp. rate 17, height 5\' 9"  (1.753 m), weight 89.8 kg, last menstrual period 05/09/2023, SpO2 100%, unknown if currently breastfeeding.  Physical Exam:  General: alert, cooperative, and no distress Lochia: appropriate Uterine Fundus: firm Incision:  DVT Evaluation: No evidence of DVT seen on physical exam.  Recent Labs    01/11/24 1637 01/12/24 0520  HGB 12.6 10.1*  HCT 36.5 28.9*    Assessment/Plan: 24 hr magnesium  to complete today. Procardia  and lasix  ordered   LOS: 1 day   Onnie Bilis, MD 01/12/2024, 10:10 AM

## 2024-01-12 NOTE — Lactation Note (Signed)
 This note was copied from a baby's chart. Lactation Consultation Note  Patient Name: Tammy Mcgrath [id number] Date: 01/12/2024 Age:43 years Reason for consult: Initial assessment;Exclusive pumping and bottle feeding;Early term 37-38.6wks;Maternal endocrine disorder, see MOB MR: hx anemia, CHTN and GDM.  CO: Per MOB, she  wants to pump only and formula feed infant, Per FOB infant is sleepy and consumes only  smalls amounts of formula, can go 4 hours or longer without eating.   LC discussed infant small tummy size in which she requires frequent feedings, LC discussed to feed infant every 3 hours and do stimulation techniques to keep her awake, undress her only during the feeding, talking, burping and gently stroking her. LC assisted with the bottle feeding at this time, MOB originally used yellow slow flow nipple, but LC observe infant facial features of grimacing, tongue thrusting and formula sipping out the sides of her mouth. LC switched nipple to [facility] nipple, infant feed well was pace feed and burped, infant had  no spillage out the sides of her mouth  and consumed 32 mls . LC set MOB up with 18 mm breast flange and MOB pumped. MOB knows that the [facility] nipple is safe to use for 24 hours.   MOB understands that her EBM is safe for 4 hours whereas formula once open must be used within 1 hour.  Current feeding plan: 1- MOB will feed infant every 3 hours, will offer any EBM first and then formula. Day 2 will offer 15-30 mls or more pace feed if infant wants it. 2- MOB will continue to use the DEBP every 3 hours for 15 minutes on initial setting. 3- MOB knows to call [facility] services if she has any questions or concerns regarding breastfeeding.  Maternal Data Has patient been taught Hand Expression?: Yes Does the patient have breastfeeding experience prior to this delivery?: Yes How long did the patient breastfeed?: Per MOB, did not BF 1st child, tried few days with 2nd, child, she  pumped for 2 months..  Feeding Mother's Current Feeding Choice: Breast Milk and Formula  LATCH Score                    Lactation Tools Discussed/Used Tools: Pump;Flanges Flange Size: 18 Breast pump type: Double-Electric Breast Pump Pump Education: Setup, frequency, and cleaning;Milk Storage Reason for Pumping: MOB's feeding choice. Pumping frequency: MOB will continue to pump every 3 hours for 15 minutes.  Interventions Interventions: Breast feeding basics reviewed;Skin to skin;Hand pump;DEBP;Education;Pace feeding;CDC Guidelines for Breast Pump Cleaning;CDC milk storage guidelines;LC Services brochure;Guidelines for Milk Supply and Pumping Schedule Handout  Discharge Pump: DEBP  Consult Status      [name] 01/12/2024, 5:08 PM

## 2024-01-13 LAB — GLUCOSE, CAPILLARY: Glucose-Capillary: 106 mg/dL — ABNORMAL HIGH (ref 70–99)

## 2024-01-13 LAB — CULTURE, BETA STREP (GROUP B ONLY): Strep Gp B Culture: NEGATIVE

## 2024-01-13 MED ORDER — NIFEDIPINE ER 60 MG PO TB24
60.0000 mg | ORAL_TABLET | Freq: Every day | ORAL | 1 refills | Status: AC
Start: 2024-01-13 — End: ?

## 2024-01-13 MED ORDER — IBUPROFEN 600 MG PO TABS: 600.0000 mg | ORAL_TABLET | Freq: Four times a day (QID) | ORAL | 0 refills | Status: DC

## 2024-01-13 MED ORDER — FUROSEMIDE 20 MG PO TABS: 20.0000 mg | ORAL_TABLET | Freq: Every day | ORAL | 0 refills | Status: DC

## 2024-01-13 NOTE — Plan of Care (Signed)
  Problem: Education: Goal: Knowledge of General Education information will improve Description: Including pain rating scale, medication(s)/side effects and non-pharmacologic comfort measures Outcome: Completed/Met   Problem: Health Behavior/Discharge Planning: Goal: Ability to manage health-related needs will improve Outcome: Completed/Met   Problem: Clinical Measurements: Goal: Ability to maintain clinical measurements within normal limits will improve Outcome: Completed/Met Goal: Will remain free from infection Outcome: Completed/Met Goal: Diagnostic test results will improve Outcome: Completed/Met Goal: Respiratory complications will improve Outcome: Completed/Met Goal: Cardiovascular complication will be avoided Outcome: Completed/Met   Problem: Activity: Goal: Risk for activity intolerance will decrease Outcome: Completed/Met   Problem: Nutrition: Goal: Adequate nutrition will be maintained Outcome: Completed/Met   Problem: Coping: Goal: Level of anxiety will decrease Outcome: Completed/Met   Problem: Elimination: Goal: Will not experience complications related to bowel motility Outcome: Completed/Met Goal: Will not experience complications related to urinary retention Outcome: Completed/Met   Problem: Pain Managment: Goal: General experience of comfort will improve and/or be controlled Outcome: Completed/Met   Problem: Safety: Goal: Ability to remain free from injury will improve Outcome: Completed/Met   Problem: Skin Integrity: Goal: Risk for impaired skin integrity will decrease Outcome: Completed/Met   Problem: Education: Goal: Knowledge of disease or condition will improve Outcome: Completed/Met Goal: Knowledge of the prescribed therapeutic regimen will improve Outcome: Completed/Met   Problem: Fluid Volume: Goal: Peripheral tissue perfusion will improve Outcome: Completed/Met   Problem: Clinical Measurements: Goal: Complications related to  disease process, condition or treatment will be avoided or minimized Outcome: Completed/Met   Problem: Education: Goal: Ability to describe self-care measures that may prevent or decrease complications (Diabetes Survival Skills Education) will improve Outcome: Completed/Met Goal: Individualized Educational Video(s) Outcome: Completed/Met   Problem: Coping: Goal: Ability to adjust to condition or change in health will improve Outcome: Completed/Met   Problem: Fluid Volume: Goal: Ability to maintain a balanced intake and output will improve Outcome: Completed/Met   Problem: Health Behavior/Discharge Planning: Goal: Ability to identify and utilize available resources and services will improve Outcome: Completed/Met Goal: Ability to manage health-related needs will improve Outcome: Completed/Met   Problem: Metabolic: Goal: Ability to maintain appropriate glucose levels will improve Outcome: Completed/Met   Problem: Nutritional: Goal: Maintenance of adequate nutrition will improve Outcome: Completed/Met Goal: Progress toward achieving an optimal weight will improve Outcome: Completed/Met   Problem: Skin Integrity: Goal: Risk for impaired skin integrity will decrease Outcome: Completed/Met   Problem: Tissue Perfusion: Goal: Adequacy of tissue perfusion will improve Outcome: Completed/Met   Problem: Education: Goal: Knowledge of condition will improve Outcome: Completed/Met Goal: Individualized Educational Video(s) Outcome: Completed/Met Goal: Individualized Newborn Educational Video(s) Outcome: Completed/Met   Problem: Activity: Goal: Will verbalize the importance of balancing activity with adequate rest periods Outcome: Completed/Met Goal: Ability to tolerate increased activity will improve Outcome: Completed/Met   Problem: Coping: Goal: Ability to identify and utilize available resources and services will improve Outcome: Completed/Met

## 2024-01-13 NOTE — Lactation Note (Signed)
 This note was copied from a baby's chart. Lactation Consultation Note  Patient Name: Tammy Mcgrath [id number] Date: 01/13/2024 Age:41 hours  Reason for consult: Follow-up assessment;Exclusive pumping and bottle feeding;Early term 37-38.6wks  P3, [redacted]w[redacted]d, 6.77 % weight loss  Mother feeding plan is to formula feed and pump to give expressed milk. Mother has a hand's free pump and unsure how it works. The breast pump is at home and mother just found out that baby baby is discharged and they can go home today. Referred parent to [employer] site for instructions. Discussed pumping 8 times per day to establish and maintain her milk supply.  Mom made aware of O/P services, breastfeeding support groups, community resources, and our phone # for post-discharge questions.     Feeding Mother's Current Feeding Choice: Breast Milk and Formula Nipple Type: Nfant Standard Flow (white)     Interventions Interventions: Education  Discharge Discharge Education: Engorgement and breast care;Warning signs for feeding baby Pump: Hands Free (Mom Cozy)  Consult Status Consult Status: Complete Date: 01/13/24    Tammy Mcgrath 01/13/2024, 2:30 PM

## 2024-01-15 LAB — SURGICAL PATHOLOGY

## 2024-01-16 ENCOUNTER — Encounter: Admitting: Family Medicine

## 2024-01-21 ENCOUNTER — Telehealth (HOSPITAL_COMMUNITY): Payer: Self-pay | Admitting: *Deleted

## 2024-01-21 NOTE — Telephone Encounter (Addendum)
 01/21/2024  Name: Tammy Mcgrath MRN: [id number] DOB: 1980-08-22  Reason for Call:  Transition of Care Hospital Discharge Call  Contact Status: Patient Contact Status:  (incomplete, call dropped during conversation)  Language assistant needed: Interpreter Mode: Interpreter Not Needed        Follow-Up Questions: Do You Have Any Concerns About Your Health As You Heal From Delivery?: Yes What Concerns Do You Have About Your Health?: She notes having intermittent headaches and some high blood pressures. "Sometimes really high, but not bad enough to go to the hospital". Not enrolled in Baby Scripts program.  Postpartum appt scheduled for 7/10/205. Call dropped before I could ask more questions.  Attempted to call two more times - both times call went to voice mail and I got message saying voice mail box full.  Sent secure chat to Dr. Cathyann Cobia, attending provider on-call to ask if patient should be seen sooner.  Also called number listed for [name] (significant other) and left a voice mail.  He called back and said he is home with her now and that she fell asleep, but that she told him before napping that she needed to call for an appointment. He said that patient has expressed concerns about her blood pressures and her iron  levels.  Advised to please call OB office and ask to be seen for BP check and to discuss concern about iron  level. Do You Have Any Concerns About Your Infants Health?: Yes What Concerns Do You Have About Your Baby?: She reports that baby has breath sounds like "wheezy, mucousy, difficult breathing".  Also sneezes and coughs sometimes.  Says babies gums are pink and that baby is eating 2-4oz every 2.5-3 hours.  They do have to wake baby sometimes for feedings (baby born at 37wk). Denies any fevers.  Next peds appointment is June 9.  She said that her two year old had similar breathing and they were told that she would outgrow them and that child did.  Encouraged patient to call  pediatrician office to have baby's breathing checked.  Also advised to call 911 or go the nearest emergency room if baby's color changes, or if baby exibits nasal flaring or costal retractions (described what these symptoms are).  Dr. Cathyann Cobia messaged back saying he would have office call the patient to come in this week for a blood pressure check.  Edinburgh Postnatal Depression Scale:  In the Past 7 Days:    PHQ2-9 Depression Scale:     Discharge Follow-up: Edinburgh score requires follow up?:  (call dropped before screening)  Post-discharge interventions: NA  Pearlie Bougie, RN 01/21/2024 14:19

## 2024-01-23 ENCOUNTER — Encounter: Admitting: Obstetrics and Gynecology

## 2024-01-24 ENCOUNTER — Ambulatory Visit

## 2024-01-24 VITALS — BP 127/82 | HR 90 | Ht 69.0 in | Wt 174.0 lb

## 2024-01-24 DIAGNOSIS — R5383 Other fatigue: Secondary | ICD-10-CM

## 2024-01-24 NOTE — Progress Notes (Signed)
 Patient here for PP BP check.  BP within normal limits.  Complaining of fatigue and feeling cold.  Consulted with Dr. Alto Atta.  Labs ordered for CBC and TSH.  Tammy Mcgrath

## 2024-01-25 LAB — CBC WITH DIFFERENTIAL/PLATELET
Basophils Absolute: 0 10*3/uL (ref 0.0–0.2)
Basos: 1 %
EOS (ABSOLUTE): 0.1 10*3/uL (ref 0.0–0.4)
Eos: 5 %
Hematocrit: 38.3 % (ref 34.0–46.6)
Hemoglobin: 12.3 g/dL (ref 11.1–15.9)
Immature Grans (Abs): 0 10*3/uL (ref 0.0–0.1)
Immature Granulocytes: 0 %
Lymphocytes Absolute: 1.2 10*3/uL (ref 0.7–3.1)
Lymphs: 40 %
MCH: 29.1 pg (ref 26.6–33.0)
MCHC: 32.1 g/dL (ref 31.5–35.7)
MCV: 91 fL (ref 79–97)
Monocytes Absolute: 0.3 10*3/uL (ref 0.1–0.9)
Monocytes: 10 %
Neutrophils Absolute: 1.4 10*3/uL (ref 1.4–7.0)
Neutrophils: 44 %
Platelets: 271 10*3/uL (ref 150–450)
RBC: 4.22 x10E6/uL (ref 3.77–5.28)
RDW: 18.3 % — ABNORMAL HIGH (ref 11.7–15.4)
WBC: 3.1 10*3/uL — ABNORMAL LOW (ref 3.4–10.8)

## 2024-01-25 LAB — TSH RFX ON ABNORMAL TO FREE T4: TSH: 1.33 u[IU]/mL (ref 0.450–4.500)

## 2024-01-26 ENCOUNTER — Ambulatory Visit: Payer: Self-pay | Admitting: Obstetrics and Gynecology

## 2024-01-28 NOTE — Telephone Encounter (Signed)
 Patient called back.  Made aware of message from Dr. Alto Atta.  Will repeat  CBC at next office visit.  Arrie Lares

## 2024-01-28 NOTE — Telephone Encounter (Signed)
-----   Message from Marci Setter sent at 01/26/2024 10:38 AM EDT ----- Labs appropriate however WBC count is low at 3.1. Recommend that we repeat that in 1-2 weeks to ensure that it returns to normal. Other labs normal ----- Message ----- From: Interface, [facility] Lab Results In Sent: 01/25/2024   4:36 PM EDT To: Marci Setter, MD

## 2024-01-30 ENCOUNTER — Encounter: Admitting: Obstetrics and Gynecology

## 2024-02-21 ENCOUNTER — Ambulatory Visit: Admitting: Family Medicine

## 2024-02-21 NOTE — Progress Notes (Deleted)
    Post Partum Visit Note  Tammy Mcgrath is a 43 y.o. [id number] female who presents for a postpartum visit. She is {1-10:13787} {time; units:18646} postpartum following a {method of delivery:313099}.  I have fully reviewed the prenatal and intrapartum course. The delivery was at *** gestational weeks.  Anesthesia: {anesthesia types:812}. Postpartum course has been ***. Baby is doing well***. Baby is feeding by {breastmilk/bottle:69}. Bleeding {vag bleed:12292}. Bowel function is {normal:32111}. Bladder function is {normal:32111}. Patient {is/is not:9024} sexually active. Contraception method is {contraceptive method:5051}. Postpartum depression screening: {gen negative/positive:315881}.   The pregnancy intention screening data noted above was reviewed. Potential methods of contraception were discussed. The patient elected to proceed with No data recorded.    Health Maintenance Due  Topic Date Due   Hepatitis B Vaccines (1 of 3 - 19+ 3-dose series) Never done   HPV VACCINES (1 - 3-dose SCDM series) Never done   COVID-19 Vaccine (1 - 2024-25 season) Never done    {Common ambulatory SmartLinks:19316}  Review of Systems {ros; complete:30496}  Objective:  LMP 05/09/2023    General:  {gen appearance:16600}   Breasts:  {desc; normal/abnormal/not indicated:14647}  Lungs: {lung exam:16931}  Heart:  {heart exam:5510}  Abdomen: {abdomen exam:16834}   Wound {Wound assessment:11097}  GU exam:  {desc; normal/abnormal/not indicated:14647}       Assessment:    There are no diagnoses linked to this encounter.  *** postpartum exam.   Plan:   Essential components of care per ACOG recommendations:  1.  Mood and well being: Patient with {gen negative/positive:315881} depression screening today. Reviewed local resources for support.  - Patient tobacco use? {tobacco use:25506}  - hx of drug use? {yes/no:25505}    2. Infant care and feeding:  -Patient currently breastmilk feeding?  {yes/no:25502}  -Social determinants of health (SDOH) reviewed in EPIC. No concerns***The following needs were identified***  3. Sexuality, contraception and birth spacing - Patient {DOES_DOES WNU:81435} want a pregnancy in the next year.  Desired family size is {NUMBER 1-10:22536} children.  - Reviewed reproductive life planning. Reviewed contraceptive methods based on pt preferences and effectiveness.  Patient desired {Upstream End Methods:24109} today.   - Discussed birth spacing of 18 months  4. Sleep and fatigue -Encouraged family/partner/community support of 4 hrs of uninterrupted sleep to help with mood and fatigue  5. Physical Recovery  - Discussed patients delivery and complications. She describes her labor as {description:25511} - Patient had a {CHL AMB [phone]}. Patient had a {laceration:25518} laceration. Perineal healing reviewed. Patient expressed understanding - Patient has urinary incontinence? {yes/no:25515} - Patient {ACTION; IS/IS WNU:78978602} safe to resume physical and sexual activity  6.  Health Maintenance - HM due items addressed {Yes or If no, why not?:20788} - Last pap smear  Diagnosis  Date Value Ref Range Status  07/04/2023   Final   - Negative for Intraepithelial Lesions or Malignancy (NILM)  07/04/2023 - Benign reactive/reparative changes  Final   Pap smear {done:10129} at today's visit.  -Breast Cancer screening indicated? {indicated:25516}  7. Chronic Disease/Pregnancy Condition follow up: {Follow up:25499}  - PCP follow up  Shawnee Fleet, CMA [facility], [facility]

## 2024-02-25 ENCOUNTER — Ambulatory Visit

## 2024-02-25 ENCOUNTER — Ambulatory Visit: Admitting: Obstetrics and Gynecology

## 2024-02-25 VITALS — BP 103/73 | HR 86 | Wt 173.0 lb

## 2024-02-25 DIAGNOSIS — Z8759 Personal history of other complications of pregnancy, childbirth and the puerperium: Secondary | ICD-10-CM

## 2024-02-25 DIAGNOSIS — Z0130 Encounter for examination of blood pressure without abnormal findings: Secondary | ICD-10-CM

## 2024-02-25 NOTE — Progress Notes (Signed)
 Subjective:  Tammy Mcgrath is a 43 y.o. female here for BP check.   Hypertension ROS: Patient denies any headaches, visual symptoms, RUQ/epigastric pain or other concerning symptoms.  Objective:  BP 103/73 (BP Location: Left Arm, Patient Position: Sitting, Cuff Size: Normal)   Pulse 86   Wt 173 lb (78.5 kg)   LMP 05/09/2023   Breastfeeding No   BMI 25.55 kg/m   Appearance alert, well appearing, and in no distress. General exam BP noted to be stable today in office.    Assessment:   Blood Pressure well controlled.   Plan:  Current treatment plan is effective, no change in therapy..   Upton Russey l Claude Swendsen, CMA

## 2024-02-28 ENCOUNTER — Ambulatory Visit (INDEPENDENT_AMBULATORY_CARE_PROVIDER_SITE_OTHER): Admitting: Family Medicine

## 2024-02-28 ENCOUNTER — Encounter: Payer: Self-pay | Admitting: *Deleted

## 2024-02-28 DIAGNOSIS — Z3A37 37 weeks gestation of pregnancy: Secondary | ICD-10-CM

## 2024-02-28 DIAGNOSIS — O24410 Gestational diabetes mellitus in pregnancy, diet controlled: Secondary | ICD-10-CM

## 2024-02-28 DIAGNOSIS — I10 Essential (primary) hypertension: Secondary | ICD-10-CM

## 2024-02-28 NOTE — Progress Notes (Unsigned)
 Post Partum Visit Note  Tammy Mcgrath is a 43 y.o. [id number] female who presents for a postpartum visit. She is 6 weeks postpartum following a normal spontaneous vaginal delivery.  I have fully reviewed the prenatal and intrapartum course. The delivery was at 37 gestational weeks.  Anesthesia: epidural. Postpartum course has been normal. Baby is doing well. Baby is feeding by bottle - Similac 360. Bleeding red. Bowel function is normal. Bladder function is normal. Patient is sexually active. Contraception method is IUD. Postpartum depression screening: negative.   The pregnancy intention screening data noted above was reviewed. Potential methods of contraception were discussed. The patient elected to proceed with No data recorded.   Edinburgh Postnatal Depression Scale - 02/28/24 1531       Edinburgh Postnatal Depression Scale:  In the Past 7 Days   I have been able to laugh and see the funny side of things. 0    I have looked forward with enjoyment to things. 0    I have blamed myself unnecessarily when things went wrong. 1    I have been anxious or worried for no good reason. 2    I have felt scared or panicky for no good reason. 1    Things have been getting on top of me. 2    I have been so unhappy that I have had difficulty sleeping. 0    I have felt sad or miserable. 0    I have been so unhappy that I have been crying. 0    The thought of harming myself has occurred to me. 0    Edinburgh Postnatal Depression Scale Total 6          Health Maintenance Due  Topic Date Due   Hepatitis B Vaccines (1 of 3 - 19+ 3-dose series) Never done   HPV VACCINES (1 - 3-dose SCDM series) Never done   COVID-19 Vaccine (1 - 2024-25 season) Never done    The following portions of the patient's history were reviewed and updated as appropriate: allergies, current medications, past family history, past medical history, past social history, past surgical history, and problem list.  Review of  Systems Pertinent items are noted in HPI.  Objective:  BP 125/88 (BP Location: Left Arm, Patient Position: Sitting, Cuff Size: Normal)   Pulse 79   Wt 177 lb (80.3 kg)   LMP 02/25/2024 (Exact Date)   Breastfeeding No   BMI 26.14 kg/m    General:  alert, cooperative, and no distress   Breasts:  not indicated  Lungs: clear to auscultation bilaterally  Heart:  regular rate and rhythm, S1, S2 normal, no murmur, click, rub or gallop  Abdomen: soft, non-tender; bowel sounds normal; no masses,  no organomegaly   Wound N/a  GU exam:  not indicated       Assessment:    2. Postpartum care and examination  3. Diet controlled gestational diabetes mellitus (GDM) in third trimester Check hga1c at 12 weeks  4. Essential hypertension BP controlled on procardia    Plan:   Essential components of care per ACOG recommendations:  1.  Mood and well being: Patient with negative depression screening today. Reviewed local resources for support.  - Patient tobacco use? No.   - hx of drug use? No.    2. Infant care and feeding:  -Patient currently breastmilk feeding? No.  -Social determinants of health (SDOH) reviewed in EPIC. No concerns  3. Sexuality, contraception and birth spacing - Patient does not  want a pregnancy in the next year.  - Reviewed reproductive life planning. Reviewed contraceptive methods based on pt preferences and effectiveness.  Patient desired IUD or IUS today.   - Discussed birth spacing of 18 months  4. Sleep and fatigue -Encouraged family/partner/community support of 4 hrs of uninterrupted sleep to help with mood and fatigue  5. Physical Recovery  - Discussed patients delivery and complications. She describes her labor as good. - Patient had a Vaginal, no problems at delivery.  Perineal healing reviewed. Patient expressed understanding - Patient has urinary incontinence? No. - Patient is safe to resume physical and sexual activity  6.  Health Maintenance - HM  due items addressed Yes - Last pap smear  Diagnosis  Date Value Ref Range Status  07/04/2023   Final   - Negative for Intraepithelial Lesions or Malignancy (NILM)  07/04/2023 - Benign reactive/reparative changes  Final   Pap smear not done at today's visit.    7. Chronic Disease/Pregnancy Condition follow up: Hypertension  - PCP follow up  Jacob J Stinson, DO Center for [facility], [facility]

## 2024-02-29 ENCOUNTER — Ambulatory Visit: Attending: Cardiology | Admitting: Cardiology

## 2024-02-29 ENCOUNTER — Encounter: Payer: Self-pay | Admitting: Cardiology

## 2024-02-29 VITALS — BP 128/84 | HR 79 | Ht 69.0 in | Wt 177.0 lb

## 2024-02-29 DIAGNOSIS — I10 Essential (primary) hypertension: Secondary | ICD-10-CM | POA: Diagnosis not present

## 2024-02-29 NOTE — Patient Instructions (Signed)
 Medication Instructions:  Your physician recommends that you continue on your current medications as directed. Please refer to the Current Medication list given to you today.  *If you need a refill on your cardiac medications before your next appointment, please call your pharmacy*  Follow-Up: At [facility], you and your health needs are our priority.  As part of our continuing mission to provide you with exceptional heart care, our providers are all part of one team.  This team includes your primary Cardiologist (physician) and Advanced Practice Providers or APPs (Physician Assistants and Nurse Practitioners) who all work together to provide you with the care you need, when you need it.  Your next appointment:   6 month(s)  Provider:   Thomasene Ripple, DO

## 2024-03-02 NOTE — Progress Notes (Signed)
 Cardio-Obstetrics Clinic  New Evaluation  Date:  03/02/2024   ID:  Naria, Abbey 1980/09/17, MRN [id number]  PCP:  Zarwolo, Gloria, FNP   [facility] Providers Cardiologist:  Scout Gumbs, DO  Electrophysiologist:  None       Referring MD: Zarwolo, Gloria, FNP   Chief Complaint:  I am doing ok   History of Present Illness:    Tammy Mcgrath is a 43 y.o. female [[id number]] who is being seen today for the evaluation of chronic hypertension in pregnancy at the request of Zarwolo, Gloria, FNP.   She is 6 weeks postpartum and has transitioned from breastfeeding to bottle feeding. She continues nifedipine  for hypertension and adheres to the prescribed regimen.  She denies any chest pain or shortness of breath.  Prior CV Studies Reviewed: The following studies were reviewed today:   Past Medical History:  Diagnosis Date   Atopic dermatitis 04/11/2022   Chronic hypertension    Chronic migraine without aura, with intractable migraine, so stated, with status migrainosus 03/26/2022   Depression    not a formal dx, is sad, not suicidal   Eczema    Fibroid    Gestational diabetes    Headache    Maternal iron  deficiency anemia affecting pregnancy in second trimester, antepartum 11/02/2023   Migraines    Otitis externa of both ears 12/07/2022   Sickle cell trait (HCC)    UTI (urinary tract infection)     Past Surgical History:  Procedure Laterality Date   BREAST SURGERY     reduction   DILATION AND CURETTAGE OF UTERUS        OB History     Gravida  7   Para  3   Term  3   Preterm      AB  4   Living  3      SAB  4   IAB      Ectopic      Multiple  0   Live Births  3               Current Medications: Current Meds  Medication Sig   Collagen-Vitamin C 1000-10 MG TABS Take by mouth.   Magnesium  100 MG TABS Take by mouth.   NIFEdipine  (ADALAT  CC) 60 MG 24 hr tablet Take 1 tablet (60 mg total) by mouth daily.      Allergies:   Patient has no known allergies.   Social History   Socioeconomic History   Marital status: Single    Spouse name: Not on file   Number of children: Not on file   Years of education: Not on file   Highest education level: Not on file  Occupational History   Not on file  Tobacco Use   Smoking status: Never   Smokeless tobacco: Never  Vaping Use   Vaping status: Never Used  Substance and Sexual Activity   Alcohol use: Not Currently   Drug use: Not Currently    Types: Marijuana    Comment: last was ~3 months ago   Sexual activity: Not Currently  Other Topics Concern   Not on file  Social History Narrative   Not on file   Social Drivers of Health   Financial Resource Strain: Not on file  Food Insecurity: No Food Insecurity (01/11/2024)   Hunger Vital Sign    Worried About Running Out of Food in the Last Year: Never true    Ran Out of Food in the Last Year:  Never true  Transportation Needs: No Transportation Needs (01/11/2024)   PRAPARE - Administrator, Civil Service (Medical): No    Lack of Transportation (Non-Medical): No  Physical Activity: Not on file  Stress: Not on file  Social Connections: Not on file      Family History  Problem Relation Age of Onset   Other Mother        believes she has problems, but doens't go to dr or talk about it   Hypertension Father    Stroke Father    Diabetes Father       ROS:   Please see the history of present illness.     All other systems reviewed and are negative.   Labs/EKG Reviewed:    EKG:   None today   Recent Labs: 01/12/2024: ALT 24; BUN 8; Creatinine, Ser 0.89; Potassium 3.6; Sodium 135 01/24/2024: Hemoglobin 12.3; Platelets 271; TSH 1.330   Recent Lipid Panel Lab Results  Component Value Date/Time   CHOL 199 12/06/2022 11:30 AM   TRIG 76 12/06/2022 11:30 AM   HDL 63 12/06/2022 11:30 AM   CHOLHDL 3.2 12/06/2022 11:30 AM   LDLCALC 122 (H) 12/06/2022 11:30 AM    Physical Exam:     VS:  BP 128/84   Pulse 79   Ht 5' 9 (1.753 m)   Wt 177 lb (80.3 kg)   LMP 02/25/2024 (Exact Date)   SpO2 97%   BMI 26.14 kg/m     Wt Readings from Last 3 Encounters:  02/29/24 177 lb (80.3 kg)  02/28/24 177 lb (80.3 kg)  02/25/24 173 lb (78.5 kg)     GEN:  Well nourished, well developed in no acute distress HEENT: Normal NECK: No JVD; No carotid bruits LYMPHATICS: No lymphadenopathy CARDIAC: RRR, no murmurs, rubs, gallops RESPIRATORY:  Clear to auscultation without rales, wheezing or rhonchi  ABDOMEN: Soft, non-tender, non-distended MUSCULOSKELETAL:  No edema; No deformity  SKIN: Warm and dry NEUROLOGIC:  Alert and oriented x 3 PSYCHIATRIC:  Normal affect    Risk Assessment/Risk Calculators:                  ASSESSMENT & PLAN:    Chronic Hypertension Postpartum care   Her blood pressure is at target in the office today.  No medication changes.  Continue nifedipine  60 mg daily.  Will see the patient back in 6 months at that time it will be beneficial to transition her to likely amlodipine 5 mg daily if blood pressure remains stable.  Patient Instructions  Medication Instructions:  Your physician recommends that you continue on your current medications as directed. Please refer to the Current Medication list given to you today.  *If you need a refill on your cardiac medications before your next appointment, please call your pharmacy*   Follow-Up: At [facility], you and your health needs are our priority.  As part of our continuing mission to provide you with exceptional heart care, our providers are all part of one team.  This team includes your primary Cardiologist (physician) and Advanced Practice Providers or APPs (Physician Assistants and Nurse Practitioners) who all work together to provide you with the care you need, when you need it.  Your next appointment:   6 month(s)  Provider:   Kayra Crowell, DO      Dispo:  No follow-ups on file.    Medication Adjustments/Labs and Tests Ordered: Current medicines are reviewed at length with the patient today.  Concerns  regarding medicines are outlined above.  Tests Ordered: No orders of the defined types were placed in this encounter.  Medication Changes: No orders of the defined types were placed in this encounter.

## 2024-03-13 ENCOUNTER — Ambulatory Visit: Admitting: Family Medicine

## 2024-04-03 ENCOUNTER — Encounter: Payer: Self-pay | Admitting: Family Medicine

## 2024-04-10 ENCOUNTER — Other Ambulatory Visit

## 2024-04-28 ENCOUNTER — Ambulatory Visit: Admitting: Family Medicine

## 2024-04-30 ENCOUNTER — Ambulatory Visit (INDEPENDENT_AMBULATORY_CARE_PROVIDER_SITE_OTHER): Admitting: Obstetrics & Gynecology

## 2024-04-30 VITALS — BP 126/92 | HR 81 | Wt 178.0 lb

## 2024-04-30 DIAGNOSIS — Z3202 Encounter for pregnancy test, result negative: Secondary | ICD-10-CM | POA: Diagnosis not present

## 2024-04-30 DIAGNOSIS — Z30430 Encounter for insertion of intrauterine contraceptive device: Secondary | ICD-10-CM | POA: Diagnosis not present

## 2024-04-30 LAB — POCT URINE PREGNANCY: Preg Test, Ur: NEGATIVE

## 2024-04-30 MED ORDER — LEVONORGESTREL 20 MCG/DAY IU IUD
1.0000 | INTRAUTERINE_SYSTEM | Freq: Once | INTRAUTERINE | Status: AC
  Administered 2024-04-30: 1 via INTRAUTERINE

## 2024-04-30 NOTE — Progress Notes (Unsigned)
    GYNECOLOGY OFFICE PROCEDURE NOTE  Tammy Mcgrath is a 43 y.o. [id number] here for Mirena  IUD insertion. No GYN concerns.  Last pap smear was on 2024 and was normal.  IUD Insertion Procedure Note Patient identified, informed consent performed, consent signed.   Discussed risks of irregular bleeding, cramping, infection, malpositioning or misplacement of the IUD outside the uterus which may require further procedure such as laparoscopy. Also discussed >99% contraception efficacy, increased risk of ectopic pregnancy with failure of method.   Emphasized that this did not protect against STIs, condoms recommended during all sexual encounters. Time out was performed.  Chaperone present.  Urine pregnancy test negative.  Speculum placed in the vagina.  Cervix visualized.  Cleaned with Betadine x 2.  Grasped anteriorly with a single tooth tenaculum.  Uterus sounded to 8 cm.  Mirena  IUD placed per manufacturer's recommendations.  Strings trimmed to 3 cm. Tenaculum was removed, good hemostasis noted.  Patient tolerated procedure well.   Patient was given post-procedure instructions.  She was advised to have backup contraception for one week.  Patient was also asked to check IUD strings periodically and follow up in 4 weeks for IUD check.   Eveline Lynwood MATSU, MD  Obstetrician & Gynecologist, [facility] for [facility], [facility]

## 2024-07-13 ENCOUNTER — Other Ambulatory Visit: Payer: Self-pay | Admitting: Family Medicine

## 2024-07-13 DIAGNOSIS — I10 Essential (primary) hypertension: Secondary | ICD-10-CM

## 2024-07-16 ENCOUNTER — Other Ambulatory Visit: Payer: Self-pay

## 2024-07-16 ENCOUNTER — Encounter (HOSPITAL_BASED_OUTPATIENT_CLINIC_OR_DEPARTMENT_OTHER): Payer: Self-pay

## 2024-07-16 ENCOUNTER — Emergency Department (HOSPITAL_BASED_OUTPATIENT_CLINIC_OR_DEPARTMENT_OTHER)

## 2024-07-16 ENCOUNTER — Emergency Department (HOSPITAL_BASED_OUTPATIENT_CLINIC_OR_DEPARTMENT_OTHER)
Admission: EM | Admit: 2024-07-16 | Discharge: 2024-07-16 | Disposition: A | Discharge status: Home / Self Care | Attending: Emergency Medicine | Admitting: Emergency Medicine

## 2024-07-16 DIAGNOSIS — Z79899 Other long term (current) drug therapy: Secondary | ICD-10-CM | POA: Diagnosis not present

## 2024-07-16 DIAGNOSIS — R079 Chest pain, unspecified: Secondary | ICD-10-CM

## 2024-07-16 DIAGNOSIS — R0789 Other chest pain: Secondary | ICD-10-CM | POA: Diagnosis present

## 2024-07-16 DIAGNOSIS — R0600 Dyspnea, unspecified: Secondary | ICD-10-CM | POA: Insufficient documentation

## 2024-07-16 DIAGNOSIS — I10 Essential (primary) hypertension: Secondary | ICD-10-CM | POA: Insufficient documentation

## 2024-07-16 DIAGNOSIS — R0609 Other forms of dyspnea: Secondary | ICD-10-CM

## 2024-07-16 LAB — CBC
HCT: 38.1 % (ref 36.0–46.0)
Hemoglobin: 13.4 g/dL (ref 12.0–15.0)
MCH: 30 pg (ref 26.0–34.0)
MCHC: 35.2 g/dL (ref 30.0–36.0)
MCV: 85.4 fL (ref 80.0–100.0)
Platelets: 210 K/uL (ref 150–400)
RBC: 4.46 MIL/uL (ref 3.87–5.11)
RDW: 12.8 % (ref 11.5–15.5)
WBC: 4.6 K/uL (ref 4.0–10.5)
nRBC: 0 % (ref 0.0–0.2)

## 2024-07-16 LAB — BASIC METABOLIC PANEL WITH GFR
Anion gap: 13 (ref 5–15)
BUN: 10 mg/dL (ref 6–20)
CO2: 23 mmol/L (ref 22–32)
Calcium: 9 mg/dL (ref 8.9–10.3)
Chloride: 105 mmol/L (ref 98–111)
Creatinine, Ser: 0.75 mg/dL (ref 0.44–1.00)
GFR, Estimated: >60 mL/min (ref >60)
Glucose, Bld: 109 mg/dL — ABNORMAL HIGH (ref 70–99)
Potassium: 3.2 mmol/L — ABNORMAL LOW (ref 3.5–5.1)
Sodium: 141 mmol/L (ref 135–145)

## 2024-07-16 LAB — HCG, SERUM, QUALITATIVE: Preg, Serum: NEGATIVE

## 2024-07-16 LAB — RESP PANEL BY RT-PCR (RSV, FLU A&B, COVID)  RVPGX2
Influenza A by PCR: NEGATIVE
Influenza B by PCR: NEGATIVE
Resp Syncytial Virus by PCR: NEGATIVE
SARS Coronavirus 2 by RT PCR: NEGATIVE

## 2024-07-16 LAB — TROPONIN T, HIGH SENSITIVITY: Troponin T High Sensitivity: <15 ng/L (ref 0–19)

## 2024-07-16 NOTE — ED Triage Notes (Signed)
 Intermittent L sided chest pain since yesterday. SHOB w exertion.   No obvious signs of respiratory distress in triage

## 2024-07-16 NOTE — ED Notes (Signed)
 EDP advised second trop not needed.

## 2024-07-16 NOTE — ED Notes (Signed)

## 2024-07-16 NOTE — Discharge Instructions (Addendum)
 As we discussed, your labs, chest xray, EKG and viral panel are all negative, reassuring there is no serious or life threatening condition as the cause of your symptoms.   As an incidental finding, you potassium is minimally low. This is not felt contributory to your symptoms and can be easily corrected with potassium rich foods. Information provided.   Please follow up with your doctor for recheck if symptoms persist. And return to the ED with any new or concerning symptoms at any time.

## 2024-07-16 NOTE — ED Notes (Signed)
 Pt is very anxious, especially about the monitor showing anything. Curious about the EKG and results.

## 2024-07-16 NOTE — ED Provider Notes (Signed)
 [place] [facility] Provider Note   CSN: [id number] Arrival date & time: 07/16/24  1151     Patient presents with: Chest Pain   Edwardine Minchew is a 43 y.o. female.   Patient to ED for evaluation of vague symptoms of chest discomfort that occur at random times such as with a certain movement or sometimes with a deep breath. The episodes are brief and resolve immediately. She does not associated these episodes with SOB but reports she has been more fatigued that usual, out of breath when unloading her car or picking up one of her children. No cough or fever. She is having nausea without vomiting related to food. No peripheral edema. She is treated for HTN since her last pregnancy and her Nifedipine  was recently doubled. The patient is not completely sure how long the stated symptoms have been happening.   The history is provided by the patient. No language interpreter was used.  Chest Pain      Prior to Admission medications   Medication Sig Start Date End Date Taking? Authorizing Provider  Collagen-Vitamin C 1000-10 MG TABS Take by mouth.    [provider]  Magnesium  100 MG TABS Take by mouth.    [provider]  NIFEdipine  (ADALAT  CC) 60 MG 24 hr tablet Take 1 tablet (60 mg total) by mouth daily. 01/13/24   Eveline Lynwood MATSU, MD    Allergies: Patient has no known allergies.    Review of Systems  Cardiovascular:  Positive for chest pain.    Updated Vital Signs BP 132/88   Pulse 80   Temp 98.2 F (36.8 C) (Oral)   Resp 16   SpO2 100%   Physical Exam Vitals and nursing note reviewed.  Constitutional:      Appearance: She is well-developed.  HENT:     Head: Normocephalic.     Mouth/Throat:     Mouth: Mucous membranes are moist.  Neck:     Vascular: No carotid bruit.  Cardiovascular:     Rate and Rhythm: Normal rate and regular rhythm.     Heart sounds: No murmur heard. Pulmonary:     Effort: Pulmonary effort  is normal.     Breath sounds: Normal breath sounds. No wheezing, rhonchi or rales.  Chest:     Chest wall: No tenderness.  Abdominal:     General: Bowel sounds are normal.     Palpations: Abdomen is soft.     Tenderness: There is no abdominal tenderness. There is no guarding or rebound.  Musculoskeletal:        General: Normal range of motion.     Cervical back: Normal range of motion and neck supple.     Right lower leg: No edema.     Left lower leg: No edema.  Skin:    General: Skin is warm and dry.  Neurological:     General: No focal deficit present.     Mental Status: She is alert and oriented to person, place, and time.     (all labs ordered are listed, but only abnormal results are displayed) Labs Reviewed  BASIC METABOLIC PANEL WITH GFR - Abnormal; Notable for the following components:      Result Value   Potassium 3.2 (*)    Glucose, Bld 109 (*)    All other components within normal limits  RESP PANEL BY RT-PCR (RSV, FLU A&B, COVID)  RVPGX2  CBC  HCG, SERUM, QUALITATIVE  TROPONIN T, HIGH  SENSITIVITY    EKG: EKG Interpretation Date/Time:  Wednesday July 16 2024 12:00:51 EST Ventricular Rate:  102 PR Interval:  200 QRS Duration:  87 QT Interval:  343 QTC Calculation: 447 R Axis:   62  Text Interpretation: Sinus tachycardia RAE, consider biatrial enlargement Confirmed by Pamella Sharper [id number]) on 07/16/2024 2:18:14 PM  Radiology: DG Chest 2 View Result Date: 07/16/2024 CLINICAL DATA:  Left chest pain and shortness of breath with exertion. EXAM: CHEST - 2 VIEW COMPARISON:  None Available. FINDINGS: The heart size and mediastinal contours are within normal limits. There is no evidence of pulmonary edema, consolidation, pneumothorax, nodule or pleural fluid. The visualized skeletal structures are unremarkable. IMPRESSION: No active cardiopulmonary disease. Electronically Signed   By: Marcey Moan M.D.   On: 07/16/2024 13:14     Procedures   Medications  Ordered in the ED - No data to display  Clinical Course as of 07/16/24 1624  Wed Jul 16, 2024  1415 Patient c/o random occurrences of chest discomfort to left chest behind the breast, brief in duration. She has unrelated fatigue and SOB and nausea that is related to food without vomiting. No fever, congestion.   Labs unremarkable with the exception of mild hypokalemia at 3.2. Troponin is negative. EKG sinus without ischemia. CXR clear. Doubt ACS, PNA, PTX, CHF. Will add on viral swab. Consider her recent change in Nifedipine  as contributory.  [SU]  1623 Viral panel negative. Suggest dietary potassium supplementation, PCP follow up. VSS. She is felt appropriate for discharge home.  [SU]    Clinical Course User Index [SU] Odell Balls, PA-C                                 Medical Decision Making Amount and/or Complexity of Data Reviewed Labs: ordered. Radiology: ordered.        Final diagnoses:  Nonspecific chest pain  Dyspnea on exertion    ED Discharge Orders     None          Odell Balls, PA-C 07/16/24 1624    Pamella Sharper LABOR, DO 07/17/24 1546

## 2024-07-30 ENCOUNTER — Ambulatory Visit: Payer: Self-pay | Admitting: Internal Medicine

## 2024-07-30 ENCOUNTER — Encounter: Payer: Self-pay | Admitting: Internal Medicine

## 2024-07-30 VITALS — BP 105/74 | HR 84 | Ht 69.0 in | Wt 176.2 lb

## 2024-07-30 DIAGNOSIS — I10 Essential (primary) hypertension: Secondary | ICD-10-CM

## 2024-07-30 MED ORDER — NIFEDIPINE ER 90 MG PO TB24: 90.0000 mg | ORAL_TABLET | Freq: Every day | ORAL | 3 refills | Status: AC

## 2024-07-30 NOTE — Patient Instructions (Signed)
 Please start taking Nifedipine  90 mg once daily.  Please continue to follow DASH diet and perform moderate exercise/walking at least 150 mins/week.

## 2024-07-31 NOTE — Progress Notes (Signed)
 Acute Office Visit  Subjective:    Patient ID: Tammy Mcgrath, female    DOB: [birth year], 43 y.o.   MRN: [id number]  Chief Complaint  Patient presents with   Hypertension    Santina to ED on 12/03 due to being under a lot of stress, had a death in her family and had been having chest pains. Concerns about her bp being out of whack. Chest pains no longer present.    HPI Patient is in today for follow-up of HTN.  She has been taking nifedipine  60 mg BID currently.  She has been placed on nifedipine  since her pregnancy in 2021.  Her dose of nifedipine  was increased to 60 mg BID during last pregnancy, but was later updated to 60 mg QD after delivery.  Her BP had been elevated up to 160s/90s, especially in the evening.  She went to ER due to episode of nonspecific chest pains.  Her EKG showed sinus tachycardia, but was negative for any ischemic changes.  She reports being stressed due to a death in the family.  Denies any recurrence of chest pain, dyspnea or palpitations in the last 1 week.  Her BP was WNL today.  Of note, she reports taking nifedipine  60 mg last evening and again this morning.  She decided to take evening dose as her BP was elevated last evening.  She denies any headache, dizziness, chest pain, dyspnea or palpitations currently.  Past Medical History:  Diagnosis Date   Atopic dermatitis 04/11/2022   Chronic hypertension    Chronic migraine without aura, with intractable migraine, so stated, with status migrainosus 03/26/2022   Depression    not a formal dx, is sad, not suicidal   Eczema    Fibroid    Gestational diabetes    Headache    Maternal iron  deficiency anemia affecting pregnancy in second trimester, antepartum 11/02/2023   Migraines    Otitis externa of both ears 12/07/2022   Sickle cell trait    UTI (urinary tract infection)     Past Surgical History:  Procedure Laterality Date   BREAST SURGERY     reduction   DILATION AND CURETTAGE OF UTERUS       Family History  Problem Relation Age of Onset   Other Mother        believes she has problems, but doens't go to dr or talk about it   Hypertension Father    Stroke Father    Diabetes Father     Social History   Socioeconomic History   Marital status: Single    Spouse name: Not on file   Number of children: Not on file   Years of education: Not on file   Highest education level: Not on file  Occupational History   Not on file  Tobacco Use   Smoking status: Never   Smokeless tobacco: Never  Vaping Use   Vaping status: Never Used  Substance and Sexual Activity   Alcohol use: Not Currently   Drug use: Not Currently    Types: Marijuana    Comment: last was ~3 months ago   Sexual activity: Not Currently  Other Topics Concern   Not on file  Social History Narrative   Not on file   Social Drivers of Health   Tobacco Use: Low Risk (07/30/2024)   Patient History    Smoking Tobacco Use: Never    Smokeless Tobacco Use: Never    Passive Exposure: Not on file  Financial Resource Strain:  Not on file  Food Insecurity: No Food Insecurity (01/11/2024)   Hunger Vital Sign    Worried About Running Out of Food in the Last Year: Never true    Ran Out of Food in the Last Year: Never true  Transportation Needs: No Transportation Needs (01/11/2024)   PRAPARE - Administrator, Civil Service (Medical): No    Lack of Transportation (Non-Medical): No  Physical Activity: Not on file  Stress: Not on file  Social Connections: Not on file  Intimate Partner Violence: Not At Risk (01/11/2024)   Humiliation, Afraid, Rape, and Kick questionnaire    Fear of Current or Ex-Partner: No    Emotionally Abused: No    Physically Abused: No    Sexually Abused: No  Depression (PHQ2-9): Low Risk (07/30/2024)   Depression (PHQ2-9)    PHQ-2 Score: 0  Alcohol Screen: Not on file  Housing: Low Risk (01/12/2024)   Housing Stability Vital Sign    Unable to Pay for Housing in the Last Year:  No    Number of Times Moved in the Last Year: 0    Homeless in the Last Year: No  Utilities: Not At Risk (01/11/2024)   [facility] Utilities    Threatened with loss of utilities: No  Health Literacy: Not on file    Outpatient Medications Prior to Visit  Medication Sig Dispense Refill   Collagen-Vitamin C 1000-10 MG TABS Take by mouth.     Magnesium  100 MG TABS Take by mouth.     NIFEdipine  (ADALAT  CC) 60 MG 24 hr tablet Take 1 tablet (60 mg total) by mouth daily. 60 tablet 1   No facility-administered medications prior to visit.    Allergies[1]  Review of Systems  Constitutional:  Negative for chills and fever.  HENT:  Negative for congestion, sinus pressure, sinus pain and sore throat.   Eyes:  Negative for pain and discharge.  Respiratory:  Negative for cough and shortness of breath.   Cardiovascular:  Negative for chest pain and palpitations.  Gastrointestinal:  Negative for abdominal pain, diarrhea, nausea and vomiting.  Endocrine: Negative for polydipsia and polyuria.  Genitourinary:  Negative for dysuria and hematuria.  Musculoskeletal:  Negative for neck pain and neck stiffness.  Skin:  Negative for rash.  Neurological:  Negative for dizziness and weakness.  Psychiatric/Behavioral:  Negative for agitation and behavioral problems.        Objective:    Physical Exam Vitals reviewed.  Constitutional:      General: She is not in acute distress.    Appearance: She is not diaphoretic.  HENT:     Head: Normocephalic and atraumatic.     Nose: Nose normal.     Mouth/Throat:     Mouth: Mucous membranes are moist.  Eyes:     General: No scleral icterus.    Extraocular Movements: Extraocular movements intact.  Cardiovascular:     Rate and Rhythm: Normal rate and regular rhythm.     Heart sounds: Normal heart sounds. No murmur heard. Pulmonary:     Breath sounds: Normal breath sounds. No wheezing or rales.  Musculoskeletal:     Cervical back: Neck supple. No tenderness.      Right lower leg: No edema.     Left lower leg: No edema.  Skin:    General: Skin is warm.     Findings: No rash.  Neurological:     General: No focal deficit present.     Mental Status: She is alert  and oriented to person, place, and time.  Psychiatric:        Mood and Affect: Mood normal.        Behavior: Behavior normal.     BP 105/74   Pulse 84   Ht 5' 9 (1.753 m)   Wt 176 lb 3.2 oz (79.9 kg)   SpO2 100%   BMI 26.02 kg/m  Wt Readings from Last 3 Encounters:  07/30/24 176 lb 3.2 oz (79.9 kg)  04/30/24 178 lb (80.7 kg)  02/29/24 177 lb (80.3 kg)        Assessment & Plan:   Problem List Items Addressed This Visit       Cardiovascular and Mediastinum   Essential hypertension - Primary   BP Readings from Last 1 Encounters:  07/30/24 105/74   Well-controlled today Would prefer nifedipine  once daily dosing instead of BID -prescribed nifedipine  90 mg QD instead of 60 mg as she reports her BP remains elevated with 60 mg dose She has been evaluated by advanced hypertension clinic as well, needs to continue follow-up Counseled for compliance with the medications Advised DASH diet and moderate exercise/walking, at least 150 mins/week      Relevant Medications   NIFEdipine  (ADALAT  CC) 90 MG 24 hr tablet     Meds ordered this encounter  Medications   NIFEdipine  (ADALAT  CC) 90 MG 24 hr tablet    Sig: Take 1 tablet (90 mg total) by mouth daily.    Dispense:  30 tablet    Refill:  3     Rinaldo Macqueen MARLA Blanch, MD    [1] No Known Allergies

## 2024-07-31 NOTE — Assessment & Plan Note (Signed)
 BP Readings from Last 1 Encounters:  07/30/24 105/74   Well-controlled today Would prefer nifedipine  once daily dosing instead of BID -prescribed nifedipine  90 mg QD instead of 60 mg as she reports her BP remains elevated with 60 mg dose She has been evaluated by advanced hypertension clinic as well, needs to continue follow-up Counseled for compliance with the medications Advised DASH diet and moderate exercise/walking, at least 150 mins/week

## 2024-08-20 ENCOUNTER — Ambulatory Visit: Admitting: Family Medicine

## 2024-08-21 ENCOUNTER — Encounter: Payer: Self-pay | Admitting: Cardiology

## 2024-10-14 ENCOUNTER — Ambulatory Visit: Payer: Self-pay | Admitting: Internal Medicine
